# Patient Record
Sex: Female | Born: 1990
Health system: Southern US, Community
[De-identification: ages and names within clinical notes are randomized; demographics above are authoritative.]

## PROBLEM LIST (undated history)

## (undated) DIAGNOSIS — G809 Cerebral palsy, unspecified: Secondary | ICD-10-CM

## (undated) DIAGNOSIS — L309 Dermatitis, unspecified: Secondary | ICD-10-CM

## (undated) DIAGNOSIS — Z9989 Dependence on other enabling machines and devices: Secondary | ICD-10-CM

## (undated) DIAGNOSIS — R569 Unspecified convulsions: Secondary | ICD-10-CM

## (undated) HISTORY — PX: KNEE SURGERY: SHX244

## (undated) HISTORY — DX: Unspecified convulsions: R56.9

## (undated) HISTORY — PX: OTHER SURGICAL HISTORY: SHX169

## (undated) HISTORY — PX: HIP SURGERY: SHX245

## (undated) HISTORY — DX: Cerebral palsy, unspecified: G80.9

---

## 2015-05-11 ENCOUNTER — Encounter: Payer: Self-pay | Admitting: Family

## 2015-05-11 ENCOUNTER — Ambulatory Visit (INDEPENDENT_AMBULATORY_CARE_PROVIDER_SITE_OTHER): Payer: BC Managed Care – PPO | Admitting: Family

## 2015-05-11 VITALS — BP 124/68 | HR 108 | Temp 98.5°F | Resp 18 | Wt 99.0 lb

## 2015-05-11 DIAGNOSIS — J069 Acute upper respiratory infection, unspecified: Secondary | ICD-10-CM | POA: Diagnosis not present

## 2015-05-11 MED ORDER — CEFUROXIME AXETIL 250 MG PO TABS: 250.0000 mg | ORAL_TABLET | Freq: Two times a day (BID) | ORAL | Status: DC

## 2015-05-11 MED ORDER — PROMETHAZINE-DM 6.25-15 MG/5ML PO SYRP
5.0000 mL | ORAL_SOLUTION | Freq: Four times a day (QID) | ORAL | Status: DC | PRN
Start: 2015-05-11 — End: 2015-06-11

## 2015-05-11 NOTE — Progress Notes (Signed)
Pre visit review using our clinic review tool, if applicable. No additional management support is needed unless otherwise documented below in the visit note. 

## 2015-05-11 NOTE — Assessment & Plan Note (Signed)
Symptoms and exam consistent with acute upper respiratory infection. Start Ceftin. Start Promethazine DM as needed for cough and sleep. Continue over-the-counter medications as needed for symptom relief and supportive care. Follow up if symptoms worsen or fail to improve.

## 2015-05-11 NOTE — Patient Instructions (Signed)
Thank you for choosing [facility].  Summary/Instructions:  Your prescription(s) have been submitted to your pharmacy or been printed and provided for you. Please take as directed and contact our office if you believe you are having problem(s) with the medication(s) or have any questions.  If your symptoms worsen or fail to improve, please contact our office for further instruction, or in case of emergency go directly to the emergency room at the closest medical facility.    Upper Respiratory Infection, Adult An upper respiratory infection (URI) is also sometimes known as the common cold. The upper respiratory tract includes the nose, sinuses, throat, trachea, and bronchi. Bronchi are the airways leading to the lungs. Most people improve within 1 week, but symptoms can last up to 2 weeks. A residual cough may last even longer.  CAUSES Many different viruses can infect the tissues lining the upper respiratory tract. The tissues become irritated and inflamed and often become very moist. Mucus production is also common. A cold is contagious. You can easily spread the virus to others by oral contact. This includes kissing, sharing a glass, coughing, or sneezing. Touching your mouth or nose and then touching a surface, which is then touched by another person, can also spread the virus. SYMPTOMS  Symptoms typically develop 1 to 3 days after you come in contact with a cold virus. Symptoms vary from person to person. They may include:  Runny nose.  Sneezing.  Nasal congestion.  Sinus irritation.  Sore throat.  Loss of voice (laryngitis).  Cough.  Fatigue.  Muscle aches.  Loss of appetite.  Headache.  Low-grade fever. DIAGNOSIS  You might diagnose your own cold based on familiar symptoms, since most people get a cold 2 to 3 times a year. Your caregiver can confirm this based on your exam. Most importantly, your caregiver can check that your symptoms are not due to another  disease such as strep throat, sinusitis, pneumonia, asthma, or epiglottitis. Blood tests, throat tests, and X-rays are not necessary to diagnose a common cold, but they may sometimes be helpful in excluding other more serious diseases. Your caregiver will decide if any further tests are required. RISKS AND COMPLICATIONS  You may be at risk for a more severe case of the common cold if you smoke cigarettes, have chronic heart disease (such as heart failure) or lung disease (such as asthma), or if you have a weakened immune system. The very young and very old are also at risk for more serious infections. Bacterial sinusitis, middle ear infections, and bacterial pneumonia can complicate the common cold. The common cold can worsen asthma and chronic obstructive pulmonary disease (COPD). Sometimes, these complications can require emergency medical care and may be life-threatening. PREVENTION  The best way to protect against getting a cold is to practice good hygiene. Avoid oral or hand contact with people with cold symptoms. Wash your hands often if contact occurs. There is no clear evidence that vitamin C, vitamin E, echinacea, or exercise reduces the chance of developing a cold. However, it is always recommended to get plenty of rest and practice good nutrition. TREATMENT  Treatment is directed at relieving symptoms. There is no cure. Antibiotics are not effective, because the infection is caused by a virus, not by bacteria. Treatment may include:  Increased fluid intake. Sports drinks offer valuable electrolytes, sugars, and fluids.  Breathing heated mist or steam (vaporizer or shower).  Eating chicken soup or other clear broths, and maintaining good nutrition.  Getting plenty of   rest.  Using gargles or lozenges for comfort.  Controlling fevers with ibuprofen or acetaminophen as directed by your caregiver.  Increasing usage of your inhaler if you have asthma. Zinc gel and zinc lozenges, taken in  the first 24 hours of the common cold, can shorten the duration and lessen the severity of symptoms. Pain medicines may help with fever, muscle aches, and throat pain. A variety of non-prescription medicines are available to treat congestion and runny nose. Your caregiver can make recommendations and may suggest nasal or lung inhalers for other symptoms.  HOME CARE INSTRUCTIONS   Only take over-the-counter or prescription medicines for pain, discomfort, or fever as directed by your caregiver.  Use a warm mist humidifier or inhale steam from a shower to increase air moisture. This may keep secretions moist and make it easier to breathe.  Drink enough water and fluids to keep your urine clear or pale yellow.  Rest as needed.  Return to work when your temperature has returned to normal or as your caregiver advises. You may need to stay home longer to avoid infecting others. You can also use a face mask and careful hand washing to prevent spread of the virus. SEEK MEDICAL CARE IF:   After the first few days, you feel you are getting worse rather than better.  You need your caregiver's advice about medicines to control symptoms.  You develop chills, worsening shortness of breath, or brown or red sputum. These may be signs of pneumonia.  You develop yellow or brown nasal discharge or pain in the face, especially when you bend forward. These may be signs of sinusitis.  You develop a fever, swollen neck glands, pain with swallowing, or white areas in the back of your throat. These may be signs of strep throat. SEEK IMMEDIATE MEDICAL CARE IF:   You have a fever.  You develop severe or persistent headache, ear pain, sinus pain, or chest pain.  You develop wheezing, a prolonged cough, cough up blood, or have a change in your usual mucus (if you have chronic lung disease).  You develop sore muscles or a stiff neck. Document Released: 05/16/2001 Document Revised: 02/12/2012 Document Reviewed:  02/25/2014 Exit[employer] Patient Information 2015 [employer]. This information is not intended to replace advice given to you by your health care provider. Make sure you discuss any questions you have with your health care provider.   

## 2015-05-11 NOTE — Progress Notes (Signed)
Subjective:    Patient ID: Kerri Harris, female    DOB: [birth year], 24 y.o.   MRN: [id number]  Chief Complaint  Patient presents with  . Establish Care    x2 weeks, coughing, sore throat, congestion    HPI:  Kerri Harris is a 24 y.o. female with a PMH of seizures and cerebral palsy who presents today for an office visit to establish care.     1.) Illness - Associated symptoms of coughing, sore throat and congestion has been going on for about 2 weeks. Modifying factors include dayquil, nyquil and Hall's with minimal relief. Timing of the symptoms are worse at night. Severity of the symptoms are enough to keep her up at night. Over the course of the 2 weeks her symptoms have progressively worsened.   No Known Allergies   No outpatient prescriptions prior to visit.   No facility-administered medications prior to visit.     Past Medical History  Diagnosis Date  . Cerebral palsy   . Seizures     Childhood seizures - not since age 35     Past Surgical History  Procedure Laterality Date  . Hip surgery Bilateral   . Feet surgery Bilateral   . Knee surgery Bilateral      Family History  Problem Relation Age of Onset  . Arthritis Mother   . Hypertension Maternal Grandmother   . Diabetes Maternal Grandmother      History   Social History  . Marital Status: Single    Spouse Name: N/A  . Number of Children: 0  . Years of Education: 12   Occupational History  . Not on file.   Social History Main Topics  . Smoking status: Never Smoker   . Smokeless tobacco: Never Used  . Alcohol Use: No  . Drug Use: No  . Sexual Activity: Not on file   Other Topics Concern  . Not on file   Social History Narrative   Fun: Play and exercise   Denies religious beliefs effecting health care.     Review of Systems  Constitutional: Negative for fever and chills.  HENT: Positive for congestion and sore throat. Negative for ear pain and sinus pressure.   Respiratory:  Positive for chest tightness and shortness of breath.   Neurological: Positive for headaches.      Objective:    BP 124/68 mmHg  Pulse 108  Temp(Src) 98.5 F (36.9 C) (Oral)  Resp 18  Wt 99 lb (44.906 kg)  SpO2 99% Nursing note and vital signs reviewed.  Physical Exam  Constitutional: She is oriented to person, place, and time. She appears well-developed and well-nourished. No distress.  HENT:  Right Ear: Hearing, tympanic membrane, external ear and ear canal normal.  Left Ear: Hearing, tympanic membrane, external ear and ear canal normal.  Nose: Right sinus exhibits no maxillary sinus tenderness and no frontal sinus tenderness. Left sinus exhibits no maxillary sinus tenderness and no frontal sinus tenderness.  Mouth/Throat: Uvula is midline, oropharynx is clear and moist and mucous membranes are normal.  Cardiovascular: Normal rate, regular rhythm, normal heart sounds and intact distal pulses.   Pulmonary/Chest: Effort normal and breath sounds normal.  Neurological: She is alert and oriented to person, place, and time.  Skin: Skin is warm and dry.  Psychiatric: She has a normal mood and affect. Her behavior is normal. Judgment and thought content normal.       Assessment & Plan:   Problem List Items Addressed This Visit  Respiratory   Acute upper respiratory infection - Primary    Symptoms and exam consistent with acute upper respiratory infection. Start Ceftin. Start Promethazine DM as needed for cough and sleep. Continue over-the-counter medications as needed for symptom relief and supportive care. Follow up if symptoms worsen or fail to improve.      Relevant Medications   cefUROXime (CEFTIN) 250 MG tablet   promethazine-dextromethorphan (PROMETHAZINE-DM) 6.25-15 MG/5ML syrup

## 2015-06-11 ENCOUNTER — Encounter: Payer: Self-pay | Admitting: Family

## 2015-06-11 ENCOUNTER — Ambulatory Visit: Payer: Self-pay | Admitting: Internal Medicine

## 2015-06-11 ENCOUNTER — Other Ambulatory Visit (INDEPENDENT_AMBULATORY_CARE_PROVIDER_SITE_OTHER): Payer: BC Managed Care – PPO

## 2015-06-11 ENCOUNTER — Ambulatory Visit (INDEPENDENT_AMBULATORY_CARE_PROVIDER_SITE_OTHER): Payer: BC Managed Care – PPO | Admitting: Family

## 2015-06-11 VITALS — BP 130/72 | HR 81 | Temp 98.2°F | Resp 18 | Ht 59.0 in | Wt 99.0 lb

## 2015-06-11 DIAGNOSIS — F911 Conduct disorder, childhood-onset type: Secondary | ICD-10-CM

## 2015-06-11 DIAGNOSIS — Z742 Need for assistance at home and no other household member able to render care: Secondary | ICD-10-CM | POA: Diagnosis not present

## 2015-06-11 DIAGNOSIS — R454 Irritability and anger: Secondary | ICD-10-CM | POA: Insufficient documentation

## 2015-06-11 DIAGNOSIS — Z0000 Encounter for general adult medical examination without abnormal findings: Secondary | ICD-10-CM | POA: Insufficient documentation

## 2015-06-11 LAB — CBC
HCT: 38.8 % (ref 36.0–46.0)
Hemoglobin: 12.8 g/dL (ref 12.0–15.0)
MCHC: 32.9 g/dL (ref 30.0–36.0)
MCV: 84.6 fl (ref 78.0–100.0)
PLATELETS: 324 10*3/uL (ref 150.0–400.0)
RBC: 4.59 Mil/uL (ref 3.87–5.11)
RDW: 13.7 % (ref 11.5–15.5)
WBC: 8.3 10*3/uL (ref 4.0–10.5)

## 2015-06-11 LAB — BASIC METABOLIC PANEL
BUN: 8 mg/dL (ref 6–23)
CO2: 25 mEq/L (ref 19–32)
CREATININE: 0.59 mg/dL (ref 0.40–1.20)
Calcium: 9.7 mg/dL (ref 8.4–10.5)
Chloride: 105 mEq/L (ref 96–112)
GFR: 131.91 mL/min (ref >60.00)
Glucose, Bld: 94 mg/dL (ref 70–99)
Potassium: 3.5 mEq/L (ref 3.5–5.1)
Sodium: 138 mEq/L (ref 135–145)

## 2015-06-11 LAB — TSH: TSH: 1.69 u[IU]/mL (ref 0.35–4.50)

## 2015-06-11 NOTE — Assessment & Plan Note (Signed)
1) Anticipatory Guidance: Discussed importance of wearing a seatbelt while driving and not texting while driving; changing batteries in smoke detector at least once annually; wearing suntan lotion when outside; eating a balanced and moderate diet; getting physical activity at least 30 minutes per day.  2) Immunizations / Screenings / Labs:  Immunizations will be checked on by her mother. Due for dental exam which will be scheduled independently. All other screenings are up to date per recommendations. Obtain CBC, TSH and BMET. Obtain lipid profile when fasting.   Overall well exam and patient with cerebral palsy. Minimal risk factors for cardiovascular disease at this time. Continue current healthy choices. Follow up office visit pending lab work and follow up prevention exam in 1 year.

## 2015-06-11 NOTE — Assessment & Plan Note (Signed)
Mother describes behavioral outbursts that are most likely related to frustration. Discussed importance of patience and redirection. Refer to psychology to assist with counseling of patient and family. There is no evidence of abuse at this time.

## 2015-06-11 NOTE — Progress Notes (Signed)
Pre visit review using our clinic review tool, if applicable. No additional management support is needed unless otherwise documented below in the visit note. 

## 2015-06-11 NOTE — Assessment & Plan Note (Signed)
Family expresses concern for patient safety when they are not at home and inability to perform activities of daily living. She can perform most activities of daily living, however requires assistance in completing. There is concern for her safety and judgement making abilities when there is no one present. It would be recommended for a home aid to assist in care of the patient to optimize safety and prevent injury.

## 2015-06-11 NOTE — Progress Notes (Signed)
Subjective:    Patient ID: Kerri Harris, female    DOB: Feb 14, 1991, 24 y.o.   MRN: [id number]  Chief Complaint  Patient presents with  . CPE    not fasting    HPI:  Kerri Harris is a 24 y.o. female who presents today for an annual wellness visit.   1) Health Maintenance -   Diet - Averages 3 meals per day consisting of vegetables, meats, starches, fruits; Denies caffeine intake.   Exercise - Walks with her walker on a daily basis.    2) Preventative Exams / Immunizations:  Dental -- Due for exam  Vision -- Due for exam    Health Maintenance  Topic Date Due  . HIV Screening  05/09/2005  . PAP SMEAR  05/09/2008  . TETANUS/TDAP  05/09/2009  . INFLUENZA VACCINE  07/05/2015   3.) In home services - Baths with assistance, eats without assistance, assistance with food preparation, assistance with dressing, and toileting. Family works outside the home and there is no one there to be with her to assist her with activities of daily living. She does move around the house with the assistance of walker without additional assistance. There is concern for her safety and decision making when there is no one around.   There is no immunization history on file for this patient.  No Known Allergies   Outpatient Prescriptions Prior to Visit  Medication Sig Dispense Refill  . cefUROXime (CEFTIN) 250 MG tablet Take 1 tablet (250 mg total) by mouth 2 (two) times daily with a meal. 20 tablet 0  . promethazine-dextromethorphan (PROMETHAZINE-DM) 6.25-15 MG/5ML syrup Take 5 mLs by mouth 4 (four) times daily as needed. 118 mL 0   No facility-administered medications prior to visit.     Past Medical History  Diagnosis Date  . Cerebral palsy   . Seizures     Childhood seizures - not since age 62     Past Surgical History  Procedure Laterality Date  . Hip surgery Bilateral   . Feet surgery Bilateral   . Knee surgery Bilateral      Family History  Problem Relation Age of Onset   . Arthritis Mother   . Hypertension Maternal Grandmother   . Diabetes Maternal Grandmother      History   Social History  . Marital Status: Single    Spouse Name: N/A  . Number of Children: 0  . Years of Education: 12   Occupational History  . Not on file.   Social History Main Topics  . Smoking status: Never Smoker   . Smokeless tobacco: Never Used  . Alcohol Use: No  . Drug Use: No  . Sexual Activity: Not on file   Other Topics Concern  . Not on file   Social History Narrative   Fun: Play and exercise   Denies religious beliefs effecting health care.      Review of Systems  Constitutional: Denies fever, chills, fatigue, or significant weight gain/loss. HENT: Head: Denies headache or neck pain Ears: Denies changes in hearing, ringing in ears, earache, drainage Nose: Denies discharge, stuffiness, itching, nosebleed, sinus pain Throat: Denies sore throat, hoarseness, dry mouth, sores, thrush Eyes: Denies loss/changes in vision, pain, redness, blurry/double vision, flashing lights Cardiovascular: Denies chest pain/discomfort, tightness, palpitations, shortness of breath with activity, difficulty lying down, swelling, sudden awakening with shortness of breath Respiratory: Denies shortness of breath, cough, sputum production, wheezing Gastrointestinal: Denies dysphasia, heartburn, change in appetite, nausea, change in bowel habits, rectal  bleeding, constipation, diarrhea, yellow skin or eyes Genitourinary: Denies frequency, urgency, burning/pain, blood in urine, incontinence, change in urinary strength. Musculoskeletal: Denies muscle/joint pain,  back pain, redness or swelling of joints, trauma Lower joint stiffness secondary to cerebral palsy Skin: Denies rashes, lumps, itching, dryness, color changes, or hair/nail changes Neurological: Denies dizziness, fainting, seizures, weakness, numbness, tingling, tremor Psychiatric - Denies nervousness, stress, depression or  memory loss Behavioral outbursts Endocrine: Denies heat or cold intolerance, sweating, frequent urination, excessive thirst, changes in appetite Hematologic: Denies ease of bruising or bleeding     Objective:    BP 130/72 mmHg  Pulse 81  Temp(Src) 98.2 F (36.8 C) (Oral)  Resp 18  Ht 4\' 11"  (1.499 m)  Wt 99 lb (44.906 kg)  BMI 19.98 kg/m2  SpO2 99% Nursing note and vital signs reviewed.  Physical Exam  Constitutional: She is oriented to person, place, and time. She appears well-developed and well-nourished.  HENT:  Head: Normocephalic.  Right Ear: Hearing, tympanic membrane, external ear and ear canal normal.  Left Ear: Hearing, tympanic membrane, external ear and ear canal normal.  Nose: Nose normal.  Mouth/Throat: Uvula is midline, oropharynx is clear and moist and mucous membranes are normal.  Increased cerumen noted bilaterally  Eyes: Conjunctivae and EOM are normal. Pupils are equal, round, and reactive to light.  Neck: Neck supple. No JVD present. No tracheal deviation present. No thyromegaly present.  Cardiovascular: Normal rate, regular rhythm, normal heart sounds and intact distal pulses.   Pulmonary/Chest: Effort normal and breath sounds normal.  Abdominal: Soft. Bowel sounds are normal. She exhibits no distension and no mass. There is no tenderness. There is no rebound and no guarding.  Musculoskeletal: Normal range of motion. She exhibits no edema or tenderness.  Lower extremity muscle stiffness in bilateral knees with some limited range of motion.   Lymphadenopathy:    She has no cervical adenopathy.  Neurological: She is alert and oriented to person, place, and time. She has normal reflexes. No cranial nerve deficit. She exhibits normal muscle tone. Coordination normal.  Skin: Skin is warm and dry.  Psychiatric: She has a normal mood and affect. Her behavior is normal. Judgment and thought content normal.       Assessment & Plan:   Problem List Items  Addressed This Visit      Other   Routine general medical examination at a health care facility - Primary    1) Anticipatory Guidance: Discussed importance of wearing a seatbelt while driving and not texting while driving; changing batteries in smoke detector at least once annually; wearing suntan lotion when outside; eating a balanced and moderate diet; getting physical activity at least 30 minutes per day.  2) Immunizations / Screenings / Labs:  Immunizations will be checked on by her mother. Due for dental exam which will be scheduled independently. All other screenings are up to date per recommendations. Obtain CBC, TSH and BMET. Obtain lipid profile when fasting.   Overall well exam and patient with cerebral palsy. Minimal risk factors for cardiovascular disease at this time. Continue current healthy choices. Follow up office visit pending lab work and follow up prevention exam in 1 year.       Relevant Orders   Basic metabolic panel   CBC   TSH   Outbursts of anger    Mother describes behavioral outbursts that are most likely related to frustration. Discussed importance of patience and redirection. Refer to psychology to assist with counseling of patient and family.  There is no evidence of abuse at this time.       Relevant Orders   Ambulatory referral to Psychology   Needs assistance while at home    Family expresses concern for patient safety when they are not at home and inability to perform activities of daily living. She can perform most activities of daily living, however requires assistance in completing. There is concern for her safety and judgement making abilities when there is no one present. It would be recommended for a home aid to assist in care of the patient to optimize safety and prevent injury.

## 2015-06-11 NOTE — Patient Instructions (Signed)
Thank you for choosing [facility].  Summary/Instructions:  Please stop by the lab on the basement level of the building for your blood work. Your results will be released to MyChart (or called to you) after review, usually within 72hours after test completion. If any changes need to be made, you will be notified at that same time.  Referrals have been made during this visit. You should expect to hear back from our schedulers in about 7-10 days in regards to establishing an appointment with the specialists we discussed.   Health Maintenance Adopting a healthy lifestyle and getting preventive care can go a long way to promote health and wellness. Talk with your health care provider about what schedule of regular examinations is right for you. This is a good chance for you to check in with your provider about disease prevention and staying healthy. In between checkups, there are plenty of things you can do on your own. Experts have done a lot of research about which lifestyle changes and preventive measures are most likely to keep you healthy. Ask your health care provider for more information. WEIGHT AND DIET  Eat a healthy diet  Be sure to include plenty of vegetables, fruits, low-fat dairy products, and lean protein.  Do not eat a lot of foods high in solid fats, added sugars, or salt.  Get regular exercise. This is one of the most important things you can do for your health.  Most adults should exercise for at least 150 minutes each week. The exercise should increase your heart rate and make you sweat (moderate-intensity exercise).  Most adults should also do strengthening exercises at least twice a week. This is in addition to the moderate-intensity exercise.  Maintain a healthy weight  Body mass index (BMI) is a measurement that can be used to identify possible weight problems. It estimates body fat based on height and weight. Your health care provider can help determine your BMI  and help you achieve or maintain a healthy weight.  For females 20 years of age and older:   A BMI below 18.5 is considered underweight.  A BMI of 18.5 to 24.9 is normal.  A BMI of 25 to 29.9 is considered overweight.  A BMI of 30 and above is considered obese.  Watch levels of cholesterol and blood lipids  You should start having your blood tested for lipids and cholesterol at 25 years of age, then have this test every 5 years.  You may need to have your cholesterol levels checked more often if:  Your lipid or cholesterol levels are high.  You are older than 25 years of age.  You are at high risk for heart disease.  CANCER SCREENING   Lung Cancer  Lung cancer screening is recommended for adults 55-80 years old who are at high risk for lung cancer because of a history of smoking.  A yearly low-dose CT scan of the lungs is recommended for people who:  Currently smoke.  Have quit within the past 15 years.  Have at least a 30-pack-year history of smoking. A pack year is smoking an average of one pack of cigarettes a day for 1 year.  Yearly screening should continue until it has been 15 years since you quit.  Yearly screening should stop if you develop a health problem that would prevent you from having lung cancer treatment.  Breast Cancer  Practice breast self-awareness. This means understanding how your breasts normally appear and feel.  It also means   doing regular breast self-exams. Let your health care provider know about any changes, no matter how small.  If you are in your 20s or 30s, you should have a clinical breast exam (CBE) by a health care provider every 1-3 years as part of a regular health exam.  If you are 40 or older, have a CBE every year. Also consider having a breast X-ray (mammogram) every year.  If you have a family history of breast cancer, talk to your health care provider about genetic screening.  If you are at high risk for breast cancer,  talk to your health care provider about having an MRI and a mammogram every year.  Breast cancer gene (BRCA) assessment is recommended for women who have family members with BRCA-related cancers. BRCA-related cancers include:  Breast.  Ovarian.  Tubal.  Peritoneal cancers.  Results of the assessment will determine the need for genetic counseling and BRCA1 and BRCA2 testing. Cervical Cancer Routine pelvic examinations to screen for cervical cancer are no longer recommended for nonpregnant women who are considered low risk for cancer of the pelvic organs (ovaries, uterus, and vagina) and who do not have symptoms. A pelvic examination may be necessary if you have symptoms including those associated with pelvic infections. Ask your health care provider if a screening pelvic exam is right for you.   The Pap test is the screening test for cervical cancer for women who are considered at risk.  If you had a hysterectomy for a problem that was not cancer or a condition that could lead to cancer, then you no longer need Pap tests.  If you are older than 65 years, and you have had normal Pap tests for the past 10 years, you no longer need to have Pap tests.  If you have had past treatment for cervical cancer or a condition that could lead to cancer, you need Pap tests and screening for cancer for at least 20 years after your treatment.  If you no longer get a Pap test, assess your risk factors if they change (such as having a new sexual partner). This can affect whether you should start being screened again.  Some women have medical problems that increase their chance of getting cervical cancer. If this is the case for you, your health care provider may recommend more frequent screening and Pap tests.  The human papillomavirus (HPV) test is another test that may be used for cervical cancer screening. The HPV test looks for the virus that can cause cell changes in the cervix. The cells collected  during the Pap test can be tested for HPV.  The HPV test can be used to screen women 30 years of age and older. Getting tested for HPV can extend the interval between normal Pap tests from three to five years.  An HPV test also should be used to screen women of any age who have unclear Pap test results.  After 25 years of age, women should have HPV testing as often as Pap tests.  Colorectal Cancer  This type of cancer can be detected and often prevented.  Routine colorectal cancer screening usually begins at 25 years of age and continues through 25 years of age.  Your health care provider may recommend screening at an earlier age if you have risk factors for colon cancer.  Your health care provider may also recommend using home test kits to check for hidden blood in the stool.  A small camera at the end of a   tube can be used to examine your colon directly (sigmoidoscopy or colonoscopy). This is done to check for the earliest forms of colorectal cancer.  Routine screening usually begins at age 50.  Direct examination of the colon should be repeated every 5-10 years through 25 years of age. However, you may need to be screened more often if early forms of precancerous polyps or small growths are found. Skin Cancer  Check your skin from head to toe regularly.  Tell your health care provider about any new moles or changes in moles, especially if there is a change in a mole's shape or color.  Also tell your health care provider if you have a mole that is larger than the size of a pencil eraser.  Always use sunscreen. Apply sunscreen liberally and repeatedly throughout the day.  Protect yourself by wearing long sleeves, pants, a wide-brimmed hat, and sunglasses whenever you are outside. HEART DISEASE, DIABETES, AND HIGH BLOOD PRESSURE   Have your blood pressure checked at least every 1-2 years. High blood pressure causes heart disease and increases the risk of stroke.  If you are  between 55 years and 79 years old, ask your health care provider if you should take aspirin to prevent strokes.  Have regular diabetes screenings. This involves taking a blood sample to check your fasting blood sugar level.  If you are at a normal weight and have a low risk for diabetes, have this test once every three years after 25 years of age.  If you are overweight and have a high risk for diabetes, consider being tested at a younger age or more often. PREVENTING INFECTION  Hepatitis B  If you have a higher risk for hepatitis B, you should be screened for this virus. You are considered at high risk for hepatitis B if:  You were born in a country where hepatitis B is common. Ask your health care provider which countries are considered high risk.  Your parents were born in a high-risk country, and you have not been immunized against hepatitis B (hepatitis B vaccine).  You have HIV or AIDS.  You use needles to inject street drugs.  You live with someone who has hepatitis B.  You have had sex with someone who has hepatitis B.  You get hemodialysis treatment.  You take certain medicines for conditions, including cancer, organ transplantation, and autoimmune conditions. Hepatitis C  Blood testing is recommended for:  Everyone born from 1945 through 1965.  Anyone with known risk factors for hepatitis C. Sexually transmitted infections (STIs)  You should be screened for sexually transmitted infections (STIs) including gonorrhea and chlamydia if:  You are sexually active and are younger than 24 years of age.  You are older than 24 years of age and your health care provider tells you that you are at risk for this type of infection.  Your sexual activity has changed since you were last screened and you are at an increased risk for chlamydia or gonorrhea. Ask your health care provider if you are at risk.  If you do not have HIV, but are at risk, it may be recommended that you  take a prescription medicine daily to prevent HIV infection. This is called pre-exposure prophylaxis (PrEP). You are considered at risk if:  You are sexually active and do not regularly use condoms or know the HIV status of your partner(s).  You take drugs by injection.  You are sexually active with a partner who has HIV. Talk with   Talk with your health care provider about whether you are at high risk of being infected with HIV. If you choose to begin PrEP, you should first be tested for HIV. You should then be tested every 3 months for as long as you are taking PrEP.  PREGNANCY   If you are premenopausal and you may become pregnant, ask your health care provider about preconception counseling.  If you may become pregnant, take 400 to 800 micrograms (mcg) of folic acid every day.  If you want to prevent pregnancy, talk to your health care provider about birth control (contraception). OSTEOPOROSIS AND MENOPAUSE   Osteoporosis is a disease in which the bones lose minerals and strength with aging. This can result in serious bone fractures. Your risk for osteoporosis can be identified using a bone density scan.  If you are 65 years of age or older, or if you are at risk for osteoporosis and fractures, ask your health care provider if you should be screened.  Ask your health care provider whether you should take a calcium or vitamin D supplement to lower your risk for osteoporosis.  Menopause may have certain physical symptoms and risks.  Hormone replacement therapy may reduce some of these symptoms and risks. Talk to your health care provider about whether hormone replacement therapy is right for you.  HOME CARE INSTRUCTIONS   Schedule regular health, dental, and eye exams.  Stay current with your immunizations.   Do not use any tobacco products including cigarettes, chewing tobacco, or electronic cigarettes.  If you are pregnant, do not drink alcohol.  If you are breastfeeding, limit how  much and how often you drink alcohol.  Limit alcohol intake to no more than 1 drink per day for nonpregnant women. One drink equals 12 ounces of beer, 5 ounces of wine, or 1 ounces of hard liquor.  Do not use street drugs.  Do not share needles.  Ask your health care provider for help if you need support or information about quitting drugs.  Tell your health care provider if you often feel depressed.  Tell your health care provider if you have ever been abused or do not feel safe at home. Document Released: 06/05/2011 Document Revised: 04/06/2014 Document Reviewed: 10/22/2013 Exit[employer] Patient Information 2015 [employer]. This information is not intended to replace advice given to you by your health care provider. Make sure you discuss any questions you have with your health care provider.  

## 2015-06-14 ENCOUNTER — Telehealth: Payer: Self-pay | Admitting: Family

## 2015-06-14 NOTE — Telephone Encounter (Signed)
Please inform patient that her white/red blood cells, thyroid function, kidney function and electrolytes are all within the normal limits.

## 2015-06-15 NOTE — Telephone Encounter (Signed)
Pt aware.

## 2015-06-22 ENCOUNTER — Telehealth: Payer: Self-pay | Admitting: Family

## 2015-06-22 NOTE — Telephone Encounter (Signed)
Patient has bcbs and mcd. [facility] was referred to [facility]. They are not in network with mcd. The mother is advising that the facility we refer to needs to accept mcd as well. How would we go about [facility]?

## 2015-07-08 ENCOUNTER — Encounter: Payer: Self-pay | Admitting: Family

## 2015-07-08 ENCOUNTER — Ambulatory Visit (INDEPENDENT_AMBULATORY_CARE_PROVIDER_SITE_OTHER): Payer: BC Managed Care – PPO | Admitting: Family

## 2015-07-08 VITALS — BP 120/80 | HR 105 | Temp 98.3°F | Resp 20 | Ht 59.0 in | Wt 97.0 lb

## 2015-07-08 DIAGNOSIS — R059 Cough, unspecified: Secondary | ICD-10-CM

## 2015-07-08 DIAGNOSIS — R05 Cough: Secondary | ICD-10-CM | POA: Diagnosis not present

## 2015-07-08 MED ORDER — LEVOFLOXACIN 500 MG PO TABS
500.0000 mg | ORAL_TABLET | Freq: Every day | ORAL | Status: DC
Start: 2015-07-08 — End: 2016-07-20

## 2015-07-08 MED ORDER — FLUTICASONE FUROATE-VILANTEROL 100-25 MCG/INH IN AEPB: 1.0000 | INHALATION_SPRAY | Freq: Every day | RESPIRATORY_TRACT | Status: DC | PRN

## 2015-07-08 MED ORDER — HYDROCODONE-HOMATROPINE 5-1.5 MG/5ML PO SYRP: 5.0000 mL | ORAL_SOLUTION | Freq: Three times a day (TID) | ORAL | Status: DC | PRN

## 2015-07-08 NOTE — Patient Instructions (Signed)
Thank you for choosing [facility].  Summary/Instructions:  Your prescription(s) have been submitted to your pharmacy or been printed and provided for you. Please take as directed and contact our office if you believe you are having problem(s) with the medication(s) or have any questions.  If your symptoms worsen or fail to improve, please contact our office for further instruction, or in case of emergency go directly to the emergency room at the closest medical facility.   General Recommendations:    Please drink plenty of fluids.  Get plenty of rest   Sleep in humidified air  Use saline nasal sprays  Netti pot   OTC Medications:  Decongestants - helps relieve congestion   Flonase (generic fluticasone) or Nasacort (generic triamcinolone) - please make sure to use the "cross-over" technique at a 45 degree angle towards the opposite eye as opposed to straight up the nasal passageway.   Sudafed (generic pseudoephedrine - Note this is the one that is available behind the pharmacy counter); Products with phenylephrine (-PE) may also be used but is often not as effective as pseudoephedrine.   If you have HIGH BLOOD PRESSURE - Coricidin HBP; AVOID any product that is -D as this contains pseudoephedrine which may increase your blood pressure.  Afrin (oxymetazoline) every 6-8 hours for up to 3 days.   Allergies - helps relieve runny nose, itchy eyes and sneezing   Claritin (generic loratidine), Allegra (fexofenidine), or Zyrtec (generic cyrterizine) for runny nose. These medications should not cause drowsiness.  Note - Benadryl (generic diphenhydramine) may be used however may cause drowsiness  Cough -   Delsym or Robitussin (generic dextromethorphan)  Expectorants - helps loosen mucus to ease removal   Mucinex (generic guaifenesin) as directed on the package.  Headaches / General Aches   Tylenol (generic acetaminophen) - DO NOT EXCEED 3 grams (3,000 mg) in a 24  hour time period  Advil/Motrin (generic ibuprofen)   Sore Throat -   Salt water gargle   Chloraseptic (generic benzocaine) spray or lozenges / Sucrets (generic dyclonine)      

## 2015-07-08 NOTE — Assessment & Plan Note (Signed)
Cough of underdetermined origin cannot rule out bronchitis. Start levaquin to cover for infection. Start Breo as needed for airway inflammation. Start hycodan as needed for cough and sleep. Continue over the counter medication as needed for symptom relief and supportive care. Follow up if symptoms worsen or fail to improve.

## 2015-07-08 NOTE — Progress Notes (Signed)
   Subjective:    Patient ID: Kerri Harris, female    DOB: [birth year], 24 y.o.   MRN: [id number]  Chief Complaint  Patient presents with  . Cough    has a really bad cough and saw blood in her mucus yesterday when she was coughing, SOB, passed out over a week ago from SOB and dizziness    HPI:  Kerri Harris is a 24 y.o. female with a PMH of cerebral palsy and seizures who presents today for an acute office visit.     1.) Cough - Associated symptom of a cough has been going on for about a month.  Indicates that she saw some blood in her mucus yesterday and the severity of her symptoms of coughing and SOB resulted in her passing out.  Modifying factors include cough syrup and she was previously treated with ceftin which originally helped. Symptoms are consistent throughout the day and worse at night. Dizziness occurs when she gets headaches. Is not currently experiencing the dizziness.   No Known Allergies   No current outpatient prescriptions on file prior to visit.   No current facility-administered medications on file prior to visit.    Review of Systems  Constitutional: Negative for fever and chills.  HENT: Positive for congestion. Negative for sinus pressure and sore throat.   Respiratory: Positive for cough.   Neurological: Positive for headaches.      Objective:    BP 120/80 mmHg  Pulse 105  Temp(Src) 98.3 F (36.8 C) (Oral)  Resp 20  Ht  (1.499 m)  Wt 97 lb (43.999 kg)  BMI 19.58 kg/m2  SpO2 99% Nursing note and vital signs reviewed.  Physical Exam  Constitutional: She is oriented to person, place, and time. She appears well-developed and well-nourished. No distress.  HENT:  Right Ear: Hearing, tympanic membrane, external ear and ear canal normal.  Left Ear: Hearing, tympanic membrane, external ear and ear canal normal.  Nose: Right sinus exhibits no maxillary sinus tenderness and no frontal sinus tenderness. Left sinus exhibits no maxillary sinus  tenderness and no frontal sinus tenderness.  Mouth/Throat: Uvula is midline, oropharynx is clear and moist and mucous membranes are normal.  Cardiovascular: Normal rate, regular rhythm, normal heart sounds and intact distal pulses.   Pulmonary/Chest: Effort normal. She has wheezes (mild).  Neurological: She is alert and oriented to person, place, and time.  Skin: Skin is warm and dry.  Psychiatric: She has a normal mood and affect. Her behavior is normal. Judgment and thought content normal.       Assessment & Plan:   Problem List Items Addressed This Visit      Other   Cough - Primary    Cough of underdetermined origin cannot rule out bronchitis. Start levaquin to cover for infection. Start Breo as needed for airway inflammation. Start hycodan as needed for cough and sleep. Continue over the counter medication as needed for symptom relief and supportive care. Follow up if symptoms worsen or fail to improve.       Relevant Medications   levofloxacin (LEVAQUIN) 500 MG tablet   Fluticasone Furoate-Vilanterol (BREO ELLIPTA) 100-25 MCG/INH AEPB   HYDROcodone-homatropine (HYCODAN) 5-1.5 MG/5ML syrup

## 2015-07-08 NOTE — Assessment & Plan Note (Deleted)
Cough of underdetermined origin cannot rule out bronchitis. Start levaquin to cover for infection. Start Breo as needed for airway inflammation. Start hycodan as needed for cough and sleep. Continue over the counter medication as needed for symptom relief and supportive care. Follow up if symptoms worsen or fail to improve.  

## 2015-07-08 NOTE — Progress Notes (Signed)
Pre visit review using our clinic review tool, if applicable. No additional management support is needed unless otherwise documented below in the visit note. 

## 2015-07-15 NOTE — Telephone Encounter (Signed)
Spoke w/pts mother. Gave her ph # for [facility].

## 2015-07-15 NOTE — Telephone Encounter (Signed)
Patients mother called to advise that [facility] does not accept mcd. i advised that i would let you know to see if you had any other resources, but to check with the case worker for a possible list of in network providers with medicaid.

## 2015-11-17 ENCOUNTER — Telehealth: Payer: Self-pay

## 2015-11-17 NOTE — Telephone Encounter (Signed)
Outreach to fup on flu; mother working full time during the holidays and is only off on 26th; declines pharmacy or other and prefers to bring her in Per TL; Truddie HiddenLou ok'd the patient to be seen Saturday between 9 and 12; The mother stated she would bring her in at 10am Saturday; the 17th.

## 2015-11-20 ENCOUNTER — Ambulatory Visit: Payer: BC Managed Care – PPO | Admitting: Internal Medicine

## 2016-05-12 ENCOUNTER — Telehealth: Payer: Self-pay | Admitting: Family

## 2016-05-12 NOTE — Telephone Encounter (Signed)
Returned pts mothers call and stated that pt needs to be seen since she has not been seen since last July. Mother refused to bring her until august when she states she is due for her physical. She states that this stuff should have been done last year when she was here for her physical. She was also upset that nobody follow up with the psychologist set up. States that she needs an rx or a note that states that daughter needs home health.

## 2016-05-12 NOTE — Telephone Encounter (Signed)
Pt mother called in and said is requesting home health services. She is needing a referral for home health

## 2016-05-12 NOTE — Telephone Encounter (Signed)
Form will be completed and faxed. Will make copy for her to pick up.

## 2016-05-15 NOTE — Telephone Encounter (Signed)
LVM letting mother of pt know that per [facility] form the pt must be seen within the last 90 days in order for us to fill out form per state regulations. We do have the form filled out pt just needs and OV for form to be sent.

## 2016-07-20 ENCOUNTER — Ambulatory Visit (INDEPENDENT_AMBULATORY_CARE_PROVIDER_SITE_OTHER): Payer: Medicaid Other | Admitting: Family

## 2016-07-20 ENCOUNTER — Other Ambulatory Visit (INDEPENDENT_AMBULATORY_CARE_PROVIDER_SITE_OTHER): Payer: Medicaid Other

## 2016-07-20 ENCOUNTER — Encounter: Payer: Self-pay | Admitting: Family

## 2016-07-20 VITALS — BP 128/82 | HR 98 | Temp 98.6°F | Resp 16 | Ht 59.0 in | Wt 107.0 lb

## 2016-07-20 DIAGNOSIS — Z0000 Encounter for general adult medical examination without abnormal findings: Secondary | ICD-10-CM

## 2016-07-20 DIAGNOSIS — G809 Cerebral palsy, unspecified: Secondary | ICD-10-CM

## 2016-07-20 DIAGNOSIS — Z23 Encounter for immunization: Secondary | ICD-10-CM | POA: Diagnosis not present

## 2016-07-20 LAB — COMPREHENSIVE METABOLIC PANEL
ALT: 12 U/L (ref 0–35)
AST: 16 U/L (ref 0–37)
Albumin: 4.8 g/dL (ref 3.5–5.2)
Alkaline Phosphatase: 47 U/L (ref 39–117)
BUN: 11 mg/dL (ref 6–23)
CO2: 22 meq/L (ref 19–32)
Calcium: 10 mg/dL (ref 8.4–10.5)
Chloride: 103 mEq/L (ref 96–112)
Creatinine, Ser: 0.58 mg/dL (ref 0.40–1.20)
GFR: 133.36 mL/min (ref >60.00)
GLUCOSE: 94 mg/dL (ref 70–99)
Potassium: 3.6 mEq/L (ref 3.5–5.1)
Sodium: 135 mEq/L (ref 135–145)
Total Bilirubin: 0.4 mg/dL (ref 0.2–1.2)
Total Protein: 7.7 g/dL (ref 6.0–8.3)

## 2016-07-20 LAB — LIPID PANEL
Cholesterol: 133 mg/dL (ref 0–200)
HDL: 47.2 mg/dL (ref >39.00)
LDL Cholesterol: 67 mg/dL (ref 0–99)
NonHDL: 85.97
TRIGLYCERIDES: 94 mg/dL (ref 0.0–149.0)
Total CHOL/HDL Ratio: 3
VLDL: 18.8 mg/dL (ref 0.0–40.0)

## 2016-07-20 LAB — CBC
HCT: 38.5 % (ref 36.0–46.0)
HEMOGLOBIN: 13 g/dL (ref 12.0–15.0)
MCHC: 33.9 g/dL (ref 30.0–36.0)
MCV: 83.1 fl (ref 78.0–100.0)
PLATELETS: 295 10*3/uL (ref 150.0–400.0)
RBC: 4.63 Mil/uL (ref 3.87–5.11)
RDW: 12.6 % (ref 11.5–15.5)
WBC: 5.3 10*3/uL (ref 4.0–10.5)

## 2016-07-20 NOTE — Progress Notes (Signed)
Subjective:    Patient ID: Kerri Harris, female    DOB: [birth year], 25 y.o.   MRN: [id number]  Chief Complaint  Patient presents with  . CPE    not fasting    HPI:  Kerri Harris is a 25 y.o. female who presents today for an annual wellness visit.   1) Health Maintenance -   Diet - Averages about 3 meals per day; Using lactaid milk; No caffeine  Exercise - Walking with her walker  2) Preventative Exams / Immunizations:  Dental -- Due for exam  Vision -- Due for exam   Health Maintenance  Topic Date Due  . HIV Screening  05/09/2005  . TETANUS/TDAP  05/09/2009  . PAP SMEAR  05/10/2011  . INFLUENZA VACCINE  07/04/2016    Immunization History  Administered Date(s) Administered  . Tdap 07/20/2016    No Known Allergies   Outpatient Medications Prior to Visit  Medication Sig Dispense Refill  . Fluticasone Furoate-Vilanterol (BREO ELLIPTA) 100-25 MCG/INH AEPB Inhale 1 puff into the lungs daily as needed. 28 each 0  . HYDROcodone-homatropine (HYCODAN) 5-1.5 MG/5ML syrup Take 5 mLs by mouth every 8 (eight) hours as needed for cough. 120 mL 0  . levofloxacin (LEVAQUIN) 500 MG tablet Take 1 tablet (500 mg total) by mouth daily. 7 tablet 0   No facility-administered medications prior to visit.      Past Medical History:  Diagnosis Date  . Cerebral palsy (HCC)   . Seizures (HCC)    Childhood seizures - not since age 34     Past Surgical History:  Procedure Laterality Date  . feet surgery Bilateral   . HIP SURGERY Bilateral   . KNEE SURGERY Bilateral      Family History  Problem Relation Age of Onset  . Arthritis Mother   . Hypertension Maternal Grandmother   . Diabetes Maternal Grandmother      Social History   Social History  . Marital status: Single    Spouse name: N/A  . Number of children: 0  . Years of education: 12   Occupational History  . Not on file.   Social History Main Topics  . Smoking status: Never Smoker  . Smokeless  tobacco: Never Used  . Alcohol use No  . Drug use: No  . Sexual activity: Not on file   Other Topics Concern  . Not on file   Social History Narrative   Fun: Play and exercise   Denies religious beliefs effecting health care.      Review of Systems  Constitutional: Denies fever, chills, fatigue, or significant weight gain/loss. HENT: Head: Denies headache or neck pain Ears: Denies changes in hearing, ringing in ears, earache, drainage Nose: Denies discharge, stuffiness, itching, nosebleed, sinus pain Throat: Denies sore throat, hoarseness, dry mouth, sores, thrush Eyes: Denies loss/changes in vision, pain, redness, blurry/double vision, flashing lights Cardiovascular: Denies chest pain/discomfort, tightness, palpitations, shortness of breath with activity, difficulty lying down, swelling, sudden awakening with shortness of breath Respiratory: Denies shortness of breath, cough, sputum production, wheezing Gastrointestinal: Denies dysphasia, heartburn, change in appetite, nausea, change in bowel habits, rectal bleeding, constipation, diarrhea, yellow skin or eyes Genitourinary: Denies frequency, urgency, burning/pain, blood in urine, incontinence, change in urinary strength. Musculoskeletal: Denies muscle/joint pain, stiffness, back pain, redness or swelling of joints, trauma Skin: Denies rashes, lumps, itching, dryness, color changes, or hair/nail changes Neurological: Denies dizziness, fainting, seizures, weakness, numbness, tingling, tremor Psychiatric - Denies nervousness, stress, depression or memory loss Endocrine:  Denies heat or cold intolerance, sweating, frequent urination, excessive thirst, changes in appetite Hematologic: Denies ease of bruising or bleeding     Objective:     BP 128/82 (BP Location: Left Arm, Patient Position: Sitting, Cuff Size: Normal)   Pulse 98   Temp 98.6 F (37 C) (Oral)   Resp 16   Ht 4\' 11"  (1.499 m)   Wt 107 lb (48.5 kg)   SpO2 97%   BMI  21.61 kg/m  Nursing note and vital signs reviewed.  Physical Exam  Constitutional: She is oriented to person, place, and time. She appears well-developed and well-nourished.  HENT:  Head: Normocephalic.  Right Ear: Hearing, tympanic membrane, external ear and ear canal normal.  Left Ear: Hearing, tympanic membrane, external ear and ear canal normal.  Nose: Nose normal.  Mouth/Throat: Uvula is midline, oropharynx is clear and moist and mucous membranes are normal.  Eyes: Conjunctivae and EOM are normal. Pupils are equal, round, and reactive to light.  Neck: Neck supple. No JVD present. No tracheal deviation present. No thyromegaly present.  Cardiovascular: Normal rate, regular rhythm, normal heart sounds and intact distal pulses.   Pulmonary/Chest: Effort normal and breath sounds normal.  Abdominal: Soft. Bowel sounds are normal. She exhibits no distension and no mass. There is no tenderness. There is no rebound and no guarding.  Musculoskeletal: Normal range of motion. She exhibits no edema or tenderness.  Lymphadenopathy:    She has no cervical adenopathy.  Neurological: She is alert and oriented to person, place, and time. She has normal reflexes. No cranial nerve deficit. She exhibits normal muscle tone. Coordination normal.  Skin: Skin is warm and dry.  Psychiatric: She has a normal mood and affect. Her behavior is normal. Judgment and thought content normal.       Assessment & Plan:   Problem List Items Addressed This Visit      Nervous and Auditory   Cerebral palsy (HCC)     Other   Routine general medical examination at a health care facility - Primary    1) Anticipatory Guidance: Discussed importance of wearing a seatbelt while driving and not texting while driving; changing batteries in smoke detector at least once annually; wearing suntan lotion when outside; eating a balanced and moderate diet; getting physical activity at least 30 minutes per day.  2) Immunizations /  Screenings / Labs:  Tetanus updated today. All other immunizations are up-to-date per recommendations. Due for a dental and vision screen encouraged to be completed independently. Due for Pap smear which mother has refused at this time she is mentally handicapped. All other screenings are up-to-date per recommendations. Obtain CBC, CMET, and lipid profile.  Overall well exam with minimal risk factors for cardiovascular disease noted. Still does require assistance while at home secondary to her cerebral palsy. Continues to have limited physical mobility of her legs and hands. Continue healthy lifestyle behaviors and choices. Follow up prevention exam in 1 year and follow up office visit pending blood work as needed.       Relevant Orders   CBC (Completed)   Comprehensive metabolic panel (Completed)   Lipid panel (Completed)    Other Visit Diagnoses    Need for diphtheria-tetanus-pertussis (Tdap) vaccine, adult/adolescent       Relevant Orders   Tdap vaccine greater than or equal to 7yo IM (Completed)       I have discontinued Ms. Ruddy's levofloxacin, fluticasone furoate-vilanterol, and HYDROcodone-homatropine.   Follow-up: Return if symptoms worsen or fail  to improve.   Mauricio Po, FNP

## 2016-07-20 NOTE — Assessment & Plan Note (Addendum)
1) Anticipatory Guidance: Discussed importance of wearing a seatbelt while driving and not texting while driving; changing batteries in smoke detector at least once annually; wearing suntan lotion when outside; eating a balanced and moderate diet; getting physical activity at least 30 minutes per day.  2) Immunizations / Screenings / Labs:  Tetanus updated today. All other immunizations are up-to-date per recommendations. Due for a dental and vision screen encouraged to be completed independently. Due for Pap smear which mother has refused at this time she is mentally handicapped. All other screenings are up-to-date per recommendations. Obtain CBC, CMET, and lipid profile.  Overall well exam with minimal risk factors for cardiovascular disease noted. Still does require assistance while at home secondary to her cerebral palsy. Continues to have limited physical mobility of her legs and hands. Continue healthy lifestyle behaviors and choices. Follow up prevention exam in 1 year and follow up office visit pending blood work as needed.

## 2016-07-20 NOTE — Patient Instructions (Addendum)
Thank you for choosing [facility].  Summary/Instructions:  Please stop by the lab on the lower level of the building for your blood work. Your results will be released to [place] (or called to you) after review, usually within 72 hours after test completion. If any changes need to be made, you will be notified at that same time.  1. The lab is open from 7:30am to 5:30 pm Monday-Friday  2. No appointment is necessary  3. Fasting (if needed) is 6-8 hours after food and drink; black  coffee and water are okay   If your symptoms worsen or fail to improve, please contact our office for further instruction, or in case of emergency go directly to the emergency room at the closest medical facility.    Health Maintenance, Female Adopting a healthy lifestyle and getting preventive care can go a long way to promote health and wellness. Talk with your health care provider about what schedule of regular examinations is right for you. This is a good chance for you to check in with your provider about disease prevention and staying healthy. In between checkups, there are plenty of things you can do on your own. Experts have done a lot of research about which lifestyle changes and preventive measures are most likely to keep you healthy. Ask your health care provider for more information. WEIGHT AND DIET  Eat a healthy diet  Be sure to include plenty of vegetables, fruits, low-fat dairy products, and lean protein.  Do not eat a lot of foods high in solid fats, added sugars, or salt.  Get regular exercise. This is one of the most important things you can do for your health.  Most adults should exercise for at least 150 minutes each week. The exercise should increase your heart rate and make you sweat (moderate-intensity exercise).  Most adults should also do strengthening exercises at least twice a week. This is in addition to the moderate-intensity exercise.  Maintain a healthy weight  Body mass  index (BMI) is a measurement that can be used to identify possible weight problems. It estimates body fat based on height and weight. Your health care provider can help determine your BMI and help you achieve or maintain a healthy weight.  For females 61 years of age and older:   A BMI below 18.5 is considered underweight.  A BMI of 18.5 to 24.9 is normal.  A BMI of 25 to 29.9 is considered overweight.  A BMI of 30 and above is considered obese.  Watch levels of cholesterol and blood lipids  You should start having your blood tested for lipids and cholesterol at 26 years of age, then have this test every 5 years.  You may need to have your cholesterol levels checked more often if:  Your lipid or cholesterol levels are high.  You are older than 26 years of age.  You are at high risk for heart disease.  CANCER SCREENING   Lung Cancer  Lung cancer screening is recommended for adults 74-36 years old who are at high risk for lung cancer because of a history of smoking.  A yearly low-dose CT scan of the lungs is recommended for people who:  Currently smoke.  Have quit within the past 15 years.  Have at least a 30-pack-year history of smoking. A pack year is smoking an average of one pack of cigarettes a day for 1 year.  Yearly screening should continue until it has been 15 years since you quit.  Yearly screening should stop if you develop a health problem that would prevent you from having lung cancer treatment.  Breast Cancer  Practice breast self-awareness. This means understanding how your breasts normally appear and feel.  It also means doing regular breast self-exams. Let your health care provider know about any changes, no matter how small.  If you are in your 20s or 30s, you should have a clinical breast exam (CBE) by a health care provider every 1-3 years as part of a regular health exam.  If you are 40 or older, have a CBE every year. Also consider having a  breast X-ray (mammogram) every year.  If you have a family history of breast cancer, talk to your health care provider about genetic screening.  If you are at high risk for breast cancer, talk to your health care provider about having an MRI and a mammogram every year.  Breast cancer gene (BRCA) assessment is recommended for women who have family members with BRCA-related cancers. BRCA-related cancers include:  Breast.  Ovarian.  Tubal.  Peritoneal cancers.  Results of the assessment will determine the need for genetic counseling and BRCA1 and BRCA2 testing. Cervical Cancer Your health care provider may recommend that you be screened regularly for cancer of the pelvic organs (ovaries, uterus, and vagina). This screening involves a pelvic examination, including checking for microscopic changes to the surface of your cervix (Pap test). You may be encouraged to have this screening done every 3 years, beginning at age 21.  For women ages 30-65, health care providers may recommend pelvic exams and Pap testing every 3 years, or they may recommend the Pap and pelvic exam, combined with testing for human papilloma virus (HPV), every 5 years. Some types of HPV increase your risk of cervical cancer. Testing for HPV may also be done on women of any age with unclear Pap test results.  Other health care providers may not recommend any screening for nonpregnant women who are considered low risk for pelvic cancer and who do not have symptoms. Ask your health care provider if a screening pelvic exam is right for you.  If you have had past treatment for cervical cancer or a condition that could lead to cancer, you need Pap tests and screening for cancer for at least 20 years after your treatment. If Pap tests have been discontinued, your risk factors (such as having a new sexual partner) need to be reassessed to determine if screening should resume. Some women have medical problems that increase the chance of  getting cervical cancer. In these cases, your health care provider may recommend more frequent screening and Pap tests. Colorectal Cancer  This type of cancer can be detected and often prevented.  Routine colorectal cancer screening usually begins at 26 years of age and continues through 26 years of age.  Your health care provider may recommend screening at an earlier age if you have risk factors for colon cancer.  Your health care provider may also recommend using home test kits to check for hidden blood in the stool.  A small camera at the end of a tube can be used to examine your colon directly (sigmoidoscopy or colonoscopy). This is done to check for the earliest forms of colorectal cancer.  Routine screening usually begins at age 50.  Direct examination of the colon should be repeated every 5-10 years through 26 years of age. However, you may need to be screened more often if early forms of precancerous polyps or   small growths are found. Skin Cancer  Check your skin from head to toe regularly.  Tell your health care provider about any new moles or changes in moles, especially if there is a change in a mole's shape or color.  Also tell your health care provider if you have a mole that is larger than the size of a pencil eraser.  Always use sunscreen. Apply sunscreen liberally and repeatedly throughout the day.  Protect yourself by wearing long sleeves, pants, a wide-brimmed hat, and sunglasses whenever you are outside. HEART DISEASE, DIABETES, AND HIGH BLOOD PRESSURE   High blood pressure causes heart disease and increases the risk of stroke. High blood pressure is more likely to develop in:  People who have blood pressure in the high end of the normal range (130-139/85-89 mm Hg).  People who are overweight or obese.  People who are African American.  If you are 68-36 years of age, have your blood pressure checked every 3-5 years. If you are 64 years of age or older, have  your blood pressure checked every year. You should have your blood pressure measured twice--once when you are at a hospital or clinic, and once when you are not at a hospital or clinic. Record the average of the two measurements. To check your blood pressure when you are not at a hospital or clinic, you can use:  An automated blood pressure machine at a pharmacy.  A home blood pressure monitor.  If you are between 40 years and 57 years old, ask your health care provider if you should take aspirin to prevent strokes.  Have regular diabetes screenings. This involves taking a blood sample to check your fasting blood sugar level.  If you are at a normal weight and have a low risk for diabetes, have this test once every three years after 26 years of age.  If you are overweight and have a high risk for diabetes, consider being tested at a younger age or more often. PREVENTING INFECTION  Hepatitis B  If you have a higher risk for hepatitis B, you should be screened for this virus. You are considered at high risk for hepatitis B if:  You were born in a country where hepatitis B is common. Ask your health care provider which countries are considered high risk.  Your parents were born in a high-risk country, and you have not been immunized against hepatitis B (hepatitis B vaccine).  You have HIV or AIDS.  You use needles to inject street drugs.  You live with someone who has hepatitis B.  You have had sex with someone who has hepatitis B.  You get hemodialysis treatment.  You take certain medicines for conditions, including cancer, organ transplantation, and autoimmune conditions. Hepatitis C  Blood testing is recommended for:  Everyone born from [birth year] through 1966.  Anyone with known risk factors for hepatitis C. Sexually transmitted infections (STIs)  You should be screened for sexually transmitted infections (STIs) including gonorrhea and chlamydia if:  You are sexually active and  are younger than 26 years of age.  You are older than 26 years of age and your health care provider tells you that you are at risk for this type of infection.  Your sexual activity has changed since you were last screened and you are at an increased risk for chlamydia or gonorrhea. Ask your health care provider if you are at risk.  If you do not have HIV, but are at risk, it may be  recommended that you take a prescription medicine daily to prevent HIV infection. This is called pre-exposure prophylaxis (PrEP). You are considered at risk if:  You are sexually active and do not regularly use condoms or know the HIV status of your partner(s).  You take drugs by injection.  You are sexually active with a partner who has HIV. Talk with your health care provider about whether you are at high risk of being infected with HIV. If you choose to begin PrEP, you should first be tested for HIV. You should then be tested every 3 months for as long as you are taking PrEP.  PREGNANCY   If you are premenopausal and you may become pregnant, ask your health care provider about preconception counseling.  If you may become pregnant, take 400 to 800 micrograms (mcg) of folic acid every day.  If you want to prevent pregnancy, talk to your health care provider about birth control (contraception). OSTEOPOROSIS AND MENOPAUSE   Osteoporosis is a disease in which the bones lose minerals and strength with aging. This can result in serious bone fractures. Your risk for osteoporosis can be identified using a bone density scan.  If you are 65 years of age or older, or if you are at risk for osteoporosis and fractures, ask your health care provider if you should be screened.  Ask your health care provider whether you should take a calcium or vitamin D supplement to lower your risk for osteoporosis.  Menopause may have certain physical symptoms and risks.  Hormone replacement therapy may reduce some of these symptoms  and risks. Talk to your health care provider about whether hormone replacement therapy is right for you.  HOME CARE INSTRUCTIONS   Schedule regular health, dental, and eye exams.  Stay current with your immunizations.   Do not use any tobacco products including cigarettes, chewing tobacco, or electronic cigarettes.  If you are pregnant, do not drink alcohol.  If you are breastfeeding, limit how much and how often you drink alcohol.  Limit alcohol intake to no more than 1 drink per day for nonpregnant women. One drink equals 12 ounces of beer, 5 ounces of wine, or 1 ounces of hard liquor.  Do not use street drugs.  Do not share needles.  Ask your health care provider for help if you need support or information about quitting drugs.  Tell your health care provider if you often feel depressed.  Tell your health care provider if you have ever been abused or do not feel safe at home.   This information is not intended to replace advice given to you by your health care provider. Make sure you discuss any questions you have with your health care provider.   Document Released: 06/05/2011 Document Revised: 12/11/2014 Document Reviewed: 10/22/2013 Elsevier Interactive Patient Education 2016 [employer]vier Inc.  

## 2016-07-21 ENCOUNTER — Telehealth: Payer: Self-pay

## 2016-07-21 NOTE — Telephone Encounter (Signed)
Faxed pts [employer] forms to the number listed on the form. All forms have been sent out from office visit to the correct destination. Called to let pts mother know and that I made copies for her to come and pick up.

## 2016-11-30 ENCOUNTER — Ambulatory Visit (INDEPENDENT_AMBULATORY_CARE_PROVIDER_SITE_OTHER): Payer: BC Managed Care – PPO | Admitting: Nurse Practitioner

## 2016-11-30 ENCOUNTER — Encounter: Payer: Self-pay | Admitting: Nurse Practitioner

## 2016-11-30 ENCOUNTER — Ambulatory Visit (INDEPENDENT_AMBULATORY_CARE_PROVIDER_SITE_OTHER)
Admission: RE | Admit: 2016-11-30 | Discharge: 2016-11-30 | Disposition: A | Discharge status: Home / Self Care | Payer: BC Managed Care – PPO | Source: Ambulatory Visit | Attending: Nurse Practitioner | Admitting: Nurse Practitioner

## 2016-11-30 VITALS — BP 120/72 | HR 108 | Temp 99.0°F | Wt 102.0 lb

## 2016-11-30 DIAGNOSIS — M25531 Pain in right wrist: Secondary | ICD-10-CM | POA: Diagnosis not present

## 2016-11-30 DIAGNOSIS — M25431 Effusion, right wrist: Secondary | ICD-10-CM

## 2016-11-30 DIAGNOSIS — M8700 Idiopathic aseptic necrosis of unspecified bone: Secondary | ICD-10-CM

## 2016-11-30 DIAGNOSIS — Z23 Encounter for immunization: Secondary | ICD-10-CM | POA: Diagnosis not present

## 2016-11-30 NOTE — Progress Notes (Signed)
   Subjective:  Patient ID: Kerri Harris, female    DOB: 03/22/1991  Age: 25 y.o. MRN: [id number]  CC: Wrist Pain (right writst pain,swelling. ice compress. flu shot?)   Wrist Pain   The pain is present in the right wrist. This is a new problem. The current episode started in the past 7 days. There has been a history of trauma. The problem occurs intermittently. The problem has been gradually worsening. The quality of the pain is described as aching and sharp. Associated symptoms include joint swelling and a limited range of motion. Pertinent negatives include no fever, inability to bear weight, itching, joint locking, numbness, stiffness or tingling. The symptoms are aggravated by activity. She has tried cold and rest for the symptoms. The treatment provided mild relief.    No outpatient prescriptions prior to visit.   No facility-administered medications prior to visit.     ROS See HPI  Objective:  BP 120/72   Pulse (!) 108   Temp 99 F (37.2 C)   Wt 102 lb (46.3 kg)   LMP 11/08/2016   SpO2 98%   BMI 20.60 kg/m   BP Readings from Last 3 Encounters:  11/30/16 120/72  07/20/16 128/82  07/08/15 120/80    Wt Readings from Last 3 Encounters:  11/30/16 102 lb (46.3 kg)  07/20/16 107 lb (48.5 kg)  07/08/15 97 lb (44 kg)    Physical Exam  Constitutional: She is oriented to person, place, and time. No distress.  Musculoskeletal: She exhibits edema and tenderness.       Right wrist: She exhibits tenderness, bony tenderness and swelling. She exhibits normal range of motion, no effusion, no crepitus and no laceration.       Right hand: Normal.       Hands: Neurological: She is alert and oriented to person, place, and time.  Vitals reviewed.   Lab Results  Component Value Date   WBC 5.3 07/20/2016   HGB 13.0 07/20/2016   HCT 38.5 07/20/2016   PLT 295.0 07/20/2016   GLUCOSE 94 07/20/2016   CHOL 133 07/20/2016   TRIG 94.0 07/20/2016   HDL 47.20 07/20/2016   LDLCALC 67  07/20/2016   ALT 12 07/20/2016   AST 16 07/20/2016   NA 135 07/20/2016   K 3.6 07/20/2016   CL 103 07/20/2016   CREATININE 0.58 07/20/2016   BUN 11 07/20/2016   CO2 22 07/20/2016   TSH 1.69 06/11/2015    Patient was never admitted.  Assessment & Plan:   Kerri Harris was seen today for wrist pain.  Diagnoses and all orders for this visit:  Pain and swelling of wrist, right -     DG Wrist Complete Right; Future -     MR WRIST RIGHT WO CONTRAST; Future  Need for prophylactic vaccination and inoculation against influenza -     Flu Vaccine QUAD 36+ mos IM  Avascular necrosis (HCC) -     MR WRIST RIGHT WO CONTRAST; Future   Kerri Harris does not currently have medications on file.  No orders of the defined types were placed in this encounter.   Follow-up: No Follow-up on file.  Alysia [place] Briseidy Spark, NP

## 2016-11-30 NOTE — Progress Notes (Signed)
Pre visit review using our clinic review tool, if applicable. No additional management support is needed unless otherwise documented below in the visit note. 

## 2016-12-01 NOTE — Patient Instructions (Signed)
wear wrist splint during day and of off at night. Pending wrist MRI. Consider referral to ortho.

## 2016-12-11 ENCOUNTER — Ambulatory Visit
Admission: RE | Admit: 2016-12-11 | Discharge: 2016-12-11 | Disposition: A | Discharge status: Home / Self Care | Payer: BC Managed Care – PPO | Source: Ambulatory Visit | Attending: Nurse Practitioner | Admitting: Nurse Practitioner

## 2016-12-11 ENCOUNTER — Other Ambulatory Visit: Payer: Self-pay | Admitting: Nurse Practitioner

## 2016-12-11 DIAGNOSIS — M25531 Pain in right wrist: Principal | ICD-10-CM

## 2016-12-11 DIAGNOSIS — M25431 Effusion, right wrist: Secondary | ICD-10-CM

## 2016-12-11 DIAGNOSIS — M8700 Idiopathic aseptic necrosis of unspecified bone: Secondary | ICD-10-CM

## 2017-03-05 ENCOUNTER — Telehealth: Payer: Self-pay | Admitting: Family

## 2017-03-05 DIAGNOSIS — Z742 Need for assistance at home and no other household member able to render care: Secondary | ICD-10-CM

## 2017-03-05 DIAGNOSIS — G809 Cerebral palsy, unspecified: Secondary | ICD-10-CM

## 2017-03-05 NOTE — Telephone Encounter (Signed)
Pt mother called wanted wanting referral for a new wheelchair.

## 2017-03-06 NOTE — Telephone Encounter (Signed)
Orders for wheelchair have been placed.

## 2017-06-12 NOTE — Telephone Encounter (Signed)
Will you please write a written rx for wheelchair so I can fax to [facility]. Thank you

## 2017-06-13 NOTE — Telephone Encounter (Signed)
Rx has been faxed to [facility].

## 2017-06-13 NOTE — Telephone Encounter (Signed)
Please confirm the type of wheelchair that she is needing to order appropriately.

## 2017-06-13 NOTE — Telephone Encounter (Signed)
She needs a manual wheelchair.

## 2017-06-13 NOTE — Telephone Encounter (Signed)
Prescription written

## 2017-06-14 ENCOUNTER — Telehealth: Payer: Self-pay | Admitting: Family

## 2017-06-14 NOTE — Telephone Encounter (Signed)
Mother needs letters stating her diagnosis or medical or mental needs that she may have   Mother is trying to get her in a day program  [facility] Attn: Irving Burtonmily in the IDD dept  Fax number [phone]   She needs this fas as soon has possible

## 2017-06-15 NOTE — Telephone Encounter (Signed)
Note needs basic diagnosis. She needs help with ADL's. She is able to feed herself. She is ambulatory with a walker. Ok for note to be done on Monday.

## 2017-06-17 ENCOUNTER — Encounter: Payer: Self-pay | Admitting: Family

## 2017-06-17 NOTE — Telephone Encounter (Signed)
Letter written and printed.

## 2017-06-20 ENCOUNTER — Encounter: Payer: Self-pay | Admitting: Family

## 2017-06-20 NOTE — Telephone Encounter (Signed)
Mom stated that the letter was good but also needs to state that she is mentally disabled as well.

## 2017-06-20 NOTE — Telephone Encounter (Signed)
Mother is aware letter is ready for pick up at front desk.

## 2017-06-20 NOTE — Telephone Encounter (Signed)
New letter written.

## 2017-07-23 ENCOUNTER — Encounter: Payer: Medicaid Other | Admitting: Family

## 2017-08-07 ENCOUNTER — Encounter: Payer: Self-pay | Admitting: Family

## 2017-08-07 ENCOUNTER — Ambulatory Visit (INDEPENDENT_AMBULATORY_CARE_PROVIDER_SITE_OTHER): Payer: Medicaid Other | Admitting: Family

## 2017-08-07 ENCOUNTER — Other Ambulatory Visit (INDEPENDENT_AMBULATORY_CARE_PROVIDER_SITE_OTHER): Payer: Medicaid Other

## 2017-08-07 ENCOUNTER — Telehealth: Payer: Self-pay | Admitting: Family

## 2017-08-07 VITALS — BP 126/64 | HR 100 | Temp 98.8°F | Resp 16 | Ht 59.0 in | Wt 101.0 lb

## 2017-08-07 DIAGNOSIS — Z01419 Encounter for gynecological examination (general) (routine) without abnormal findings: Secondary | ICD-10-CM | POA: Diagnosis not present

## 2017-08-07 DIAGNOSIS — Z0000 Encounter for general adult medical examination without abnormal findings: Secondary | ICD-10-CM | POA: Diagnosis not present

## 2017-08-07 DIAGNOSIS — Z23 Encounter for immunization: Secondary | ICD-10-CM

## 2017-08-07 DIAGNOSIS — G809 Cerebral palsy, unspecified: Secondary | ICD-10-CM

## 2017-08-07 DIAGNOSIS — Z0001 Encounter for general adult medical examination with abnormal findings: Secondary | ICD-10-CM

## 2017-08-07 LAB — CBC
HCT: 39.6 % (ref 36.0–46.0)
HEMOGLOBIN: 12.9 g/dL (ref 12.0–15.0)
MCHC: 32.5 g/dL (ref 30.0–36.0)
MCV: 86.3 fl (ref 78.0–100.0)
PLATELETS: 312 10*3/uL (ref 150.0–400.0)
RBC: 4.6 Mil/uL (ref 3.87–5.11)
RDW: 12.9 % (ref 11.5–15.5)
WBC: 6.5 10*3/uL (ref 4.0–10.5)

## 2017-08-07 LAB — LIPID PANEL
CHOL/HDL RATIO: 3
Cholesterol: 139 mg/dL (ref 0–200)
HDL: 49 mg/dL (ref >39.00)
LDL Cholesterol: 61 mg/dL (ref 0–99)
NONHDL: 90.49
TRIGLYCERIDES: 148 mg/dL (ref 0.0–149.0)
VLDL: 29.6 mg/dL (ref 0.0–40.0)

## 2017-08-07 LAB — COMPREHENSIVE METABOLIC PANEL
ALK PHOS: 49 U/L (ref 39–117)
ALT: 12 U/L (ref 0–35)
AST: 14 U/L (ref 0–37)
Albumin: 4.6 g/dL (ref 3.5–5.2)
BUN: 12 mg/dL (ref 6–23)
CALCIUM: 9.6 mg/dL (ref 8.4–10.5)
CO2: 22 mEq/L (ref 19–32)
CREATININE: 0.58 mg/dL (ref 0.40–1.20)
Chloride: 105 mEq/L (ref 96–112)
GFR: 132.3 mL/min (ref >60.00)
GLUCOSE: 117 mg/dL — AB (ref 70–99)
Potassium: 3.5 mEq/L (ref 3.5–5.1)
Sodium: 138 mEq/L (ref 135–145)
TOTAL PROTEIN: 7.3 g/dL (ref 6.0–8.3)
Total Bilirubin: 0.9 mg/dL (ref 0.2–1.2)

## 2017-08-07 MED ORDER — BACLOFEN 10 MG PO TABS: 5.0000 mg | ORAL_TABLET | Freq: Three times a day (TID) | ORAL | 0 refills | Status: DC | PRN

## 2017-08-07 NOTE — Patient Instructions (Addendum)
Thank you for choosing [facility].  SUMMARY AND INSTRUCTIONS:  Please start the baclofen for the spasticity.  Continue with physical activity.  Overall doing great!  They will call with your referral to GYN  Medication:  Your prescription(s) have been submitted to your pharmacy or been printed and provided for you. Please take as directed and contact our office if you believe you are having problem(s) with the medication(s) or have any questions.  Labs:  Please stop by the lab on the lower level of the building for your blood work. Your results will be released to [facility] (or called to you) after review, usually within 72 hours after test completion. If any changes need to be made, you will be notified at that same time.  1.) The lab is open from 7:30am to 5:30 pm Monday-Friday 2.) No appointment is necessary 3.) Fasting (if needed) is 6-8 hours after food and drink; black coffee and water are okay   Follow up:  If your symptoms worsen or fail to improve, please contact our office for further instruction, or in case of emergency go directly to the emergency room at the closest medical facility.    Health Maintenance, Female Adopting a healthy lifestyle and getting preventive care can go a long way to promote health and wellness. Talk with your health care provider about what schedule of regular examinations is right for you. This is a good chance for you to check in with your provider about disease prevention and staying healthy. In between checkups, there are plenty of things you can do on your own. Experts have done a lot of research about which lifestyle changes and preventive measures are most likely to keep you healthy. Ask your health care provider for more information. Weight and diet Eat a healthy diet  Be sure to include plenty of vegetables, fruits, low-fat dairy products, and lean protein.  Do not eat a lot of foods high in solid fats, added sugars, or  salt.  Get regular exercise. This is one of the most important things you can do for your health. ? Most adults should exercise for at least 150 minutes each week. The exercise should increase your heart rate and make you sweat (moderate-intensity exercise). ? Most adults should also do strengthening exercises at least twice a week. This is in addition to the moderate-intensity exercise.  Maintain a healthy weight  Body mass index (BMI) is a measurement that can be used to identify possible weight problems. It estimates body fat based on height and weight. Your health care provider can help determine your BMI and help you achieve or maintain a healthy weight.  For females 67 years of age and older: ? A BMI below 18.5 is considered underweight. ? A BMI of 18.5 to 24.9 is normal. ? A BMI of 25 to 29.9 is considered overweight. ? A BMI of 30 and above is considered obese.  Watch levels of cholesterol and blood lipids  You should start having your blood tested for lipids and cholesterol at 27 years of age, then have this test every 5 years.  You may need to have your cholesterol levels checked more often if: ? Your lipid or cholesterol levels are high. ? You are older than 27 years of age. ? You are at high risk for heart disease.  Cancer screening Lung Cancer  Lung cancer screening is recommended for adults 68-79 years old who are at high risk for lung cancer because of a history of  smoking.  A yearly low-dose CT scan of the lungs is recommended for people who: ? Currently smoke. ? Have quit within the past 15 years. ? Have at least a 30-pack-year history of smoking. A pack year is smoking an average of one pack of cigarettes a day for 1 year.  Yearly screening should continue until it has been 15 years since you quit.  Yearly screening should stop if you develop a health problem that would prevent you from having lung cancer treatment.  Breast Cancer  Practice breast  self-awareness. This means understanding how your breasts normally appear and feel.  It also means doing regular breast self-exams. Let your health care provider know about any changes, no matter how small.  If you are in your 20s or 30s, you should have a clinical breast exam (CBE) by a health care provider every 1-3 years as part of a regular health exam.  If you are 90 or older, have a CBE every year. Also consider having a breast X-ray (mammogram) every year.  If you have a family history of breast cancer, talk to your health care provider about genetic screening.  If you are at high risk for breast cancer, talk to your health care provider about having an MRI and a mammogram every year.  Breast cancer gene (BRCA) assessment is recommended for women who have family members with BRCA-related cancers. BRCA-related cancers include: ? Breast. ? Ovarian. ? Tubal. ? Peritoneal cancers.  Results of the assessment will determine the need for genetic counseling and BRCA1 and BRCA2 testing.  Cervical Cancer Your health care provider may recommend that you be screened regularly for cancer of the pelvic organs (ovaries, uterus, and vagina). This screening involves a pelvic examination, including checking for microscopic changes to the surface of your cervix (Pap test). You may be encouraged to have this screening done every 3 years, beginning at age 50.  For women ages 87-65, health care providers may recommend pelvic exams and Pap testing every 3 years, or they may recommend the Pap and pelvic exam, combined with testing for human papilloma virus (HPV), every 5 years. Some types of HPV increase your risk of cervical cancer. Testing for HPV may also be done on women of any age with unclear Pap test results.  Other health care providers may not recommend any screening for nonpregnant women who are considered low risk for pelvic cancer and who do not have symptoms. Ask your health care provider if a  screening pelvic exam is right for you.  If you have had past treatment for cervical cancer or a condition that could lead to cancer, you need Pap tests and screening for cancer for at least 20 years after your treatment. If Pap tests have been discontinued, your risk factors (such as having a new sexual partner) need to be reassessed to determine if screening should resume. Some women have medical problems that increase the chance of getting cervical cancer. In these cases, your health care provider may recommend more frequent screening and Pap tests.  Colorectal Cancer  This type of cancer can be detected and often prevented.  Routine colorectal cancer screening usually begins at 27 years of age and continues through 27 years of age.  Your health care provider may recommend screening at an earlier age if you have risk factors for colon cancer.  Your health care provider may also recommend using home test kits to check for hidden blood in the stool.  A small camera at  the end of a tube can be used to examine your colon directly (sigmoidoscopy or colonoscopy). This is done to check for the earliest forms of colorectal cancer.  Routine screening usually begins at age 64.  Direct examination of the colon should be repeated every 5-10 years through 27 years of age. However, you may need to be screened more often if early forms of precancerous polyps or small growths are found.  Skin Cancer  Check your skin from head to toe regularly.  Tell your health care provider about any new moles or changes in moles, especially if there is a change in a mole's shape or color.  Also tell your health care provider if you have a mole that is larger than the size of a pencil eraser.  Always use sunscreen. Apply sunscreen liberally and repeatedly throughout the day.  Protect yourself by wearing long sleeves, pants, a wide-brimmed hat, and sunglasses whenever you are outside.  Heart disease, diabetes, and  high blood pressure  High blood pressure causes heart disease and increases the risk of stroke. High blood pressure is more likely to develop in: ? People who have blood pressure in the high end of the normal range (130-139/85-89 mm Hg). ? People who are overweight or obese. ? People who are African American.  If you are 78-85 years of age, have your blood pressure checked every 3-5 years. If you are 21 years of age or older, have your blood pressure checked every year. You should have your blood pressure measured twice-once when you are at a hospital or clinic, and once when you are not at a hospital or clinic. Record the average of the two measurements. To check your blood pressure when you are not at a hospital or clinic, you can use: ? An automated blood pressure machine at a pharmacy. ? A home blood pressure monitor.  If you are between 40 years and 70 years old, ask your health care provider if you should take aspirin to prevent strokes.  Have regular diabetes screenings. This involves taking a blood sample to check your fasting blood sugar level. ? If you are at a normal weight and have a low risk for diabetes, have this test once every three years after 27 years of age. ? If you are overweight and have a high risk for diabetes, consider being tested at a younger age or more often. Preventing infection Hepatitis B  If you have a higher risk for hepatitis B, you should be screened for this virus. You are considered at high risk for hepatitis B if: ? You were born in a country where hepatitis B is common. Ask your health care provider which countries are considered high risk. ? Your parents were born in a high-risk country, and you have not been immunized against hepatitis B (hepatitis B vaccine). ? You have HIV or AIDS. ? You use needles to inject street drugs. ? You live with someone who has hepatitis B. ? You have had sex with someone who has hepatitis B. ? You get hemodialysis  treatment. ? You take certain medicines for conditions, including cancer, organ transplantation, and autoimmune conditions.  Hepatitis C  Blood testing is recommended for: ? Everyone born from [birth year] through 1966. ? Anyone with known risk factors for hepatitis C.  Sexually transmitted infections (STIs)  You should be screened for sexually transmitted infections (STIs) including gonorrhea and chlamydia if: ? You are sexually active and are younger than 27 years of age. ?  You are older than 27 years of age and your health care provider tells you that you are at risk for this type of infection. ? Your sexual activity has changed since you were last screened and you are at an increased risk for chlamydia or gonorrhea. Ask your health care provider if you are at risk.  If you do not have HIV, but are at risk, it may be recommended that you take a prescription medicine daily to prevent HIV infection. This is called pre-exposure prophylaxis (PrEP). You are considered at risk if: ? You are sexually active and do not regularly use condoms or know the HIV status of your partner(s). ? You take drugs by injection. ? You are sexually active with a partner who has HIV.  Talk with your health care provider about whether you are at high risk of being infected with HIV. If you choose to begin PrEP, you should first be tested for HIV. You should then be tested every 3 months for as long as you are taking PrEP. Pregnancy  If you are premenopausal and you may become pregnant, ask your health care provider about preconception counseling.  If you may become pregnant, take 400 to 800 micrograms (mcg) of folic acid every day.  If you want to prevent pregnancy, talk to your health care provider about birth control (contraception). Osteoporosis and menopause  Osteoporosis is a disease in which the bones lose minerals and strength with aging. This can result in serious bone fractures. Your risk for osteoporosis  can be identified using a bone density scan.  If you are 56 years of age or older, or if you are at risk for osteoporosis and fractures, ask your health care provider if you should be screened.  Ask your health care provider whether you should take a calcium or vitamin D supplement to lower your risk for osteoporosis.  Menopause may have certain physical symptoms and risks.  Hormone replacement therapy may reduce some of these symptoms and risks. Talk to your health care provider about whether hormone replacement therapy is right for you. Follow these instructions at home:  Schedule regular health, dental, and eye exams.  Stay current with your immunizations.  Do not use any tobacco products including cigarettes, chewing tobacco, or electronic cigarettes.  If you are pregnant, do not drink alcohol.  If you are breastfeeding, limit how much and how often you drink alcohol.  Limit alcohol intake to no more than 1 drink per day for nonpregnant women. One drink equals 12 ounces of beer, 5 ounces of wine, or 1 ounces of hard liquor.  Do not use street drugs.  Do not share needles.  Ask your health care provider for help if you need support or information about quitting drugs.  Tell your health care provider if you often feel depressed.  Tell your health care provider if you have ever been abused or do not feel safe at home. This information is not intended to replace advice given to you by your health care provider. Make sure you discuss any questions you have with your health care provider. Document Released: 06/05/2011 Document Revised: 04/27/2016 Document Reviewed: 08/24/2015 Elsevier Interactive Patient Education  [employer].

## 2017-08-07 NOTE — Progress Notes (Signed)
Subjective:    Patient ID: Kerri Harris, female    DOB: 11/29/1991, 26 y.o.   MRN: [id number]  Chief Complaint  Patient presents with  . CPE    not fasting    HPI:  Kerri Harris is a 26 y.o. female who presents today for an annual wellness visit.   1) Health Maintenance -   Diet - Averages about 3 meals per day with snacks consisting of a regular diet; No caffeine intake.  Exercise - Walking around and pushing her chair.    2) Preventative Exams / Immunizations:  Dental -- Up to date  Vision -- Due for exam   Health Maintenance  Topic Date Due  . HIV Screening  05/09/2005  . PAP SMEAR  05/10/2011  . INFLUENZA VACCINE  07/04/2017  . TETANUS/TDAP  07/20/2026    Immunization History  Administered Date(s) Administered  . Influenza,inj,Quad PF,6+ Mos 11/30/2016, 08/07/2017  . Tdap 07/20/2016     No Known Allergies   No outpatient prescriptions prior to visit.   No facility-administered medications prior to visit.      Past Medical History:  Diagnosis Date  . Cerebral palsy (HCC)   . Seizures (HCC)    Childhood seizures - not since age 89     Past Surgical History:  Procedure Laterality Date  . feet surgery Bilateral   . HIP SURGERY Bilateral   . KNEE SURGERY Bilateral      Family History  Problem Relation Age of Onset  . Arthritis Mother   . Hypertension Maternal Grandmother   . Diabetes Maternal Grandmother      Social History   Social History  . Marital status: Single    Spouse name: N/A  . Number of children: 0  . Years of education: 12   Occupational History  . Not on file.   Social History Main Topics  . Smoking status: Never Smoker  . Smokeless tobacco: Never Used  . Alcohol use No  . Drug use: No  . Sexual activity: Not on file   Other Topics Concern  . Not on file   Social History Narrative   Fun: Play and exercise, read   Denies religious beliefs effecting health care.       Review of Systems    Constitutional: Denies fever, chills, fatigue, or significant weight gain/loss. HENT: Head: Denies headache or neck pain Ears: Denies changes in hearing, ringing in ears, earache, drainage Nose: Denies discharge, stuffiness, itching, nosebleed, sinus pain Throat: Denies sore throat, hoarseness, dry mouth, sores, thrush Eyes: Denies loss/changes in vision, pain, redness, blurry/double vision, flashing lights Cardiovascular: Denies chest pain/discomfort, tightness, palpitations, shortness of breath with activity, difficulty lying down, swelling, sudden awakening with shortness of breath Respiratory: Denies shortness of breath, cough, sputum production, wheezing Gastrointestinal: Denies dysphasia, heartburn, change in appetite, nausea, change in bowel habits, rectal bleeding, constipation, diarrhea, yellow skin or eyes Genitourinary: Denies frequency, urgency, burning/pain, blood in urine, incontinence, change in urinary strength. Musculoskeletal: Denies muscle/joint pain, stiffness, back pain, redness or swelling of joints, trauma Skin: Denies rashes, lumps, itching, dryness, color changes, or hair/nail changes Neurological: Denies dizziness, fainting, seizures, weakness, numbness, tingling, tremor Psychiatric - Denies nervousness, stress, depression or memory loss Endocrine: Denies heat or cold intolerance, sweating, frequent urination, excessive thirst, changes in appetite Hematologic: Denies ease of bruising or bleeding     Objective:     BP 126/64 (BP Location: Left Arm, Patient Position: Sitting, Cuff Size: Normal)   Pulse 100  Temp 98.8 F (37.1 C) (Oral)   Resp 16   Ht 4\' 11"  (1.499 m)   Wt 101 lb (45.8 kg)   SpO2 97%   BMI 20.40 kg/m  Nursing note and vital signs reviewed.  Physical Exam  Constitutional: She is oriented to person, place, and time. She appears well-developed and well-nourished.  HENT:  Head: Normocephalic.  Right Ear: Hearing, tympanic membrane, external  ear and ear canal normal.  Left Ear: Hearing, tympanic membrane, external ear and ear canal normal.  Nose: Nose normal.  Mouth/Throat: Uvula is midline, oropharynx is clear and moist and mucous membranes are normal.  Eyes: Pupils are equal, round, and reactive to light. Conjunctivae and EOM are normal.  Neck: Neck supple. No JVD present. No tracheal deviation present. No thyromegaly present.  Cardiovascular: Normal rate, regular rhythm, normal heart sounds and intact distal pulses.   Pulmonary/Chest: Effort normal and breath sounds normal.  Abdominal: Soft. Bowel sounds are normal. She exhibits no distension and no mass. There is no tenderness. There is no rebound and no guarding.  Musculoskeletal: Normal range of motion. She exhibits no edema or tenderness.  Increased bilateral spasticity with decreased flexion of knees noted.   Lymphadenopathy:    She has no cervical adenopathy.  Neurological: She is alert and oriented to person, place, and time. She has normal reflexes. No cranial nerve deficit. She exhibits normal muscle tone. Coordination normal.  Skin: Skin is warm and dry.  Psychiatric: She has a normal mood and affect. Her behavior is normal. Judgment and thought content normal.       Assessment & Plan:   Problem List Items Addressed This Visit      Nervous and Auditory   Cerebral palsy (HCC)    Increased spasticity of bilateral lower extremities secondary to spasm and tightness. Start baclofen as needed for spasticity. Cerebral palsy otherwise stable and functioning well. Continues to use walker and wheelchair. Lives at home and mother indicates patient has been doing well behaviorally.        Other   Well woman exam   Relevant Orders   Ambulatory referral to Gynecology   Encounter for general adult medical examination with abnormal findings - Primary    1) Anticipatory Guidance: Discussed importance of wearing a seatbelt while driving and not texting while driving;  changing batteries in smoke detector at least once annually; wearing suntan lotion when outside; eating a balanced and moderate diet; getting physical activity at least 30 minutes per day.  2) Immunizations / Screenings / Labs:  Influenza updated today. All other immunizations are up-to-date per recommendations. Due for a cervical cancer screening with referral to gynecology placed. All other screenings are up-to-date per recommendations. Obtain CBC, CMET, and lipid profile.    Overall well exam with minimal risk factors for cardiovascular disease. She eats a nutritionally adequate, balance, and varied intake and exercises regularly. Continue other healthy lifestyle behaviors and choices. Follow-up prevention exam in 1 year. Follow-up office visit for chronic conditions.      Relevant Orders   CBC (Completed)   Comprehensive metabolic panel (Completed)   Lipid panel (Completed)    Other Visit Diagnoses    Need for influenza vaccination       Relevant Orders   Flu Vaccine QUAD 36+ mos IM (Completed)       I am having Ms. Chien start on baclofen.   Meds ordered this encounter  Medications  . baclofen (LIORESAL) 10 MG tablet    Sig: Take  0.5-1 tablets (5-10 mg total) by mouth 3 (three) times daily as needed for muscle spasms.    Dispense:  90 each    Refill:  0    Order Specific Question:   Supervising Provider    Answer:   Hillard Danker A [[id number]     Follow-up: Return in about 6 months (around 02/04/2018), or if symptoms worsen or fail to improve.   Jeanine Luz, FNP

## 2017-08-07 NOTE — Assessment & Plan Note (Signed)
1) Anticipatory Guidance: Discussed importance of wearing a seatbelt while driving and not texting while driving; changing batteries in smoke detector at least once annually; wearing suntan lotion when outside; eating a balanced and moderate diet; getting physical activity at least 30 minutes per day.  2) Immunizations / Screenings / Labs:  Influenza updated today. All other immunizations are up-to-date per recommendations. Due for a cervical cancer screening with referral to gynecology placed. All other screenings are up-to-date per recommendations. Obtain CBC, CMET, and lipid profile.    Overall well exam with minimal risk factors for cardiovascular disease. She eats a nutritionally adequate, balance, and varied intake and exercises regularly. Continue other healthy lifestyle behaviors and choices. Follow-up prevention exam in 1 year. Follow-up office visit for chronic conditions.

## 2017-08-07 NOTE — Telephone Encounter (Signed)
Pt mother called stating the pharmacy had a power outage yesterday and today and did not receive the  baclofen (LIORESAL) 10 MG tablet  Please resend to same pharmacy

## 2017-08-07 NOTE — Assessment & Plan Note (Signed)
Increased spasticity of bilateral lower extremities secondary to spasm and tightness. Start baclofen as needed for spasticity. Cerebral palsy otherwise stable and functioning well. Continues to use walker and wheelchair. Lives at home and mother indicates patient has been doing well behaviorally.

## 2017-08-08 MED ORDER — BACLOFEN 10 MG PO TABS: 5.0000 mg | ORAL_TABLET | Freq: Three times a day (TID) | ORAL | 0 refills | Status: DC | PRN

## 2017-08-08 NOTE — Telephone Encounter (Signed)
Resent to [facility]../l;mb 

## 2017-08-31 ENCOUNTER — Telehealth: Payer: Self-pay | Admitting: Family

## 2017-08-31 ENCOUNTER — Other Ambulatory Visit: Payer: Self-pay | Admitting: Family

## 2017-08-31 DIAGNOSIS — Z742 Need for assistance at home and no other household member able to render care: Secondary | ICD-10-CM

## 2017-08-31 DIAGNOSIS — G809 Cerebral palsy, unspecified: Secondary | ICD-10-CM

## 2017-08-31 NOTE — Telephone Encounter (Signed)
Pt's Mother, Shanda Bumps, wanted to check on the referral to [facility] so Chestine Spore can do the new eval so pt can get more services.  Shanda Bumps stated she has not heard anything and was wanting to make sure referral was done.  Please call Shanda Bumps to let her know status.

## 2017-09-03 NOTE — Telephone Encounter (Signed)
Spoke with pts mother to let her know referral has been put in. I have faxed over [facility] directly to [facility].

## 2017-09-08 ENCOUNTER — Other Ambulatory Visit: Payer: Self-pay | Admitting: Family

## 2017-09-10 ENCOUNTER — Encounter: Payer: BC Managed Care – PPO | Admitting: Family

## 2017-09-25 ENCOUNTER — Other Ambulatory Visit: Payer: Self-pay

## 2017-09-25 ENCOUNTER — Telehealth: Payer: Self-pay

## 2017-09-25 NOTE — Telephone Encounter (Signed)
I called the patients mother and she was not happy with having to bring Kerri Harris in to see Ashleigh. Mother got defensive and did not want to make an appointment to see Apolonio Schneidersshleigh because her daughter is already an established patient. She asked who Ashleys nurse is and I told her and she said she did not want to deal with anyone except for you and for me to have her to call you, please call mother back

## 2017-09-25 NOTE — Telephone Encounter (Signed)
Pts mom dropped off form to be signed for re-evaluation for PCA. Kerri Harris is willing to sign it since pt was recently seen but pt needs to schedule and get established with Ashleigh. Will you call pts mom and get something scheduled please. Thank you.

## 2017-09-25 NOTE — Telephone Encounter (Signed)
Spoke with pts mother and informed her that pts PCS form has been signed and faxed by [place]. I also let her know that I am aware that pt was just seen by Tammy SoursGreg for a physical and I understand due to pts condition it is not easy to get to and from office visits. I just let her know that pt would need to be seen by within the next several months incase home health sent any forms over that needed to be signed by Apolonio SchneidersAshleigh. Mother understood and an appointment to transfer from Tammy SoursGreg has been made for December.

## 2017-10-24 ENCOUNTER — Other Ambulatory Visit (HOSPITAL_COMMUNITY)
Admission: RE | Admit: 2017-10-24 | Discharge: 2017-10-24 | Disposition: A | Discharge status: Home / Self Care | Payer: Medicaid Other | Source: Ambulatory Visit | Attending: Advanced Practice Midwife | Admitting: Advanced Practice Midwife

## 2017-10-24 ENCOUNTER — Ambulatory Visit (INDEPENDENT_AMBULATORY_CARE_PROVIDER_SITE_OTHER): Payer: BLUE CROSS/BLUE SHIELD | Admitting: Advanced Practice Midwife

## 2017-10-24 DIAGNOSIS — G809 Cerebral palsy, unspecified: Secondary | ICD-10-CM

## 2017-10-24 DIAGNOSIS — K649 Unspecified hemorrhoids: Secondary | ICD-10-CM

## 2017-10-24 DIAGNOSIS — Z01419 Encounter for gynecological examination (general) (routine) without abnormal findings: Secondary | ICD-10-CM | POA: Diagnosis present

## 2017-10-24 DIAGNOSIS — Z0000 Encounter for general adult medical examination without abnormal findings: Secondary | ICD-10-CM | POA: Diagnosis not present

## 2017-10-24 DIAGNOSIS — N9489 Other specified conditions associated with female genital organs and menstrual cycle: Secondary | ICD-10-CM

## 2017-10-24 LAB — HM PAP SMEAR

## 2017-10-24 NOTE — Patient Instructions (Signed)

## 2017-10-24 NOTE — Progress Notes (Signed)
Subjective:     Patient ID: Kerri Harris, female   DOB: 03/21/1991, 26 y.o.   MRN: [id number]  HPI; Here for well-woman exam, Pap and concern about a perineal mass that has been present for several years. Pt has CP, limited mobility, limited verbal ability. Mother is present and is her full-time care-taker. Mother also is asking if there is anything to help with her daughter's menstrual periods because the bleeding and cramping are very problematic. Has tried Depo Q12 weeks in the past without improvement. Pt is not sexually active or at risk for sexual contact per mother.   Has menstrual periods monthly, mod flow, severe dysmenorrhea. Pt is very intolerant of cramps. Pap: Never had one Mammogram: Never had one. Not indicated.  Sexual activity: Never Birth control: None  Review of Systems  Gastrointestinal: Negative for abdominal pain and blood in stool.  Genitourinary: Positive for menstrual problem. Negative for dysuria, vaginal bleeding and vaginal discharge.       Perineal mass that burns when she urinates   Past Medical History:  Diagnosis Date  . Cerebral palsy (HCC)   . Seizures (HCC)    Childhood seizures - not since age 89   Past Surgical History:  Procedure Laterality Date  . feet surgery Bilateral   . HIP SURGERY Bilateral   . KNEE SURGERY Bilateral    Family History  Problem Relation Age of Onset  . Arthritis Mother   . Hypertension Maternal Grandmother   . Diabetes Maternal Grandmother    Social History   Socioeconomic History  . Marital status: Single    Spouse name: Not on file  . Number of children: 0  . Years of education: 5912  . Highest education level: Not on file  Social Needs  . Financial resource strain: Not on file  . Food insecurity - worry: Not on file  . Food insecurity - inability: Not on file  . Transportation needs - medical: Not on file  . Transportation needs - non-medical: Not on file  Occupational History  . Not on file  Tobacco Use  .  Smoking status: Never Smoker  . Smokeless tobacco: Never Used  Substance and Sexual Activity  . Alcohol use: No    Alcohol/week: 0.0 oz  . Drug use: No  . Sexual activity: Not on file  Other Topics Concern  . Not on file  Social History Narrative   Fun: Play and exercise, read   Denies religious beliefs effecting health care.        Objective:   Physical Exam  Constitutional: She appears well-developed. She is cooperative. No distress.  Underweight  Genitourinary: No labial fusion. There is no tenderness or lesion on the right labia. There is no tenderness or lesion on the left labia. No tenderness or bleeding in the vagina. No vaginal discharge found.  Genitourinary Comments: Exam severely limited by pt inability to open legs and undergo speculum or bimanual exam. Pt and and her mother consented to exam w/ pt lying on her side and to have blind Pap performed w/ cytobrush. Only able to advance brush 1.5 inches.  Mass was actually what appears to be a 4 cm anterior hemorrhoid w/ ? Small fissure.   Neurological: She is alert. She is not disoriented. She displays no tremor. She exhibits abnormal muscle tone.  Generalized spasticity   Skin: Skin is warm, dry and intact. No lesion noted.  Nursing note and vitals reviewed.      Assessment/Plan:  1. Well woman exam with routine gynecological exam  - Consulted W/ Dr. Vergie LivingPickens RE: extent of Gyn exam needed. Since pt is not having significant Gyn problems. Blind Pap w/ HPV if possible. If she develops Gyn problems in the future she will need exam under anesthesia.    - Cytology - PAP w/ HPV cotesting  2. Hemorrhoids, unspecified hemorrhoid type   - Ambulatory referral to General Surgery.   3. Cerebral Palsy  4. Menstrual Suppression  - Discussed continuously cycling OCPs vs LNG IUD (would have to be put in under anesthesia), vs Depo Q8 weeks. - Mother would like to try Depo Q8 weeks for now. - F/U in 6 months or sooner PRN.    Katrinka BlazingSmith, [place], [place] 10/24/2017 12:23 PM

## 2017-10-31 LAB — CYTOLOGY - PAP
DIAGNOSIS: NEGATIVE
HPV: NOT DETECTED

## 2017-11-14 ENCOUNTER — Encounter: Payer: Self-pay | Admitting: Nurse Practitioner

## 2017-11-14 ENCOUNTER — Ambulatory Visit (INDEPENDENT_AMBULATORY_CARE_PROVIDER_SITE_OTHER): Payer: BLUE CROSS/BLUE SHIELD | Admitting: Nurse Practitioner

## 2017-11-14 ENCOUNTER — Telehealth: Payer: Self-pay | Admitting: Nurse Practitioner

## 2017-11-14 VITALS — BP 122/80 | HR 107 | Temp 99.0°F | Ht 59.0 in | Wt 100.0 lb

## 2017-11-14 DIAGNOSIS — R739 Hyperglycemia, unspecified: Secondary | ICD-10-CM | POA: Diagnosis not present

## 2017-11-14 DIAGNOSIS — G809 Cerebral palsy, unspecified: Secondary | ICD-10-CM | POA: Diagnosis not present

## 2017-11-14 DIAGNOSIS — L209 Atopic dermatitis, unspecified: Secondary | ICD-10-CM

## 2017-11-14 LAB — POCT GLYCOSYLATED HEMOGLOBIN (HGB A1C): HEMOGLOBIN A1C: 5.3

## 2017-11-14 MED ORDER — BACLOFEN 10 MG PO TABS: 5.0000 mg | ORAL_TABLET | Freq: Three times a day (TID) | ORAL | 3 refills | Status: DC | PRN

## 2017-11-14 MED ORDER — HYDROCORTISONE 2.5 % EX CREA: TOPICAL_CREAM | Freq: Two times a day (BID) | CUTANEOUS | 0 refills | Status: DC

## 2017-11-14 NOTE — Patient Instructions (Addendum)
I have sent a prescription for hydrocortisone 2.5% cream. You may apply this 3 times daily to the dry, irritated areas. Let me know if this does not improve.  I have sent a refill of baclofen for you. Please let me know if this does not improve your muscle spasms.  Ill see you back in September for your annual physical, or sooner If you need me.  It was nice to meet you. Thanks for letting me take care of you today :)  Eczema Eczema, also called atopic dermatitis, is a skin disorder that causes inflammation of the skin. It causes a red rash and dry, scaly skin. The skin becomes very itchy. Eczema is generally worse during the cooler winter months and often improves with the warmth of summer. Eczema usually starts showing signs in infancy. Some children outgrow eczema, but it may last through adulthood. What are the causes? The exact cause of eczema is not known, but it appears to run in families. People with eczema often have a family history of eczema, allergies, asthma, or hay fever. Eczema is not contagious. Flare-ups of the condition may be caused by:  Contact with something you are sensitive or allergic to.  Stress.  What are the signs or symptoms?  Dry, scaly skin.  Red, itchy rash.  Itchiness. This may occur before the skin rash and may be very intense. How is this diagnosed? The diagnosis of eczema is usually made based on symptoms and medical history. How is this treated? Eczema cannot be cured, but symptoms usually can be controlled with treatment and other strategies. A treatment plan might include:  Controlling the itching and scratching. ? Use over-the-counter antihistamines as directed for itching. This is especially useful at night when the itching tends to be worse. ? Use over-the-counter steroid creams as directed for itching. ? Avoid scratching. Scratching makes the rash and itching worse. It may also result in a skin infection (impetigo) due to a break in the skin  caused by scratching.  Keeping the skin well moisturized with creams every day. This will seal in moisture and help prevent dryness. Lotions that contain alcohol and water should be avoided because they can dry the skin.  Limiting exposure to things that you are sensitive or allergic to (allergens).  Recognizing situations that cause stress.  Developing a plan to manage stress.  Follow these instructions at home:  Only take over-the-counter or prescription medicines as directed by your health care provider.  Do not use anything on the skin without checking with your health care provider.  Keep baths or showers short (5 minutes) in warm (not hot) water. Use mild cleansers for bathing. These should be unscented. You may add nonperfumed bath oil to the bath water. It is best to avoid soap and bubble bath.  Immediately after a bath or shower, when the skin is still damp, apply a moisturizing ointment to the entire body. This ointment should be a petroleum ointment. This will seal in moisture and help prevent dryness. The thicker the ointment, the better. These should be unscented.  Keep fingernails cut short. Children with eczema may need to wear soft gloves or mittens at night after applying an ointment.  Dress in clothes made of cotton or cotton blends. Dress lightly, because heat increases itching.  A child with eczema should stay away from anyone with fever blisters or cold sores. The virus that causes fever blisters (herpes simplex) can cause a serious skin infection in children with eczema. Contact  a health care provider if:  Your itching interferes with sleep.  Your rash gets worse or is not better within 1 week after starting treatment.  You see pus or soft yellow scabs in the rash area.  You have a fever.  You have a rash flare-up after contact with someone who has fever blisters. This information is not intended to replace advice given to you by your health care provider.  Make sure you discuss any questions you have with your health care provider. Document Released: 11/17/2000 Document Revised: 04/27/2016 Document Reviewed: 06/23/2013 Elsevier Interactive Patient Education  2017 [employer]

## 2017-11-14 NOTE — Progress Notes (Signed)
Subjective:    Patient ID: Kerri Harris, female    DOB: 06/26/1991, 26 y.o.   MRN: [id number]  HPI Ms Kerri Harris is a 26 yo female who presents today to establish care. She is transferring to me from another provider in the same clinic.She comes in today with her mother, who is her primary caregiver.  She has a significant medical history of cerebral palsy. She is mostly wheelchair bound, she can ambulate with some assistance with walker. She lives at home with her mother who is her primary caregiver. She was also receiving nursing care 5 days a week but is currently requesting renewal as her last order expired. She is able to take her medications and feed herself independently. She relies on assistance for mobility, bathing, grooming, dressing.   Muscle spasms- She was started on baclofen for muscle spasms on her last visit with her prior provider in September. She was doing well on the medication but she ran out. She does reports increasing spasms in legs and arms over past months, making it difficult to transfer from wheelchair and complete daily activities.   Dry skin- This is a new problem. This problem began months ago. She describes as darkened scaly patches to anterior chest, anterior elbows. The patches are red and scaly at times. Mom has tried mild nonscented soaps dove and thick moisturizers including Vaseline and eucerin without improvement  Elevated blood sugar- She was seen in this clinic by her prior provider in September for her annual wellness exam and her blood sugar was 117 on routine labwork. She was unsure if she was fasting prior to this lab work. She was instructed to return for a follow up A1c level. She denies polyphagia, polydipsia.  Review of Systems See HPI  Past Medical History:  Diagnosis Date  . Cerebral palsy (HCC)   . Seizures (HCC)    Childhood seizures - not since age 89     Social History   Socioeconomic History  . Marital status: Single    Spouse name:  Not on file  . Number of children: 0  . Years of education: 2012  . Highest education level: Not on file  Social Needs  . Financial resource strain: Not on file  . Food insecurity - worry: Not on file  . Food insecurity - inability: Not on file  . Transportation needs - medical: Not on file  . Transportation needs - non-medical: Not on file  Occupational History  . Not on file  Tobacco Use  . Smoking status: Never Smoker  . Smokeless tobacco: Never Used  Substance and Sexual Activity  . Alcohol use: No    Alcohol/week: 0.0 oz  . Drug use: No  . Sexual activity: Not on file  Other Topics Concern  . Not on file  Social History Narrative   Fun: Play and exercise, read   Denies religious beliefs effecting health care.     Past Surgical History:  Procedure Laterality Date  . feet surgery Bilateral   . HIP SURGERY Bilateral   . KNEE SURGERY Bilateral     Family History  Problem Relation Age of Onset  . Arthritis Mother   . Hypertension Maternal Grandmother   . Diabetes Maternal Grandmother     No Known Allergies  No current outpatient medications on file prior to visit.   No current facility-administered medications on file prior to visit.     BP 122/80 (BP Location: Right Arm, Patient Position: Sitting, Cuff Size: Normal)  Pulse (!) 107   Temp 99 F (37.2 C) (Oral)   Ht 4\' 11"  (1.499 m)   Wt 100 lb (45.4 kg)   SpO2 98%   BMI 20.20 kg/m      Objective:   Physical Exam  Constitutional: She is oriented to person, place, and time. She appears well-developed and well-nourished. No distress.  In wheelchair.  HENT:  Head: Normocephalic and atraumatic.  Cardiovascular: Regular rhythm, normal heart sounds and intact distal pulses.  Pulmonary/Chest: Effort normal and breath sounds normal. No respiratory distress.  Musculoskeletal: Normal range of motion.  BUE, BLE with Stiff spastic muscles.  Neurological: She is alert and oriented to person, place, and time.  Coordination normal.  Skin: Skin is warm and dry.  Dry scaly crusted patches to anterior chest, anterior forearms.  Psychiatric: She has a normal mood and affect. Thought content normal.  Vitals reviewed.     Assessment & Plan:  RTC for annual physical, or sooner If needed.  Hyperglycemia Elevated fasting glucose on September labs, however today the patient says she is unsure if she was fasting. a1c today is normal. No further workup recommended - POCT HgB A1C Lab Results  Component Value Date   HGBA1C 5.3 11/14/2017

## 2017-11-14 NOTE — Assessment & Plan Note (Addendum)
She needs a new referral to home health for daily needs, this is reasonable and I will try to request home nursing care 7 days/week - Ambulatory referral to Home Health  Muscle spasms were improving with baclofen but she ran out. Refill sent today. We can consider increasing dose or referral for botox injections if no relief. She will F/u if no improvement

## 2017-11-14 NOTE — Telephone Encounter (Signed)
Mother has been informed of message below.

## 2017-11-14 NOTE — Assessment & Plan Note (Signed)
History and PE consistent with atopic dermatitis. Shes had this in the past.  - hydrocortisone 2.5 % cream; Apply topically 2 (two) times daily.  Dispense: 30 g; Refill: 0 - she can apply 2 - 3 times a day to dry irritated chest and arms.  She will F/U if no improvement, well consider referral to dermatology if needed.

## 2017-11-14 NOTE — Telephone Encounter (Signed)
Phone call

## 2017-11-21 ENCOUNTER — Telehealth: Payer: Self-pay | Admitting: Nurse Practitioner

## 2017-11-21 NOTE — Telephone Encounter (Signed)
Called women's health for patient to see what was going on with referral since it was put in by [place] Smith CNM. They stated that they will follow up with patient in regards to referral and get in contact with [facility] in regards to referral.

## 2017-11-21 NOTE — Telephone Encounter (Signed)
Copied from CRM [id number]. Topic: Quick Communication - See Telephone Encounter >> Nov 21, 2017 10:39 AM Elliot GaultBell, Tiffany M wrote: CRM for notification. See Telephone encounter for:   11/21/17.  Caller name: Tiffany KocherJessica Wanzer (Mother)    Call back number: [phone]    Reason for call:  Mother states [facility] never [facility] and would like "Ladona Ridgelaylor" to return call to confirm referral was re faxed, please advise

## 2017-12-19 ENCOUNTER — Telehealth: Payer: Self-pay | Admitting: Advanced Practice Midwife

## 2017-12-19 NOTE — Telephone Encounter (Signed)
Mother calling about a surgery referral, said she never heard anything

## 2017-12-26 NOTE — Telephone Encounter (Signed)
[facility] for [facility]5 @ 1050am. Called and informed patient's mother. Patient's mother states that will not work that day as Morrie Sheldonshley already has an appt in [place]. She states she will call and reschedule the appt. She had no questions

## 2018-01-09 ENCOUNTER — Ambulatory Visit (INDEPENDENT_AMBULATORY_CARE_PROVIDER_SITE_OTHER): Payer: Medicaid Other | Admitting: *Deleted

## 2018-01-09 VITALS — BP 119/65 | HR 90

## 2018-01-09 DIAGNOSIS — Z3042 Encounter for surveillance of injectable contraceptive: Secondary | ICD-10-CM | POA: Diagnosis present

## 2018-01-09 MED ORDER — MEDROXYPROGESTERONE ACETATE 150 MG/ML IM SUSP
150.0000 mg | Freq: Once | INTRAMUSCULAR | Status: AC
  Administered 2018-01-09: 150 mg via INTRAMUSCULAR

## 2018-01-09 NOTE — Progress Notes (Signed)
Chart reviewed for nurse visit. Agree with plan of care.   Kooistra, Kathryn Lorraine, CNM 01/09/2018 1:44 PM   

## 2018-01-09 NOTE — Progress Notes (Signed)
Pt came in for 2 months Depo shot and return in 8 weeks.

## 2018-01-18 ENCOUNTER — Encounter (HOSPITAL_COMMUNITY): Payer: Self-pay | Admitting: *Deleted

## 2018-01-18 ENCOUNTER — Other Ambulatory Visit: Payer: Self-pay

## 2018-01-18 ENCOUNTER — Ambulatory Visit: Payer: Self-pay | Admitting: General Surgery

## 2018-01-18 NOTE — H&P (View-Only) (Signed)
History of Present Illness Romie Levee MD; 01/18/2018 9:28 AM) The patient is a 27 year old female who presents with hemorrhoids. 27 year old female with cerebral palsy who presents to the office with complaints of an enlarging skin tag. It has been present for several years. She has a long-standing history of constipation. She denies any rectal bleeding except for some spotting on the toilet paper. She has a bowel movement approximately once a week. She takes stool softeners daily. She complains of burning pain on her anal skin during urination.   Past Surgical History Michel Bickers, LPN; 1/61/0960 4:54 AM) Foot Surgery Bilateral. Hip Surgery Bilateral. Knee Surgery Bilateral.  Diagnostic Studies History Michel Bickers, LPN; 0/98/1191 4:78 AM) Colonoscopy never Mammogram never Pap Smear 1-5 years ago  Allergies Michel Bickers, LPN; 2/95/6213 0:86 AM) No Known Allergies [01/18/2018]:  Medication History Michel Bickers, LPN; 5/78/4696 2:95 AM) Baclofen (10MG  Tablet, Oral) Active. Medications Reconciled  Social History Michel Bickers, LPN; 2/84/1324 4:01 AM) No alcohol use No caffeine use No drug use Tobacco use Never smoker.  Family History Michel Bickers, LPN; 0/27/2536 6:44 AM) Diabetes Mellitus Family Members In General. Heart Disease Family Members In General. Heart disease in female family member before age 44 Hypertension Family Members In General.  Pregnancy / Birth History Michel Bickers, LPN; 0/34/7425 9:56 AM) Age at menarche 11 years. Contraceptive History Depo-provera. Gravida 0 Para 0 Regular periods  Other Problems Michel Bickers, LPN; 3/87/5643 3:29 AM) Bladder Problems Hemorrhoids Other disease, cancer, significant illness     Review of Systems Tresa Endo Dockery LPN; 04/21/8415 6:06 AM) General Not Present- Appetite Loss, Chills, Fatigue, Fever, Night Sweats, Weight Gain and Weight Loss. Skin Present- Dryness and Rash. Not  Present- Change in Wart/Mole, Hives, Jaundice, New Lesions, Non-Healing Wounds and Ulcer. HEENT Present- Nose Bleed. Not Present- Earache, Hearing Loss, Hoarseness, Oral Ulcers, Ringing in the Ears, Seasonal Allergies, Sinus Pain, Sore Throat, Visual Disturbances, Wears glasses/contact lenses and Yellow Eyes. Respiratory Not Present- Bloody sputum, Chronic Cough, Difficulty Breathing, Snoring and Wheezing. Breast Not Present- Breast Mass, Breast Pain, Nipple Discharge and Skin Changes. Cardiovascular Not Present- Chest Pain, Difficulty Breathing Lying Down, Leg Cramps, Palpitations, Rapid Heart Rate, Shortness of Breath and Swelling of Extremities. Gastrointestinal Present- Constipation and Hemorrhoids. Not Present- Abdominal Pain, Bloating, Bloody Stool, Change in Bowel Habits, Chronic diarrhea, Difficulty Swallowing, Excessive gas, Gets full quickly at meals, Indigestion, Nausea, Rectal Pain and Vomiting. Female Genitourinary Present- Nocturia and Urgency. Not Present- Frequency, Painful Urination and Pelvic Pain. Musculoskeletal Present- Joint Stiffness. Not Present- Back Pain, Joint Pain, Muscle Pain, Muscle Weakness and Swelling of Extremities. Psychiatric Not Present- Anxiety, Bipolar, Change in Sleep Pattern, Depression, Fearful and Frequent crying. Endocrine Not Present- Cold Intolerance, Excessive Hunger, Hair Changes, Heat Intolerance, Hot flashes and New Diabetes. Hematology Not Present- Blood Thinners, Easy Bruising, Excessive bleeding, Gland problems, HIV and Persistent Infections.  Vitals Tresa Endo Dockery LPN; 02/01/6009 9:32 AM) 01/18/2018 9:16 AM Weight: 100 lb Height: 60in Weight was reported by patient. Pre-Pregnancy Weight was reported by patient. Body Surface Area: 1.39 m Body Mass Index: 19.53 kg/m  Temp.: 98.26F(Temporal)  Pulse: 97 (Regular)  BP: 118/66 (Sitting, Left Arm, Standard)      Physical Exam Romie Levee MD; 01/18/2018 9:29 AM)  General Mental  Status-Alert. General Appearance-Not in acute distress. Build & Nutrition-Well nourished. Posture-Normal posture. Gait-Normal.  Head and Neck Head-normocephalic, atraumatic with no lesions or palpable masses. Thyroid Gland Characteristics - normal size and consistency and no palpable nodules.  Chest and Lung Exam  Chest and lung exam reveals -on auscultation, normal breath sounds, no adventitious sounds and normal vocal resonance.  Cardiovascular Cardiovascular examination reveals -normal heart sounds, regular rate and rhythm with no murmurs and no digital clubbing, cyanosis, edema, increased warmth or tenderness.  Abdomen Inspection Inspection of the abdomen reveals - No Hernias. Palpation/Percussion Palpation and Percussion of the abdomen reveal - Soft, Non Tender, No Rigidity (guarding), No hepatosplenomegaly and No Palpable abdominal masses.  Rectal Anorectal Exam External - skin tag(Large, right posterior).  Neurologic Neurologic evaluation reveals -alert and oriented x 3 with no impairment of recent or remote memory, normal attention span and ability to concentrate, normal sensation and normal coordination.  Musculoskeletal Normal Exam - Bilateral-Upper Extremity Strength Normal and Lower Extremity Strength Normal.    Assessment & Plan Romie Levee(Adryel Wortmann MD; 01/18/2018 9:30 AM)  ANAL SKIN TAG (K64.4) Impression: 27 year old female with cerebral palsy who presents to the office for evaluation of a large anal skin tag. She has a history of constipation. We discussed resection in detail. We discussed typical postoperative pain. She would like to proceed with surgery and her guardian agrees. Risks include bleeding, pain and recurrence.

## 2018-01-18 NOTE — H&P (Signed)
History of Present Illness Kerri Levee MD; 01/18/2018 9:28 AM) The patient is a 27 year old female who presents with hemorrhoids. 27 year old female with cerebral palsy who presents to the office with complaints of an enlarging skin tag. It has been present for several years. She has a long-standing history of constipation. She denies any rectal bleeding except for some spotting on the toilet paper. She has a bowel movement approximately once a week. She takes stool softeners daily. She complains of burning pain on her anal skin during urination.   Past Surgical History Kerri Bickers, LPN; 1/61/0960 4:54 AM) Foot Surgery Bilateral. Hip Surgery Bilateral. Knee Surgery Bilateral.  Diagnostic Studies History Kerri Bickers, LPN; 0/98/1191 4:78 AM) Colonoscopy never Mammogram never Pap Smear 1-5 years ago  Allergies Kerri Bickers, LPN; 2/95/6213 0:86 AM) No Known Allergies [01/18/2018]:  Medication History Kerri Bickers, LPN; 5/78/4696 2:95 AM) Baclofen (10MG  Tablet, Oral) Active. Medications Reconciled  Social History Kerri Bickers, LPN; 2/84/1324 4:01 AM) No alcohol use No caffeine use No drug use Tobacco use Never smoker.  Family History Kerri Bickers, LPN; 0/27/2536 6:44 AM) Diabetes Mellitus Family Members In General. Heart Disease Family Members In General. Heart disease in female family member before age 44 Hypertension Family Members In General.  Pregnancy / Birth History Kerri Bickers, LPN; 0/34/7425 9:56 AM) Age at menarche 11 years. Contraceptive History Depo-provera. Gravida 0 Para 0 Regular periods  Other Problems Kerri Bickers, LPN; 3/87/5643 3:29 AM) Bladder Problems Hemorrhoids Other disease, cancer, significant illness     Review of Systems Kerri Endo Dockery LPN; 04/21/8415 6:06 AM) General Not Present- Appetite Loss, Chills, Fatigue, Fever, Night Sweats, Weight Gain and Weight Loss. Skin Present- Dryness and Rash. Not  Present- Change in Wart/Mole, Hives, Jaundice, New Lesions, Non-Healing Wounds and Ulcer. HEENT Present- Nose Bleed. Not Present- Earache, Hearing Loss, Hoarseness, Oral Ulcers, Ringing in the Ears, Seasonal Allergies, Sinus Pain, Sore Throat, Visual Disturbances, Wears glasses/contact lenses and Yellow Eyes. Respiratory Not Present- Bloody sputum, Chronic Cough, Difficulty Breathing, Snoring and Wheezing. Breast Not Present- Breast Mass, Breast Pain, Nipple Discharge and Skin Changes. Cardiovascular Not Present- Chest Pain, Difficulty Breathing Lying Down, Leg Cramps, Palpitations, Rapid Heart Rate, Shortness of Breath and Swelling of Extremities. Gastrointestinal Present- Constipation and Hemorrhoids. Not Present- Abdominal Pain, Bloating, Bloody Stool, Change in Bowel Habits, Chronic diarrhea, Difficulty Swallowing, Excessive gas, Gets full quickly at meals, Indigestion, Nausea, Rectal Pain and Vomiting. Female Genitourinary Present- Nocturia and Urgency. Not Present- Frequency, Painful Urination and Pelvic Pain. Musculoskeletal Present- Joint Stiffness. Not Present- Back Pain, Joint Pain, Muscle Pain, Muscle Weakness and Swelling of Extremities. Psychiatric Not Present- Anxiety, Bipolar, Change in Sleep Pattern, Depression, Fearful and Frequent crying. Endocrine Not Present- Cold Intolerance, Excessive Hunger, Hair Changes, Heat Intolerance, Hot flashes and New Diabetes. Hematology Not Present- Blood Thinners, Easy Bruising, Excessive bleeding, Gland problems, HIV and Persistent Infections.  Vitals Kerri Endo Dockery LPN; 02/01/6009 9:32 AM) 01/18/2018 9:16 AM Weight: 100 lb Height: 60in Weight was reported by patient. Pre-Pregnancy Weight was reported by patient. Body Surface Area: 1.39 m Body Mass Index: 19.53 kg/m  Temp.: 98.26F(Temporal)  Pulse: 97 (Regular)  BP: 118/66 (Sitting, Left Arm, Standard)      Physical Exam Kerri Levee MD; 01/18/2018 9:29 AM)  General Mental  Status-Alert. General Appearance-Not in acute distress. Build & Nutrition-Well nourished. Posture-Normal posture. Gait-Normal.  Head and Neck Head-normocephalic, atraumatic with no lesions or palpable masses. Thyroid Gland Characteristics - normal size and consistency and no palpable nodules.  Chest and Lung Exam  Chest and lung exam reveals -on auscultation, normal breath sounds, no adventitious sounds and normal vocal resonance.  Cardiovascular Cardiovascular examination reveals -normal heart sounds, regular rate and rhythm with no murmurs and no digital clubbing, cyanosis, edema, increased warmth or tenderness.  Abdomen Inspection Inspection of the abdomen reveals - No Hernias. Palpation/Percussion Palpation and Percussion of the abdomen reveal - Soft, Non Tender, No Rigidity (guarding), No hepatosplenomegaly and No Palpable abdominal masses.  Rectal Anorectal Exam External - skin tag(Large, right posterior).  Neurologic Neurologic evaluation reveals -alert and oriented x 3 with no impairment of recent or remote memory, normal attention span and ability to concentrate, normal sensation and normal coordination.  Musculoskeletal Normal Exam - Bilateral-Upper Extremity Strength Normal and Lower Extremity Strength Normal.    Assessment & Plan Kerri Harris(Kerri Nault MD; 01/18/2018 9:30 AM)  ANAL SKIN TAG (K64.4) Impression: 27 year old female with cerebral palsy who presents to the office for evaluation of a large anal skin tag. She has a history of constipation. We discussed resection in detail. We discussed typical postoperative pain. She would like to proceed with surgery and her guardian agrees. Risks include bleeding, pain and recurrence.

## 2018-01-22 ENCOUNTER — Encounter (HOSPITAL_COMMUNITY)
Admission: RE | Disposition: A | Discharge status: Home / Self Care | Payer: Self-pay | Source: Ambulatory Visit | Attending: General Surgery

## 2018-01-22 ENCOUNTER — Ambulatory Visit (HOSPITAL_COMMUNITY): Payer: BLUE CROSS/BLUE SHIELD | Admitting: Registered Nurse

## 2018-01-22 ENCOUNTER — Ambulatory Visit (HOSPITAL_COMMUNITY)
Admission: RE | Admit: 2018-01-22 | Discharge: 2018-01-22 | Disposition: A | Discharge status: Home / Self Care | Payer: BLUE CROSS/BLUE SHIELD | Source: Ambulatory Visit | Attending: General Surgery | Admitting: General Surgery

## 2018-01-22 ENCOUNTER — Encounter (HOSPITAL_COMMUNITY): Payer: Self-pay

## 2018-01-22 DIAGNOSIS — Z8249 Family history of ischemic heart disease and other diseases of the circulatory system: Secondary | ICD-10-CM | POA: Insufficient documentation

## 2018-01-22 DIAGNOSIS — Z833 Family history of diabetes mellitus: Secondary | ICD-10-CM | POA: Insufficient documentation

## 2018-01-22 DIAGNOSIS — K5900 Constipation, unspecified: Secondary | ICD-10-CM | POA: Insufficient documentation

## 2018-01-22 DIAGNOSIS — Z79899 Other long term (current) drug therapy: Secondary | ICD-10-CM | POA: Diagnosis not present

## 2018-01-22 DIAGNOSIS — R569 Unspecified convulsions: Secondary | ICD-10-CM | POA: Insufficient documentation

## 2018-01-22 DIAGNOSIS — G809 Cerebral palsy, unspecified: Secondary | ICD-10-CM | POA: Diagnosis not present

## 2018-01-22 DIAGNOSIS — K644 Residual hemorrhoidal skin tags: Secondary | ICD-10-CM | POA: Diagnosis not present

## 2018-01-22 DIAGNOSIS — N329 Bladder disorder, unspecified: Secondary | ICD-10-CM | POA: Diagnosis not present

## 2018-01-22 HISTORY — PX: HEMORRHOID SURGERY: SHX153

## 2018-01-22 HISTORY — DX: Dermatitis, unspecified: L30.9

## 2018-01-22 LAB — CBC
HCT: 37.5 % (ref 36.0–46.0)
Hemoglobin: 12.6 g/dL (ref 12.0–15.0)
MCH: 28.4 pg (ref 26.0–34.0)
MCHC: 33.6 g/dL (ref 30.0–36.0)
MCV: 84.7 fL (ref 78.0–100.0)
PLATELETS: 288 10*3/uL (ref 150–400)
RBC: 4.43 MIL/uL (ref 3.87–5.11)
RDW: 13.2 % (ref 11.5–15.5)
WBC: 6.3 10*3/uL (ref 4.0–10.5)

## 2018-01-22 LAB — HCG, SERUM, QUALITATIVE: Preg, Serum: NEGATIVE

## 2018-01-22 SURGERY — HEMORRHOIDECTOMY
Anesthesia: General | Site: Anus

## 2018-01-22 MED ORDER — ACETAMINOPHEN 650 MG RE SUPP
650.0000 mg | RECTAL | Status: DC | PRN
  Filled 2018-01-22: qty 1

## 2018-01-22 MED ORDER — FENTANYL CITRATE (PF) 100 MCG/2ML IJ SOLN
25.0000 ug | INTRAMUSCULAR | Status: DC | PRN
  Administered 2018-01-22: 25 ug via INTRAVENOUS

## 2018-01-22 MED ORDER — OXYCODONE HCL 5 MG PO TABS: 5.0000 mg | ORAL_TABLET | ORAL | 0 refills | Status: DC | PRN

## 2018-01-22 MED ORDER — ACETAMINOPHEN 325 MG PO TABS: 650.0000 mg | ORAL_TABLET | ORAL | Status: DC | PRN

## 2018-01-22 MED ORDER — SUGAMMADEX SODIUM 200 MG/2ML IV SOLN
INTRAVENOUS | Status: AC
  Filled 2018-01-22: qty 2

## 2018-01-22 MED ORDER — MIDAZOLAM HCL 5 MG/5ML IJ SOLN
INTRAMUSCULAR | Status: DC | PRN
  Administered 2018-01-22: 2 mg via INTRAVENOUS

## 2018-01-22 MED ORDER — BUPIVACAINE LIPOSOME 1.3 % IJ SUSP
20.0000 mL | INTRAMUSCULAR | Status: DC
  Filled 2018-01-22: qty 20

## 2018-01-22 MED ORDER — SODIUM CHLORIDE 0.9% FLUSH: 3.0000 mL | Freq: Two times a day (BID) | INTRAVENOUS | Status: DC

## 2018-01-22 MED ORDER — LIP MEDEX EX OINT
TOPICAL_OINTMENT | CUTANEOUS | Status: AC
  Filled 2018-01-22: qty 7

## 2018-01-22 MED ORDER — FENTANYL CITRATE (PF) 250 MCG/5ML IJ SOLN
INTRAMUSCULAR | Status: AC
Start: 2018-01-22 — End: ?
  Filled 2018-01-22: qty 5

## 2018-01-22 MED ORDER — METOCLOPRAMIDE HCL 5 MG/ML IJ SOLN: 10.0000 mg | Freq: Once | INTRAMUSCULAR | Status: DC | PRN

## 2018-01-22 MED ORDER — MEPERIDINE HCL 50 MG/ML IJ SOLN: 6.2500 mg | INTRAMUSCULAR | Status: DC | PRN

## 2018-01-22 MED ORDER — FENTANYL CITRATE (PF) 100 MCG/2ML IJ SOLN
INTRAMUSCULAR | Status: AC
  Filled 2018-01-22: qty 2

## 2018-01-22 MED ORDER — ONDANSETRON HCL 4 MG/2ML IJ SOLN
INTRAMUSCULAR | Status: AC
  Filled 2018-01-22: qty 2

## 2018-01-22 MED ORDER — ROCURONIUM BROMIDE 10 MG/ML (PF) SYRINGE
PREFILLED_SYRINGE | INTRAVENOUS | Status: AC
  Filled 2018-01-22: qty 5

## 2018-01-22 MED ORDER — FENTANYL CITRATE (PF) 250 MCG/5ML IJ SOLN
INTRAMUSCULAR | Status: DC | PRN
  Administered 2018-01-22: 100 ug via INTRAVENOUS

## 2018-01-22 MED ORDER — BUPIVACAINE-EPINEPHRINE 0.25% -1:200000 IJ SOLN
INTRAMUSCULAR | Status: DC | PRN
  Administered 2018-01-22: 30 mL

## 2018-01-22 MED ORDER — SUGAMMADEX SODIUM 200 MG/2ML IV SOLN
INTRAVENOUS | Status: DC | PRN
  Administered 2018-01-22: 100 mg via INTRAVENOUS

## 2018-01-22 MED ORDER — ROCURONIUM BROMIDE 10 MG/ML (PF) SYRINGE
PREFILLED_SYRINGE | INTRAVENOUS | Status: DC | PRN
  Administered 2018-01-22: 40 mg via INTRAVENOUS

## 2018-01-22 MED ORDER — SODIUM CHLORIDE 0.9 % IV SOLN: 250.0000 mL | INTRAVENOUS | Status: DC | PRN

## 2018-01-22 MED ORDER — PROPOFOL 10 MG/ML IV BOLUS
INTRAVENOUS | Status: DC | PRN
  Administered 2018-01-22: 160 mg via INTRAVENOUS

## 2018-01-22 MED ORDER — SODIUM CHLORIDE 0.9% FLUSH: 3.0000 mL | INTRAVENOUS | Status: DC | PRN

## 2018-01-22 MED ORDER — DEXAMETHASONE SODIUM PHOSPHATE 10 MG/ML IJ SOLN
INTRAMUSCULAR | Status: AC
  Filled 2018-01-22: qty 1

## 2018-01-22 MED ORDER — BUPIVACAINE LIPOSOME 1.3 % IJ SUSP
INTRAMUSCULAR | Status: DC | PRN
  Administered 2018-01-22: 20 mL

## 2018-01-22 MED ORDER — LIDOCAINE 2% (20 MG/ML) 5 ML SYRINGE
INTRAMUSCULAR | Status: AC
  Filled 2018-01-22: qty 5

## 2018-01-22 MED ORDER — OXYCODONE HCL 5 MG PO TABS
5.0000 mg | ORAL_TABLET | ORAL | Status: DC | PRN
  Administered 2018-01-22: 5 mg via ORAL

## 2018-01-22 MED ORDER — LIDOCAINE 2% (20 MG/ML) 5 ML SYRINGE
INTRAMUSCULAR | Status: DC | PRN
  Administered 2018-01-22: 40 mg via INTRAVENOUS

## 2018-01-22 MED ORDER — DIBUCAINE 1 % RE OINT
TOPICAL_OINTMENT | RECTAL | Status: AC
  Filled 2018-01-22: qty 28

## 2018-01-22 MED ORDER — MIDAZOLAM HCL 2 MG/2ML IJ SOLN
INTRAMUSCULAR | Status: AC
  Filled 2018-01-22: qty 2

## 2018-01-22 MED ORDER — OXYCODONE HCL 5 MG PO TABS
ORAL_TABLET | ORAL | Status: AC
  Filled 2018-01-22: qty 1

## 2018-01-22 MED ORDER — LACTATED RINGERS IV SOLN
INTRAVENOUS | Status: DC
  Administered 2018-01-22: 1000 mL via INTRAVENOUS

## 2018-01-22 MED ORDER — ONDANSETRON HCL 4 MG/2ML IJ SOLN
INTRAMUSCULAR | Status: DC | PRN
  Administered 2018-01-22: 4 mg via INTRAVENOUS

## 2018-01-22 MED ORDER — BUPIVACAINE-EPINEPHRINE 0.25% -1:200000 IJ SOLN
INTRAMUSCULAR | Status: AC
  Filled 2018-01-22: qty 1

## 2018-01-22 MED ORDER — DEXAMETHASONE SODIUM PHOSPHATE 10 MG/ML IJ SOLN
INTRAMUSCULAR | Status: DC | PRN
  Administered 2018-01-22: 5 mg via INTRAVENOUS

## 2018-01-22 SURGICAL SUPPLY — 28 items
BLADE SURG 15 STRL LF DISP TIS (BLADE) ×1 IMPLANT
BLADE SURG 15 STRL SS (BLADE) ×2
BRIEF STRETCH FOR OB PAD LRG (UNDERPADS AND DIAPERS) ×3 IMPLANT
DECANTER SPIKE VIAL GLASS SM (MISCELLANEOUS) ×3 IMPLANT
DRAPE LAPAROTOMY T 102X78X121 (DRAPES) ×3 IMPLANT
ELECT PENCIL ROCKER SW 15FT (MISCELLANEOUS) ×3 IMPLANT
ELECT REM PT RETURN 15FT ADLT (MISCELLANEOUS) ×3 IMPLANT
GAUZE SPONGE 4X4 12PLY STRL (GAUZE/BANDAGES/DRESSINGS) ×3 IMPLANT
GAUZE SPONGE 4X4 16PLY XRAY LF (GAUZE/BANDAGES/DRESSINGS) ×3 IMPLANT
GLOVE BIO SURGEON STRL SZ 6.5 (GLOVE) ×2 IMPLANT
GLOVE BIO SURGEONS STRL SZ 6.5 (GLOVE) ×1
GLOVE BIOGEL PI IND STRL 7.0 (GLOVE) ×1 IMPLANT
GLOVE BIOGEL PI INDICATOR 7.0 (GLOVE) ×2
GOWN STRL REUS W/TWL 2XL LVL3 (GOWN DISPOSABLE) ×3 IMPLANT
GOWN STRL REUS W/TWL XL LVL3 (GOWN DISPOSABLE) ×6 IMPLANT
KIT BASIN OR (CUSTOM PROCEDURE TRAY) ×3 IMPLANT
LUBRICANT JELLY K Y 4OZ (MISCELLANEOUS) ×3 IMPLANT
NEEDLE HYPO 22GX1.5 SAFETY (NEEDLE) ×3 IMPLANT
PACK BASIC VI WITH GOWN DISP (CUSTOM PROCEDURE TRAY) ×3 IMPLANT
PAD ABD 7.5X8 STRL (GAUZE/BANDAGES/DRESSINGS) ×3 IMPLANT
SHEARS HARMONIC 9CM CVD (BLADE) IMPLANT
SPONGE SURGIFOAM ABS GEL 100 (HEMOSTASIS) IMPLANT
SUT CHROMIC 2 0 SH (SUTURE) ×3 IMPLANT
SUT CHROMIC 3 0 SH 27 (SUTURE) ×6 IMPLANT
SYR 20CC LL (SYRINGE) ×3 IMPLANT
TOWEL OR 17X26 10 PK STRL BLUE (TOWEL DISPOSABLE) ×3 IMPLANT
TOWEL OR NON WOVEN STRL DISP B (DISPOSABLE) ×3 IMPLANT
YANKAUER SUCT BULB TIP 10FT TU (MISCELLANEOUS) ×3 IMPLANT

## 2018-01-22 NOTE — Interval H&P Note (Signed)
History and Physical Interval Note:  01/22/2018 8:05 AM  Kerri MittsAshley Raffety  has presented today for surgery, with the diagnosis of symptomatic anal skin tag  The various methods of treatment have been discussed with the patient and family. After consideration of risks, benefits and other options for treatment, the patient has consented to  Procedure(s): HEMORRHOIDECTOMY SINGLE COLUMN (N/A) as a surgical intervention .  The patient's history has been reviewed, patient examined, no change in status, stable for surgery.  I have reviewed the patient's chart and labs.  Questions were answered to the patient's satisfaction.     Vanita PandaAlicia C Laquida Cotrell, MD  Colorectal and General Surgery [facility]

## 2018-01-22 NOTE — Anesthesia Preprocedure Evaluation (Signed)
Anesthesia Evaluation  Patient identified by MRN, date of birth, ID band Patient awake    Reviewed: Allergy & Precautions, NPO status , Patient's Chart, lab work & pertinent test results  Airway Mallampati: II  TM Distance: >3 FB Neck ROM: Full    Dental no notable dental hx. (+) Teeth Intact   Pulmonary neg pulmonary ROS,    Pulmonary exam normal breath sounds clear to auscultation       Cardiovascular negative cardio ROS Normal cardiovascular exam Rhythm:Regular Rate:Normal     Neuro/Psych Seizures -, Well Controlled,  Cerebral palsy negative neurological ROS  negative psych ROS   GI/Hepatic Neg liver ROS, Hemorrhoids    Endo/Other  negative endocrine ROS  Renal/GU negative Renal ROS  negative genitourinary   Musculoskeletal negative musculoskeletal ROS (+)   Abdominal   Peds  Hematology negative hematology ROS (+)   Anesthesia Other Findings   Reproductive/Obstetrics                             Anesthesia Physical Anesthesia Plan  ASA: III  Anesthesia Plan: General   Post-op Pain Management:    Induction: Intravenous  PONV Risk Score and Plan: 4 or greater and Dexamethasone, Ondansetron, Midazolam and Treatment may vary due to age or medical condition  Airway Management Planned: Oral ETT and LMA  Additional Equipment:   Intra-op Plan:   Post-operative Plan: Extubation in OR  Informed Consent: I have reviewed the patients History and Physical, chart, labs and discussed the procedure including the risks, benefits and alternatives for the proposed anesthesia with the patient or authorized representative who has indicated his/her understanding and acceptance.   Dental advisory given  Plan Discussed with: CRNA, Anesthesiologist and Surgeon  Anesthesia Plan Comments:         Anesthesia Quick Evaluation

## 2018-01-22 NOTE — Anesthesia Procedure Notes (Signed)
Procedure Name: Intubation Date/Time: 01/22/2018 9:45 AM Performed by: Talbot Grumbling, CRNA Pre-anesthesia Checklist: Patient identified, Emergency Drugs available, Suction available and Patient being monitored Patient Re-evaluated:Patient Re-evaluated prior to induction Oxygen Delivery Method: Circle system utilized Preoxygenation: Pre-oxygenation with 100% oxygen Induction Type: IV induction Ventilation: Mask ventilation without difficulty Laryngoscope Size: Mac and 3 Grade View: Grade I Tube type: Oral Tube size: 7.0 mm Airway Equipment and Method: Stylet Placement Confirmation: ETT inserted through vocal cords under direct vision,  positive ETCO2 and breath sounds checked- equal and bilateral Secured at: 21 cm Tube secured with: Tape Dental Injury: Teeth and Oropharynx as per pre-operative assessment

## 2018-01-22 NOTE — Transfer of Care (Signed)
Immediate Anesthesia Transfer of Care Note  Patient: Kerri Harris  Procedure(s) Performed: HEMORRHOIDECTOMY SINGLE COLUMN (N/A Anus)  Patient Location: PACU  Anesthesia Type:General  Level of Consciousness: sedated  Airway & Oxygen Therapy: Patient Spontanous Breathing and Patient connected to face mask oxygen  Post-op Assessment: Report given to RN and Post -op Vital signs reviewed and stable  Post vital signs: Reviewed and stable  Last Vitals: There were no vitals filed for this visit.  Last Pain: There were no vitals filed for this visit.    Patients Stated Pain Goal: 4 (01/22/18 0748)  Complications: No apparent anesthesia complications

## 2018-01-22 NOTE — Op Note (Signed)
01/22/2018  10:17 AM  PATIENT:  Kerri Harris  27 y.o. female  Patient Care Team: Evaristo BuryShambley, Ashleigh N, NP as PCP - General (Nurse Practitioner)  PRE-OPERATIVE DIAGNOSIS:  symptomatic anal skin tag and hemorrhoid  POST-OPERATIVE DIAGNOSIS:  symptomatic anal skin tag and hemorhhoid  PROCEDURE:  HEMORRHOIDECTOMY SINGLE COLUMN  Surgeon(s): Romie Leveehomas, Nedim Oki, MD  ASSISTANT: none   ANESTHESIA:   local and general  SPECIMEN:  Source of Specimen:  hemorrhoid  DISPOSITION OF SPECIMEN:  PATHOLOGY  COUNTS:  YES  PLAN OF CARE: Discharge to home after PACU  PATIENT DISPOSITION:  PACU - hemodynamically stable.  INDICATION: 27 y.o. F with symptomatic external hemorrhoids   OR FINDINGS: large anal skin tag with small underlying hemorrhoid  DESCRIPTION: the patient was identified in the preoperative holding area and taken to the OR where they were laid on the operating room table.  General anesthesia was induced without difficulty. The patient was then positioned in prone jackknife position with buttocks gently taped apart.  The patient was then prepped and draped in usual sterile fashion.  SCDs were noted to be in place prior to the initiation of anesthesia. A surgical timeout was performed indicating the correct patient, procedure, positioning and need for preoperative antibiotics.  A rectal block was performed using Marcaine with epinephrine mixed with Experel.    I began with a digital rectal exam.  There were no palpable masses.  I then placed a Hill-Ferguson anoscope into the anal canal and evaluated this completely.  There was minimal internal hemorrhoid disease.  Patient had a large anterior skin tag.  I began in the right anterior position dividing the skin tag using Metzenbaum scissors.  This was separated from the sphincter complex and continued down into the anal canal to excise the entire right anterior hemorrhoid column.  The anoderm was then reapproximated using a running 2-0  chromic suture.  I resected the remainder of the anterior skin tag out towards the patient's left anterior perianal region.  I then closed the external portion with interrupted and running 3-0 chromic sutures.  Additional local anesthesia was applied as well as lidocaine ointment.  A sterile dressing was applied.  The patient was awakened from anesthesia and sent to the postanesthesia care unit in stable condition.  All counts were correct per operating room staff.

## 2018-01-22 NOTE — Anesthesia Postprocedure Evaluation (Signed)
Anesthesia Post Note  Patient: Kerri Harris  Procedure(s) Performed: HEMORRHOIDECTOMY SINGLE COLUMN (N/A Anus)     Patient location during evaluation: PACU Anesthesia Type: General Level of consciousness: awake and alert and oriented Pain management: pain level controlled Vital Signs Assessment: post-procedure vital signs reviewed and stable Respiratory status: spontaneous breathing, nonlabored ventilation and respiratory function stable Cardiovascular status: stable and blood pressure returned to baseline Postop Assessment: no apparent nausea or vomiting Anesthetic complications: no    Last Vitals:  Vitals:   01/22/18 1030 01/22/18 1045  BP: 137/77 138/74  Pulse: (!) 135 (!) 109  Resp: 13 (!) 22  Temp: 36.5 C   SpO2: 100% 97%    Last Pain: There were no vitals filed for this visit.               Gean Laursen A.

## 2018-01-22 NOTE — Discharge Instructions (Signed)
ANORECTAL SURGERY: POST OP INSTRUCTIONS °1. Take your usually prescribed home medications unless otherwise directed. °2. DIET: During the first few hours after surgery sip on some liquids until you are able to urinate.  It is normal to not urinate for several hours after this surgery.  If you feel uncomfortable, please contact the office for instructions.  After you are able to urinate,you may eat, if you feel like it.  Follow a light bland diet the first 24 hours after arrival home, such as soup, liquids, crackers, etc.  Be sure to include lots of fluids daily (6-8 glasses).  Avoid fast food or heavy meals, as your are more likely to get nauseated.  Eat a low fat diet the next few days after surgery.  Limit caffeine intake to 1-2 servings a day. °3. PAIN CONTROL: °a. Pain is best controlled by a usual combination of several different methods TOGETHER: °i. Muscle relaxation: Soak in a warm bath (or Sitz bath) three times a day and after bowel movements.  Continue to do this until all pain is resolved. °ii. Over the counter pain medication °iii. Prescription pain medication °b. Most patients will experience some swelling and discomfort in the anus/rectal area and incisions.  Heat such as warm towels, sitz baths, warm baths, etc to help relax tight/sore spots and speed recovery.  Some people prefer to use ice, especially in the first couple days after surgery, as it may decrease the pain and swelling, or alternate between ice & heat.  Experiment to what works for you.  Swelling and bruising can take several weeks to resolve.  Pain can take even longer to completely resolve. °c. It is helpful to take an over-the-counter pain medication regularly for the first few weeks.  Choose one of the following that works best for you: °i. Naproxen (Aleve, etc)  Two 220mg tabs twice a day °ii. Ibuprofen (Advil, etc) Three 200mg tabs four times a day (every meal & bedtime) °d. A  prescription for pain medication (such as percocet,  oxycodone, hydrocodone, etc) should be given to you upon discharge.  Take your pain medication as prescribed.  °i. If you are having problems/concerns with the prescription medicine (does not control pain, nausea, vomiting, rash, itching, etc), please call us [phone] to see if we need to switch you to a different pain medicine that will work better for you and/or control your side effect better. °ii. If you need a refill on your pain medication, please contact your pharmacy.  They will contact our office to request authorization. Prescriptions will not be filled after 5 pm or on week-ends. °4. KEEP YOUR BOWELS REGULAR and AVOID CONSTIPATION °a. The goal is one to two soft bowel movements a day.  You should at least have a bowel movement every other day. °b. Avoid getting constipated.  Between the surgery and the pain medications, it is common to experience some constipation. This can be very painful after rectal surgery.  Increasing fluid intake and taking a fiber supplement (such as Metamucil, Citrucel, FiberCon, etc) 1-2 times a day regularly will usually help prevent this problem from occurring.  A stool softener like colace is also recommended.  This can be purchased over the counter at your pharmacy.  You can take it up to 3 times a day.  If you do not have a bowel movement after 24 hrs since your surgery, take one does of milk of magnesia.  If you still haven't had a bowel movement 8-12 hours after   that dose, take another dose.  If you don't have a bowel movement 48 hrs after surgery, purchase a Fleets enema from the drug store and administer gently per package instructions.  If you still are having trouble with your bowel movements after that, please call the office for further instructions. °c. If you develop diarrhea or have many loose bowel movements, simplify your diet to bland foods & liquids for a few days.  Stop any stool softeners and decrease your fiber supplement.  Switching to mild  anti-diarrheal medications (Kayopectate, Pepto Bismol) can help.  If this worsens or does not improve, please call us. ° °5. Wound Care °a. Remove your bandages before your first bowel movement or 8 hours after surgery.     °b. Remove any wound packing material at this tim,e as well.  You do not need to repack the wound unless instructed otherwise.  Wear an absorbent pad or soft cotton gauze in your underwear to catch any drainage and help keep the area clean. You should change this every 2-3 hours while awake. °c. Keep the area clean and dry.  Bathe / shower every day, especially after bowel movements.  Keep the area clean by showering / bathing over the incision / wound.   It is okay to soak an open wound to help wash it.  Wet wipes or showers / gentle washing after bowel movements is often less traumatic than regular toilet paper. °d. You may have some styrofoam-like soft packing in the rectum which will come out with the first bowel movement.  °e. You will often notice bleeding with bowel movements.  This should slow down by the end of the first week of surgery °f. Expect some drainage.  This should slow down, too, by the end of the first week of surgery.  Wear an absorbent pad or soft cotton gauze in your underwear until the drainage stops. °g. Do Not sit on a rubber or pillow ring.  This can make you symptoms worse.  You may sit on a soft pillow if needed.  °6. ACTIVITIES as tolerated:   °a. You may resume regular (light) daily activities beginning the next day--such as daily self-care, walking, climbing stairs--gradually increasing activities as tolerated.  If you can walk 30 minutes without difficulty, it is safe to try more intense activity such as jogging, treadmill, bicycling, low-impact aerobics, swimming, etc. °b. Save the most intensive and strenuous activity for last such as sit-ups, heavy lifting, contact sports, etc  Refrain from any heavy lifting or straining until you are off narcotics for pain  control.   °c. You may drive when you are no longer taking prescription pain medication, you can comfortably sit for long periods of time, and you can safely maneuver your car and apply brakes. °d. You may have sexual intercourse when it is comfortable.  °7. FOLLOW UP in our office °a. Please call [facility] at [phone] to set up an appointment to see your surgeon in the office for a follow-up appointment approximately 3-4 weeks after your surgery. °b. Make sure that you call for this appointment the day you arrive home to insure a convenient appointment time. °10. IF YOU HAVE DISABILITY OR FAMILY LEAVE FORMS, BRING THEM TO THE OFFICE FOR PROCESSING.  DO NOT GIVE THEM TO YOUR DOCTOR. ° ° ° ° °WHEN TO CALL US [phone]: °1. Poor pain control °2. Reactions / problems with new medications (rash/itching, nausea, etc)  °3. Fever over 101.5 F (38.5 C) °4.   Inability to urinate °5. Nausea and/or vomiting °6. Worsening swelling or bruising °7. Continued bleeding from incision. °8. Increased pain, redness, or drainage from the incision ° °The clinic staff is available to answer your questions during regular business hours (8:30am-5pm).  Please don’t hesitate to call and ask to speak to one of our nurses for clinical concerns.   A surgeon from [facility] is always on call at the hospitals °  °If you have a medical emergency, go to the nearest emergency room or call 911. °  ° °[facility] °[address], [address], [place],   [address] ? °MAIN: [phone] ? TOLL FREE: [phone] ? °FAX [phone] °[email or web address] ° ° ° °

## 2018-01-23 ENCOUNTER — Encounter (HOSPITAL_COMMUNITY): Payer: Self-pay | Admitting: General Surgery

## 2018-01-24 ENCOUNTER — Encounter (HOSPITAL_COMMUNITY): Payer: Self-pay

## 2018-01-30 ENCOUNTER — Telehealth: Payer: Self-pay

## 2018-01-30 ENCOUNTER — Telehealth: Payer: Self-pay | Admitting: Nurse Practitioner

## 2018-01-30 NOTE — Telephone Encounter (Signed)
Called [facility] to follow up - they are needing new request - request has been filled out and will be faxed today and they should hear from them next week Pt's mom is aware  Copied from CRM [phone]. Topic: Referral - Status >> Jan 30, 2018 12:55 PM Waymon AmatoBurton, Donna F wrote: Reason for CRM:pt mom is calling to check status of the referral to [facility] for PCA sevies best number to call is [phone]

## 2018-01-30 NOTE — Telephone Encounter (Signed)
Patient care assistance form has been faxed over to [facility].

## 2018-03-06 ENCOUNTER — Ambulatory Visit (INDEPENDENT_AMBULATORY_CARE_PROVIDER_SITE_OTHER): Payer: BLUE CROSS/BLUE SHIELD | Admitting: *Deleted

## 2018-03-06 VITALS — BP 128/76 | HR 90

## 2018-03-06 DIAGNOSIS — Z3042 Encounter for surveillance of injectable contraceptive: Secondary | ICD-10-CM | POA: Diagnosis not present

## 2018-03-06 MED ORDER — MEDROXYPROGESTERONE ACETATE 150 MG/ML IM SUSP
150.0000 mg | Freq: Once | INTRAMUSCULAR | Status: AC
  Administered 2018-03-06: 150 mg via INTRAMUSCULAR

## 2018-03-06 NOTE — Progress Notes (Signed)
Kerri Harris here for Depo-Provera  Injection.  Injection administered without complication. Patient will return in 2 months for next injection.  Garret ReddishBarnes, Peytin Dechert M, RN 03/06/2018  9:21 AM

## 2018-03-13 NOTE — Progress Notes (Signed)
I reviewed the note and agree with the nursing assessment and plan.   Leronda Lewers, CNM 03/01/2018 10:32 AM   

## 2018-05-01 ENCOUNTER — Other Ambulatory Visit: Payer: Self-pay | Admitting: Nurse Practitioner

## 2018-05-01 DIAGNOSIS — G809 Cerebral palsy, unspecified: Secondary | ICD-10-CM

## 2018-05-06 ENCOUNTER — Ambulatory Visit (INDEPENDENT_AMBULATORY_CARE_PROVIDER_SITE_OTHER): Payer: BLUE CROSS/BLUE SHIELD

## 2018-05-06 ENCOUNTER — Other Ambulatory Visit: Payer: Self-pay

## 2018-05-06 DIAGNOSIS — Z3042 Encounter for surveillance of injectable contraceptive: Secondary | ICD-10-CM

## 2018-05-06 MED ORDER — MEDROXYPROGESTERONE ACETATE 150 MG/ML IM SUSP
150.0000 mg | Freq: Once | INTRAMUSCULAR | Status: AC
  Administered 2018-05-06: 150 mg via INTRAMUSCULAR

## 2018-05-06 NOTE — Progress Notes (Signed)
I have reviewed the chart and agree with nursing staff's documentation of this patient's encounter.  Heather Hogan, CNM 05/06/2018 12:04 PM   

## 2018-05-06 NOTE — Progress Notes (Signed)
Patient here today for Depo-provera injection. Date last pap: 10/24/2017. Last Depo-Provera: 03/06/2018. Side Effects if any: none. Serum HCG indicated? n/a. Depo-Provera 150 mg IM given by: S.Shields Pautz,CMA(AAMA). Next appointment due 8/19-9/2.

## 2018-06-11 ENCOUNTER — Other Ambulatory Visit: Payer: Self-pay | Admitting: Nurse Practitioner

## 2018-06-11 DIAGNOSIS — G809 Cerebral palsy, unspecified: Secondary | ICD-10-CM

## 2018-07-05 ENCOUNTER — Other Ambulatory Visit: Payer: Self-pay | Admitting: Nurse Practitioner

## 2018-07-05 DIAGNOSIS — G809 Cerebral palsy, unspecified: Secondary | ICD-10-CM

## 2018-07-08 ENCOUNTER — Ambulatory Visit (INDEPENDENT_AMBULATORY_CARE_PROVIDER_SITE_OTHER): Payer: BLUE CROSS/BLUE SHIELD | Admitting: *Deleted

## 2018-07-08 VITALS — BP 122/91 | HR 97

## 2018-07-08 DIAGNOSIS — Z3042 Encounter for surveillance of injectable contraceptive: Secondary | ICD-10-CM

## 2018-07-08 MED ORDER — MEDROXYPROGESTERONE ACETATE 150 MG/ML IM SUSP
150.0000 mg | Freq: Once | INTRAMUSCULAR | Status: AC
  Administered 2018-07-08: 150 mg via INTRAMUSCULAR

## 2018-07-08 NOTE — Progress Notes (Signed)
Per note from Kerri Harris, CNM depo every 8 weeks.

## 2018-07-10 NOTE — Progress Notes (Signed)
I reviewed the note and agree with the nursing assessment and plan.   Jefte Carithers, CNM 03/01/2018 10:32 AM   

## 2018-07-31 ENCOUNTER — Other Ambulatory Visit: Payer: Self-pay | Admitting: Nurse Practitioner

## 2018-07-31 DIAGNOSIS — G809 Cerebral palsy, unspecified: Secondary | ICD-10-CM

## 2018-08-09 ENCOUNTER — Other Ambulatory Visit: Payer: Self-pay | Admitting: Nurse Practitioner

## 2018-08-09 ENCOUNTER — Telehealth: Payer: Self-pay | Admitting: Nurse Practitioner

## 2018-08-09 DIAGNOSIS — G809 Cerebral palsy, unspecified: Secondary | ICD-10-CM

## 2018-08-09 MED ORDER — BACLOFEN 10 MG PO TABS: ORAL_TABLET | ORAL | 0 refills | Status: DC

## 2018-08-09 NOTE — Telephone Encounter (Signed)
Form placed in PCP's folder for review/signature.

## 2018-08-09 NOTE — Telephone Encounter (Signed)
Pt's Mother, Shanda Bumps, emailed [place] Certification of Disability Tax Exclusion Form for completion by [employer]. Once form is completed, please call Shanda Bumps and she will come pick it up [phone].  Form placed in Ashleigh's box for pick up & completion.

## 2018-08-09 NOTE — Addendum Note (Signed)
Addended by: Merrilyn Puma on: 08/09/2018 11:21 AM   Modules accepted: Orders

## 2018-08-12 NOTE — Telephone Encounter (Signed)
Form completed. I called pt's mother. She requests form be mailed to patient's home address.

## 2018-09-02 ENCOUNTER — Ambulatory Visit (INDEPENDENT_AMBULATORY_CARE_PROVIDER_SITE_OTHER): Payer: BLUE CROSS/BLUE SHIELD | Admitting: General Practice

## 2018-09-02 VITALS — BP 111/73 | HR 90 | Ht 59.0 in | Wt 100.0 lb

## 2018-09-02 DIAGNOSIS — Z3042 Encounter for surveillance of injectable contraceptive: Secondary | ICD-10-CM | POA: Diagnosis not present

## 2018-09-02 DIAGNOSIS — N946 Dysmenorrhea, unspecified: Secondary | ICD-10-CM

## 2018-09-02 MED ORDER — MEDROXYPROGESTERONE ACETATE 150 MG/ML IM SUSP
150.0000 mg | Freq: Once | INTRAMUSCULAR | Status: AC
  Administered 2018-09-02: 150 mg via INTRAMUSCULAR

## 2018-09-02 NOTE — Progress Notes (Signed)
I have reviewed the chart and agree with nursing staff's documentation of this patient's encounter.  Thressa Sheller, CNM 09/02/2018 10:20 AM

## 2018-09-02 NOTE — Progress Notes (Signed)
Kerri Harris here for Depo-Provera  Injection.  Injection administered without complication. Patient will return in 8 weeks for next injection per [name]'s note 10/2017.  Marylynn Pearson, RN 09/02/2018  9:23 AM

## 2018-10-30 ENCOUNTER — Ambulatory Visit: Payer: BLUE CROSS/BLUE SHIELD | Admitting: Advanced Practice Midwife

## 2018-11-07 ENCOUNTER — Ambulatory Visit (INDEPENDENT_AMBULATORY_CARE_PROVIDER_SITE_OTHER): Payer: BLUE CROSS/BLUE SHIELD | Admitting: Nurse Practitioner

## 2018-11-07 ENCOUNTER — Encounter: Payer: Self-pay | Admitting: Nurse Practitioner

## 2018-11-07 VITALS — BP 113/66 | HR 95 | Ht 59.0 in | Wt 100.0 lb

## 2018-11-07 DIAGNOSIS — G809 Cerebral palsy, unspecified: Secondary | ICD-10-CM | POA: Diagnosis not present

## 2018-11-07 DIAGNOSIS — Z0000 Encounter for general adult medical examination without abnormal findings: Secondary | ICD-10-CM | POA: Diagnosis not present

## 2018-11-07 DIAGNOSIS — Z3042 Encounter for surveillance of injectable contraceptive: Secondary | ICD-10-CM | POA: Diagnosis not present

## 2018-11-07 MED ORDER — MEDROXYPROGESTERONE ACETATE 150 MG/ML IM SUSP
150.0000 mg | Freq: Once | INTRAMUSCULAR | Status: AC
  Administered 2018-11-07: 150 mg via INTRAMUSCULAR
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
## 2018-11-07 NOTE — Addendum Note (Signed)
Addended by: Henrietta DineNEAL, Shahzain Kiester S on: 11/07/2018 05:10 PM   Modules accepted: Orders

## 2018-11-07 NOTE — Progress Notes (Signed)
GYNECOLOGY ANNUAL PREVENTATIVE CARE ENCOUNTER NOTE  Subjective:   Kerri Harris is a 27 y.o. G0P0000 female here for a routine annual gynecologic exam.  Current complaints: none - here for Depo.  Still has monthly menses - light for 5 days and does not have cramping (like she had with menses when not on Depo) but needs to come every 8 weeks as she has been doing to keep the vaginal bleeding under control.   Denies abnormal vaginal bleeding, discharge, pelvic pain, problems with intercourse or other gynecologic concerns.    Gynecologic History Patient's last menstrual period was 10/31/2018. Contraception: Depo-Provera injections Last Pap: 2018. Results were: normal  Obstetric History OB History  Gravida Para Term Preterm AB Living  0 0 0 0 0 0  SAB TAB Ectopic Multiple Live Births  0 0 0 0 0    Past Medical History:  Diagnosis Date  . Cerebral palsy (HCC)   . Eczema    comes and goes per mother   . Seizures (HCC)    Childhood seizures - not since age 69    Past Surgical History:  Procedure Laterality Date  . feet surgery Bilateral   . HEMORRHOID SURGERY N/A 01/22/2018   Procedure: HEMORRHOIDECTOMY SINGLE COLUMN;  Surgeon: Romie Levee, MD;  Location: [facility];  Service: General;  Laterality: N/A;  . HIP SURGERY Bilateral   . KNEE SURGERY Bilateral     Current Outpatient Medications on File Prior to Visit  Medication Sig Dispense Refill  . baclofen (LIORESAL) 10 MG tablet TAKE 1 TABLET BY MOUTH THREE TIMES A DAY AS NEEDED FOR MUSCLE SPASMS 270 tablet 0  . medroxyPROGESTERone (DEPO-PROVERA) 150 MG/ML injection Inject 150 mg into the muscle See admin instructions. Inject 150 mg IM every 2 months    . acetaminophen (TYLENOL) 500 MG tablet Take 500 mg by mouth every 6 (six) hours as needed for moderate pain or headache.    . oxyCODONE (OXY IR/ROXICODONE) 5 MG immediate release tablet Take 1 tablet (5 mg total) by mouth every 4 (four) hours as needed. (Patient not taking:  Reported on 07/08/2018) 30 tablet 0   No current facility-administered medications on file prior to visit.     No Known Allergies  Social History   Socioeconomic History  . Marital status: Single    Spouse name: Not on file  . Number of children: 0  . Years of education: 66  . Highest education level: Not on file  Occupational History  . Not on file  Social Needs  . Financial resource strain: Not on file  . Food insecurity:    Worry: Not on file    Inability: Not on file  . Transportation needs:    Medical: Not on file    Non-medical: Not on file  Tobacco Use  . Smoking status: Never Smoker  . Smokeless tobacco: Never Used  Substance and Sexual Activity  . Alcohol use: No    Alcohol/week: 0.0 standard drinks  . Drug use: No  . Sexual activity: Never  Lifestyle  . Physical activity:    Days per week: Not on file    Minutes per session: Not on file  . Stress: Not on file  Relationships  . Social connections:    Talks on phone: Not on file    Gets together: Not on file    Attends religious service: Not on file    Active member of club or organization: Not on file    Attends meetings  of clubs or organizations: Not on file    Relationship status: Not on file  . Intimate partner violence:    Fear of current or ex partner: Not on file    Emotionally abused: Not on file    Physically abused: Not on file    Forced sexual activity: Not on file  Other Topics Concern  . Not on file  Social History Narrative   Fun: Play and exercise, read   Denies religious beliefs effecting health care.     Family History  Problem Relation Age of Onset  . Arthritis Mother   . Hypertension Maternal Grandmother   . Diabetes Maternal Grandmother     The following portions of the patient's history were reviewed and updated as appropriate: allergies, current medications, past family history, past medical history, past social history, past surgical history and problem list.  Review of  Systems Pertinent items noted in HPI and remainder of comprehensive ROS otherwise negative.   Objective:  BP 113/66   Pulse 95   Ht 4\' 11"  (1.499 m)   Wt 100 lb (45.4 kg)   LMP 10/31/2018   BMI 20.20 kg/m  CONSTITUTIONAL: Well-developed, well-nourished female in no acute distress.  HENT:  Normocephalic, atraumatic,  EYES: Conjunctivae and EOM are normal. Pupils are equal, round.  No scleral icterus.  NECK: Normal range of motion, supple, no masses.  Normal thyroid.  SKIN: Skin is warm and dry. No rash noted. Not diaphoretic. No erythema. No pallor. NEUROLOGIC: Alert and oriented to person, place, and time. Likely has some intellectual delays - watches Sponge[employer] and [place] TV programs PSYCHIATRIC: Pleasant affect. Answers questions.   CARDIOVASCULAR: Normal heart rate noted, regular rhythm RESPIRATORY: Clear to auscultation bilaterally. Effort and breath sounds normal, no problems with respiration noted. BREASTS: deferred ABDOMEN: Soft, no distention noted.  No tenderness, rebound or guarding.  PELVIC: deferred MUSCULOSKELETAL: Limited range of motion.Wheelchair bound.  Assessment and Plan:  1. Encounter for well woman exam without gynecological exam Declines breast exam today - mother reports she checks her breasts regularly in the shower.   Pap not due for 2 more years.  2. Surveillance for Depo-Provera contraception Gets Depo every 8 weeks as she still has monthly menses for 5 days on Depo.  Depo to be given today.  3. Cerebral palsy, unspecified type (HCC) Wheelchair bound and is not able to extend legs fully.    Routine preventative health maintenance measures emphasized. Please refer to After Visit Summary for other counseling recommendations.    Nolene BernheimERRI BURLESON, RN, MSN, NP-BC Nurse Practitioner, [facility]

## 2018-11-18 ENCOUNTER — Telehealth: Payer: Self-pay | Admitting: Nurse Practitioner

## 2018-11-18 NOTE — Telephone Encounter (Signed)
Pt's Mother, Shanda BumpsJessica, completed form from [employer] and left it for Ashleigh to complete her section.  Once Apolonio Schneidersshleigh has completed her portion, please call Shanda BumpsJessica at [phone] and she will come pick it up.Form put in Brittany's box for completion.

## 2018-11-20 NOTE — Telephone Encounter (Signed)
Forms have been completed &signed, Copy sent to scan.   Shanda BumpsJessica informed and will pick up original.

## 2018-12-30 ENCOUNTER — Ambulatory Visit (INDEPENDENT_AMBULATORY_CARE_PROVIDER_SITE_OTHER): Payer: Managed Care, Other (non HMO)

## 2018-12-30 VITALS — BP 115/62 | HR 101

## 2018-12-30 DIAGNOSIS — Z3042 Encounter for surveillance of injectable contraceptive: Secondary | ICD-10-CM

## 2018-12-30 MED ORDER — MEDROXYPROGESTERONE ACETATE 150 MG/ML IM SUSP
150.0000 mg | Freq: Once | INTRAMUSCULAR | Status: AC
  Administered 2018-12-30: 150 mg via INTRAMUSCULAR

## 2018-12-30 NOTE — Progress Notes (Signed)
Kerri Harris here for Depo-Provera  Injection.  Injection administered without complication. Patient will return in 3 months for next injection.  Ernestina Patches, CMA 12/30/2018  4:56 PM

## 2019-01-02 ENCOUNTER — Ambulatory Visit: Payer: BLUE CROSS/BLUE SHIELD

## 2019-03-18 ENCOUNTER — Ambulatory Visit (INDEPENDENT_AMBULATORY_CARE_PROVIDER_SITE_OTHER): Payer: Managed Care, Other (non HMO)

## 2019-03-18 ENCOUNTER — Ambulatory Visit: Payer: Managed Care, Other (non HMO)

## 2019-03-18 ENCOUNTER — Other Ambulatory Visit: Payer: Self-pay

## 2019-03-18 ENCOUNTER — Telehealth: Payer: Self-pay | Admitting: Family Medicine

## 2019-03-18 DIAGNOSIS — Z3042 Encounter for surveillance of injectable contraceptive: Secondary | ICD-10-CM | POA: Diagnosis not present

## 2019-03-18 MED ORDER — MEDROXYPROGESTERONE ACETATE 150 MG/ML IM SUSP
150.0000 mg | Freq: Once | INTRAMUSCULAR | Status: AC
  Administered 2019-03-18: 16:00:00 150 mg via INTRAMUSCULAR

## 2019-03-18 NOTE — Telephone Encounter (Signed)
Spoke with mother about new address and procedures. She verbalized understanding.

## 2019-03-18 NOTE — Progress Notes (Addendum)
Kerri Harris here for Depo-Provera  Injection.  Injection administered without complication. Patient will return in 3 months for next injection.  Ralene Bathe, RN 03/18/2019  4:19 PM  I have reviewed the documentation and I agree with the plan of care.  Nolene Bernheim, RN, MSN, NP-BC Nurse Practitioner, [facility] for [employer], [facility] 03/19/2019 8:26 AM

## 2019-06-02 ENCOUNTER — Telehealth: Payer: Self-pay | Admitting: Family Medicine

## 2019-06-02 NOTE — Telephone Encounter (Signed)
Spoke with patient's mother about her appointment on 6/30 @ 4:20. Mother was instructed that they have to wear a face mask. They were instructed that no visitors are allowed. They were screened for covid symptoms and denied having any.

## 2019-06-03 ENCOUNTER — Other Ambulatory Visit: Payer: Self-pay

## 2019-06-03 ENCOUNTER — Ambulatory Visit (INDEPENDENT_AMBULATORY_CARE_PROVIDER_SITE_OTHER): Payer: Managed Care, Other (non HMO)

## 2019-06-03 DIAGNOSIS — Z3042 Encounter for surveillance of injectable contraceptive: Secondary | ICD-10-CM

## 2019-06-03 MED ORDER — MEDROXYPROGESTERONE ACETATE 150 MG/ML IM SUSP
150.0000 mg | Freq: Once | INTRAMUSCULAR | Status: AC
  Administered 2019-06-03: 150 mg via INTRAMUSCULAR

## 2019-06-03 NOTE — Progress Notes (Signed)
I have reviewed the chart and agree with nursing staff's documentation of this patient's encounter.  Timothey Dahlstrom, CNM 06/03/2019 5:11 PM    

## 2019-06-03 NOTE — Progress Notes (Signed)
Kerri Harris here for Depo-Provera  Injection.  Injection administered without complication. Patient will return in 3 months for next injection.  Verdell Carmine, RN 06/03/2019  4:07 PM

## 2019-08-18 ENCOUNTER — Telehealth: Payer: Self-pay | Admitting: Obstetrics and Gynecology

## 2019-08-18 NOTE — Telephone Encounter (Signed)
Called the patient to confirm of the upcoming appointment. The patient's mother answered the phone stating I can talk to her. I requested the mother's first and last name to verify if she was listed on the demographics. She was extremely rude stating she knows when the appointment is and there is no reason for [place] to still be on the phone. Informed Kerri Harris of the covid screening questions and she stated no she doesn't have covid, no she hasn't been anywhere, and no she doesn't have any symptoms. You don't need to ask me any questions.

## 2019-08-19 ENCOUNTER — Ambulatory Visit: Payer: Managed Care, Other (non HMO)

## 2019-08-22 ENCOUNTER — Other Ambulatory Visit (INDEPENDENT_AMBULATORY_CARE_PROVIDER_SITE_OTHER): Payer: Managed Care, Other (non HMO)

## 2019-08-22 ENCOUNTER — Ambulatory Visit (INDEPENDENT_AMBULATORY_CARE_PROVIDER_SITE_OTHER): Payer: Managed Care, Other (non HMO) | Admitting: Family

## 2019-08-22 ENCOUNTER — Other Ambulatory Visit: Payer: Self-pay

## 2019-08-22 ENCOUNTER — Encounter: Payer: Self-pay | Admitting: Family

## 2019-08-22 VITALS — BP 116/80 | HR 106 | Temp 99.7°F | Ht 59.0 in

## 2019-08-22 DIAGNOSIS — L03115 Cellulitis of right lower limb: Secondary | ICD-10-CM

## 2019-08-22 LAB — COMPREHENSIVE METABOLIC PANEL
ALT: 10 U/L (ref 0–35)
AST: 13 U/L (ref 0–37)
Albumin: 4.8 g/dL (ref 3.5–5.2)
Alkaline Phosphatase: 60 U/L (ref 39–117)
BUN: 13 mg/dL (ref 6–23)
CO2: 23 mEq/L (ref 19–32)
Calcium: 10.3 mg/dL (ref 8.4–10.5)
Chloride: 106 mEq/L (ref 96–112)
Creatinine, Ser: 0.56 mg/dL (ref 0.40–1.20)
GFR: 154.56 mL/min (ref >60.00)
Glucose, Bld: 110 mg/dL — ABNORMAL HIGH (ref 70–99)
Potassium: 3.4 mEq/L — ABNORMAL LOW (ref 3.5–5.1)
Sodium: 140 mEq/L (ref 135–145)
Total Bilirubin: 1.4 mg/dL — ABNORMAL HIGH (ref 0.2–1.2)
Total Protein: 7.8 g/dL (ref 6.0–8.3)

## 2019-08-22 LAB — CBC WITH DIFFERENTIAL/PLATELET
Basophils Absolute: 0.1 10*3/uL (ref 0.0–0.1)
Basophils Relative: 0.4 % (ref 0.0–3.0)
Eosinophils Absolute: 0 10*3/uL (ref 0.0–0.7)
Eosinophils Relative: 0.2 % (ref 0.0–5.0)
HCT: 38.7 % (ref 36.0–46.0)
Hemoglobin: 12.8 g/dL (ref 12.0–15.0)
Lymphocytes Relative: 8.9 % — ABNORMAL LOW (ref 12.0–46.0)
Lymphs Abs: 1.2 10*3/uL (ref 0.7–4.0)
MCHC: 33.1 g/dL (ref 30.0–36.0)
MCV: 84.2 fl (ref 78.0–100.0)
Monocytes Absolute: 1.3 10*3/uL — ABNORMAL HIGH (ref 0.1–1.0)
Monocytes Relative: 9.7 % (ref 3.0–12.0)
Neutro Abs: 10.8 10*3/uL — ABNORMAL HIGH (ref 1.4–7.7)
Neutrophils Relative %: 80.8 % — ABNORMAL HIGH (ref 43.0–77.0)
Platelets: 275 10*3/uL (ref 150.0–400.0)
RBC: 4.6 Mil/uL (ref 3.87–5.11)
RDW: 12.3 % (ref 11.5–15.5)
WBC: 13.4 10*3/uL — ABNORMAL HIGH (ref 4.0–10.5)

## 2019-08-22 MED ORDER — CEPHALEXIN 500 MG PO CAPS: 500.0000 mg | ORAL_CAPSULE | Freq: Three times a day (TID) | ORAL | 0 refills | Status: DC

## 2019-08-22 MED ORDER — CEFTRIAXONE SODIUM 500 MG IJ SOLR
500.0000 mg | Freq: Once | INTRAMUSCULAR | Status: AC
  Administered 2019-08-22: 500 mg via INTRAMUSCULAR

## 2019-08-22 NOTE — Progress Notes (Signed)
Kerri Harris is a 28 y.o. female with the following history as recorded in EpicCare:  Patient Active Problem List   Diagnosis Date Noted  . Atopic dermatitis 11/14/2017  . Hyperglycemia 11/14/2017  . Encounter for general adult medical examination with abnormal findings 08/07/2017  . Cerebral palsy (HCC) 07/20/2016  . Cough 07/08/2015  . Outbursts of anger 06/11/2015  . Needs assistance while at home 06/11/2015    Current Outpatient Medications  Medication Sig Dispense Refill  . medroxyPROGESTERone (DEPO-PROVERA) 150 MG/ML injection Inject 150 mg into the muscle See admin instructions. Inject 150 mg IM every 2 months    . cephALEXin (KEFLEX) 500 MG capsule Take 1 capsule (500 mg total) by mouth 3 (three) times daily. 21 capsule 0   No current facility-administered medications for this visit.     Allergies: Patient has no known allergies.  Past Medical History:  Diagnosis Date  . Cerebral palsy (HCC)   . Eczema    comes and goes per mother   . Seizures (HCC)    Childhood seizures - not since age 7    Past Surgical History:  Procedure Laterality Date  . feet surgery Bilateral   . HEMORRHOID SURGERY N/A 01/22/2018   Procedure: HEMORRHOIDECTOMY SINGLE COLUMN;  Surgeon: Thomas, Alicia, MD;  Location: [facility];  Service: General;  Laterality: N/A;  . HIP SURGERY Bilateral   . KNEE SURGERY Bilateral     Family History  Problem Relation Age of Onset  . Arthritis Mother   . Hypertension Maternal Grandmother   . Diabetes Maternal Grandmother     Social History   Tobacco Use  . Smoking status: Never Smoker  . Smokeless tobacco: Never Used  Substance Use Topics  . Alcohol use: No    Alcohol/week: 0.0 standard drinks    Subjective:  Accompanied by her mother; started this morning with redness/ pain/ swelling in right foot; no known injury or trauma- thinks she may have remembered some type of insect bite; no changes in appetite today- has been able to eat,drink normally; does  not feel that she has run fever- used Tylenol earlier today to help with pain; painful to try and bear weight on the right foot; mother is very adamant that they do not want to go to the ER if at all possible.     Objective:  Vitals:   08/22/19 1437 08/22/19 1712  BP: 116/80   Pulse: (!) 120 (!) 106  Temp: 99.7 F (37.6 C)   TempSrc: Oral   SpO2: 97%   Height: 4' 11" (1.499 m)     General: Well developed, well nourished, in no acute distress  Skin : Warm and dry.  Head: Normocephalic and atraumatic  Lungs: Respirations unlabored; clear to auscultation bilaterally without wheeze, rales, rhonchi  CVS exam: normal rate and regular rhythm.  Musculoskeletal: No deformities; right foot is swollen, erythematous; no swelling or streaking noted on lower leg  Extremities +edema of lateral right foot, no cyanosis Vessels: Symmetric bilaterally  Neurologic: Alert and oriented; speech intact; face symmetrical;   Assessment:  1. Cellulitis of right lower extremity     Plan:  STAT WBC is at 13.4; will give Rocephin IM 500 mg in office; start oral Keflex tomorrow; explained to mother that am still concerned patient might need IV antibiotics and if no improvement by tomorrow, she would need to go to ER; mother is comfortable with this plan and understands strict ER precautions.   No follow-ups on file.  Orders Placed   This Encounter  Procedures  . CBC w/Diff    Standing Status:   Future    Number of Occurrences:   1    Standing Expiration Date:   08/21/2020  . Comp Met (CMET)    Standing Status:   Future    Number of Occurrences:   1    Standing Expiration Date:   08/21/2020    Requested Prescriptions   Signed Prescriptions Disp Refills  . cephALEXin (KEFLEX) 500 MG capsule 21 capsule 0    Sig: Take 1 capsule (500 mg total) by mouth 3 (three) times daily.     

## 2019-08-25 ENCOUNTER — Ambulatory Visit (INDEPENDENT_AMBULATORY_CARE_PROVIDER_SITE_OTHER): Payer: Managed Care, Other (non HMO)

## 2019-08-25 ENCOUNTER — Other Ambulatory Visit: Payer: Self-pay

## 2019-08-25 ENCOUNTER — Telehealth: Payer: Self-pay

## 2019-08-25 DIAGNOSIS — Z3042 Encounter for surveillance of injectable contraceptive: Secondary | ICD-10-CM

## 2019-08-25 MED ORDER — MEDROXYPROGESTERONE ACETATE 150 MG/ML IM SUSP
150.0000 mg | Freq: Once | INTRAMUSCULAR | Status: AC
  Administered 2019-08-25: 150 mg via INTRAMUSCULAR

## 2019-08-25 NOTE — Telephone Encounter (Signed)
Spoke with mother today.

## 2019-08-25 NOTE — Telephone Encounter (Signed)
Please advise if you can take pt?

## 2019-08-25 NOTE — Progress Notes (Signed)
Kerri Harris here for Depo-Provera  Injection.  Injection administered without complication. Patient will return in 3 months for next injection.  Verdell Carmine, RN 08/25/2019  5:14 PM

## 2019-08-26 NOTE — Telephone Encounter (Signed)
Can you call pt to schedule new patient appointment?

## 2019-08-26 NOTE — Telephone Encounter (Signed)
Yes, I will see her 

## 2019-08-27 NOTE — Telephone Encounter (Signed)
Appointment scheduled.

## 2019-09-02 ENCOUNTER — Other Ambulatory Visit (INDEPENDENT_AMBULATORY_CARE_PROVIDER_SITE_OTHER): Payer: Managed Care, Other (non HMO)

## 2019-09-02 ENCOUNTER — Encounter: Payer: Self-pay | Admitting: Internal Medicine

## 2019-09-02 ENCOUNTER — Other Ambulatory Visit: Payer: Self-pay

## 2019-09-02 ENCOUNTER — Other Ambulatory Visit: Payer: Self-pay | Admitting: Internal Medicine

## 2019-09-02 ENCOUNTER — Ambulatory Visit (INDEPENDENT_AMBULATORY_CARE_PROVIDER_SITE_OTHER)
Admission: RE | Admit: 2019-09-02 | Discharge: 2019-09-02 | Disposition: A | Discharge status: Home / Self Care | Payer: Managed Care, Other (non HMO) | Source: Ambulatory Visit | Attending: Internal Medicine | Admitting: Internal Medicine

## 2019-09-02 ENCOUNTER — Ambulatory Visit (INDEPENDENT_AMBULATORY_CARE_PROVIDER_SITE_OTHER): Payer: Managed Care, Other (non HMO) | Admitting: Internal Medicine

## 2019-09-02 VITALS — BP 130/64 | HR 80 | Temp 98.3°F | Resp 16 | Ht 59.0 in | Wt 111.0 lb

## 2019-09-02 DIAGNOSIS — M79671 Pain in right foot: Secondary | ICD-10-CM

## 2019-09-02 DIAGNOSIS — M79672 Pain in left foot: Secondary | ICD-10-CM

## 2019-09-02 DIAGNOSIS — Z23 Encounter for immunization: Secondary | ICD-10-CM

## 2019-09-02 DIAGNOSIS — M7989 Other specified soft tissue disorders: Secondary | ICD-10-CM | POA: Insufficient documentation

## 2019-09-02 LAB — BASIC METABOLIC PANEL
BUN: 15 mg/dL (ref 6–23)
CO2: 23 mEq/L (ref 19–32)
Calcium: 10 mg/dL (ref 8.4–10.5)
Chloride: 105 mEq/L (ref 96–112)
Creatinine, Ser: 0.74 mg/dL (ref 0.40–1.20)
GFR: 112.02 mL/min (ref >60.00)
Glucose, Bld: 95 mg/dL (ref 70–99)
Potassium: 3.6 mEq/L (ref 3.5–5.1)
Sodium: 139 mEq/L (ref 135–145)

## 2019-09-02 LAB — CBC WITH DIFFERENTIAL/PLATELET
Basophils Absolute: 0 10*3/uL (ref 0.0–0.1)
Basophils Relative: 0.4 % (ref 0.0–3.0)
Eosinophils Absolute: 0.1 10*3/uL (ref 0.0–0.7)
Eosinophils Relative: 1.7 % (ref 0.0–5.0)
HCT: 40.1 % (ref 36.0–46.0)
Hemoglobin: 13.2 g/dL (ref 12.0–15.0)
Lymphocytes Relative: 26.7 % (ref 12.0–46.0)
Lymphs Abs: 2.2 10*3/uL (ref 0.7–4.0)
MCHC: 33 g/dL (ref 30.0–36.0)
MCV: 84.4 fl (ref 78.0–100.0)
Monocytes Absolute: 0.7 10*3/uL (ref 0.1–1.0)
Monocytes Relative: 8 % (ref 3.0–12.0)
Neutro Abs: 5.2 10*3/uL (ref 1.4–7.7)
Neutrophils Relative %: 63.2 % (ref 43.0–77.0)
Platelets: 345 10*3/uL (ref 150.0–400.0)
RBC: 4.75 Mil/uL (ref 3.87–5.11)
RDW: 12.7 % (ref 11.5–15.5)
WBC: 8.2 10*3/uL (ref 4.0–10.5)

## 2019-09-02 LAB — URIC ACID: Uric Acid, Serum: 4.5 mg/dL (ref 2.4–7.0)

## 2019-09-02 LAB — CK: Total CK: 118 U/L (ref 7–177)

## 2019-09-02 LAB — C-REACTIVE PROTEIN: CRP: <1 mg/dL (ref 0.5–20.0)

## 2019-09-02 MED ORDER — METHYLPREDNISOLONE 4 MG PO TBPK: ORAL_TABLET | ORAL | 0 refills | Status: AC

## 2019-09-02 NOTE — Patient Instructions (Signed)

## 2019-09-02 NOTE — Progress Notes (Signed)
Subjective:  Patient ID: Kerri Harris, female    DOB: September 29, 1991  Age: 28 y.o. MRN: [id number]  CC: Foot Pain  NEW TO ME  HPI Kerri Harris presents for concerns about her feet.  She has cerebral palsy and complains that over the last 2 to 3 weeks she has developed pain in her feet with weightbearing activity.  She could previously weight-bear with crutches but is now wheelchair-bound.  There is also been some redness and swelling.  She saw someone else about 10 days ago and took a course of Keflex which did not help much.  She says none of her other joints bother her.  The symptoms are isolated to the feet.  She denies chest pain, shortness of breath, abdominal pain, fever, chills, dysuria, or hematuria.  Outpatient Medications Prior to Visit  Medication Sig Dispense Refill  . medroxyPROGESTERone (DEPO-PROVERA) 150 MG/ML injection Inject 150 mg into the muscle See admin instructions. Inject 150 mg IM every 2 months    . cephALEXin (KEFLEX) 500 MG capsule Take 1 capsule (500 mg total) by mouth 3 (three) times daily. 21 capsule 0   No facility-administered medications prior to visit.     ROS Review of Systems  Constitutional: Negative for chills, diaphoresis, fatigue and fever.  HENT: Negative.   Eyes: Negative.   Respiratory: Negative for cough, chest tightness, shortness of breath and wheezing.   Cardiovascular: Negative for chest pain, palpitations and leg swelling.  Gastrointestinal: Negative for abdominal pain, constipation, diarrhea, nausea and vomiting.  Endocrine: Negative.   Genitourinary: Negative for difficulty urinating, dysuria, hematuria and urgency.  Musculoskeletal: Positive for arthralgias and gait problem. Negative for myalgias.  Skin: Positive for color change. Negative for rash.  Neurological: Negative for dizziness, weakness, light-headedness, numbness and headaches.  Hematological: Negative for adenopathy. Does not bruise/bleed easily.  Psychiatric/Behavioral:  Negative.     Objective:  BP 130/64 (BP Location: Left Arm, Patient Position: Sitting, Cuff Size: Normal)   Pulse 80   Temp 98.3 F (36.8 C) (Oral)   Resp 16   Ht 4\' 11"  (1.499 m)   Wt 111 lb (50.3 kg)   LMP 08/03/2019 (Within Weeks)   SpO2 99%   BMI 22.42 kg/m   BP Readings from Last 3 Encounters:  09/02/19 130/64  08/22/19 116/80  12/30/18 115/62    Wt Readings from Last 3 Encounters:  09/02/19 111 lb (50.3 kg)  11/07/18 100 lb (45.4 kg)  09/02/18 100 lb (45.4 kg)    Physical Exam Vitals signs reviewed.  Constitutional:      General: She is not in acute distress.    Appearance: She is ill-appearing. She is not toxic-appearing or diaphoretic.  HENT:     Nose: Nose normal.     Mouth/Throat:     Mouth: Mucous membranes are moist.  Eyes:     General: No scleral icterus.    Conjunctiva/sclera: Conjunctivae normal.  Neck:     Musculoskeletal: Normal range of motion and neck supple.  Cardiovascular:     Rate and Rhythm: Normal rate and regular rhythm.     Heart sounds: No murmur.  Pulmonary:     Effort: Pulmonary effort is normal.     Breath sounds: No stridor. No wheezing, rhonchi or rales.  Abdominal:     General: Abdomen is flat. Bowel sounds are normal. There is no distension.     Palpations: There is no hepatomegaly or splenomegaly.     Tenderness: There is no abdominal tenderness.  Musculoskeletal:  Comments: Over the dorsum of both feet, symmetrically there is diffuse swelling, warmth, and tenderness.  This ends abruptly at the ankles and on the sides of the feet.  Distally in the toes there is good sensation and capillary refill.  There is no streaking, induration, fluctuance, or epidermal lesions.  Lymphadenopathy:     Cervical: No cervical adenopathy.  Skin:    General: Skin is warm.     Findings: No rash.  Neurological:     General: No focal deficit present.     Mental Status: She is alert.     Lab Results  Component Value Date   WBC 8.2  09/02/2019   HGB 13.2 09/02/2019   HCT 40.1 09/02/2019   PLT 345.0 09/02/2019   GLUCOSE 95 09/02/2019   CHOL 139 08/07/2017   TRIG 148.0 08/07/2017   HDL 49.00 08/07/2017   LDLCALC 61 08/07/2017   ALT 10 08/22/2019   AST 13 08/22/2019   NA 139 09/02/2019   K 3.6 09/02/2019   CL 105 09/02/2019   CREATININE 0.74 09/02/2019   BUN 15 09/02/2019   CO2 23 09/02/2019   TSH 1.69 06/11/2015   HGBA1C 5.3 11/14/2017    Dg Foot 2 Views Left  Result Date: 09/03/2019 CLINICAL DATA:  Left foot pain and swelling, history of cerebral palsy EXAM: LEFT FOOT - 2 VIEW COMPARISON:  None. FINDINGS: Suboptimal projections were obtained due to difficulty with positioning. Pes planus deformity of the foot incompletely assessed on non standard views. No acute fracture or traumatic malalignment. Diffuse soft tissue swelling is present of the left lower extremity most pronounced over the forefoot. No visible erosion, soft tissue gas or foreign body. No osseous erosion or periostitis to suggest early radiographic features of osteomyelitis. IMPRESSION: 1. Diffuse soft tissue swelling most pronounced about the forefoot. No radiographic evidence of osteomyelitis. 2. Pes planus deformity of the foot incompletely assessed on non standard views. Electronically Signed   By: Kreg ShropshirePrice  DeHay M.D.   On: 09/03/2019 00:32   Dg Foot 2 Views Right  Result Date: 09/03/2019 CLINICAL DATA:  Bilateral foot pain and swelling for 2 weeks, history of cerebral palsy EXAM: RIGHT FOOT - 2 VIEW COMPARISON:  None. FINDINGS: Rocker bottom deformity of the foot compatible with history of cerebral palsy. No acute fracture or traumatic malalignment. Diffuse soft tissue swelling is present of the right lower extremity most pronounced over the forefoot and plantar aspect subjacent to the calcaneus. No visible erosion, soft tissue gas or foreign body. No osseous erosion or periostitis to suggest early radiographic features of osteomyelitis. IMPRESSION:  1. Diffuse soft tissue swelling of the right lower extremity without radiographic evidence of osteomyelitis. 2. Rocker bottom deformity of the foot compatible with history of cerebral palsy. Electronically Signed   By: Kreg ShropshirePrice  DeHay M.D.   On: 09/03/2019 00:30    Assessment & Plan:   Morrie Sheldonshley was seen today for foot pain.  Diagnoses and all orders for this visit:  Need for influenza vaccination -     Flu Vaccine QUAD 36+ mos IM  Bilateral swelling of feet- Based on the symptoms, exam, and x-rays she has inflammation in the soft tissues on the dorsum of both feet.  She has bony deformities expected for cerebral palsy.  I will screen her for inflammatory process, gout, connective tissue disease, and myopathy.  For now I recommended that she take a 6-day course of methylprednisolone and to elevate her feet as much as possible. -  Cancel: DG Foot Complete Right; Future -     Cancel: DG Foot Complete Left; Future -     Basic metabolic panel; Future -     C-reactive protein; Future -     Rheumatoid factor; Future -     Cyclic citrul peptide antibody, IgG; Future -     ANA; Future -     CBC with Differential/Platelet; Future -     CK; Future -     methylPREDNISolone (MEDROL DOSEPAK) 4 MG TBPK tablet; TAKE AS DIRECTED -     Uric acid; Future  Foot pain, bilateral- See above -     Cancel: DG Foot Complete Right; Future -     Cancel: DG Foot Complete Left; Future -     Basic metabolic panel; Future -     C-reactive protein; Future -     Rheumatoid factor; Future -     Cyclic citrul peptide antibody, IgG; Future -     ANA; Future -     CBC with Differential/Platelet; Future -     CK; Future -     methylPREDNISolone (MEDROL DOSEPAK) 4 MG TBPK tablet; TAKE AS DIRECTED -     Uric acid; Future   I have discontinued Kinzee Howse's cephALEXin. I am also having her start on methylPREDNISolone. Additionally, I am having her maintain her medroxyPROGESTERone.  Meds ordered this encounter   Medications  . methylPREDNISolone (MEDROL DOSEPAK) 4 MG TBPK tablet    Sig: TAKE AS DIRECTED    Dispense:  21 tablet    Refill:  0     Follow-up: Return in about 3 weeks (around 09/23/2019).  Scarlette Calico, MD

## 2019-09-04 LAB — ANA: Anti Nuclear Antibody (ANA): NEGATIVE

## 2019-09-04 LAB — CYCLIC CITRUL PEPTIDE ANTIBODY, IGG: Cyclic Citrullin Peptide Ab: <16 UNITS

## 2019-09-04 LAB — RHEUMATOID FACTOR: Rhuematoid fact SerPl-aCnc: <14 IU/mL (ref <14)

## 2019-09-08 ENCOUNTER — Other Ambulatory Visit: Payer: Self-pay | Admitting: Internal Medicine

## 2019-09-08 DIAGNOSIS — Q6680 Congenital vertical talus deformity, unspecified foot: Secondary | ICD-10-CM | POA: Insufficient documentation

## 2019-09-16 ENCOUNTER — Other Ambulatory Visit: Payer: Self-pay

## 2019-09-16 DIAGNOSIS — Z20822 Contact with and (suspected) exposure to covid-19: Secondary | ICD-10-CM

## 2019-09-18 ENCOUNTER — Telehealth: Payer: Self-pay | Admitting: Internal Medicine

## 2019-09-18 LAB — NOVEL CORONAVIRUS, NAA: SARS-CoV-2, NAA: NOT DETECTED

## 2019-09-18 NOTE — Telephone Encounter (Signed)
Pt' s mom aware covid lab test negative, not detected 

## 2019-10-02 IMAGING — DX DG FOOT 2V*R*
2 series · 3 of 3 positions shown · non-contrast
Comparison: None.

CLINICAL DATA: Bilateral foot pain and swelling for 2 weeks,
history of cerebral palsy

EXAM:
RIGHT FOOT - 2 VIEW

[Series 1: foot ap · 0.14mm/px · 2 of 2 slices shown]
[im 1/2]
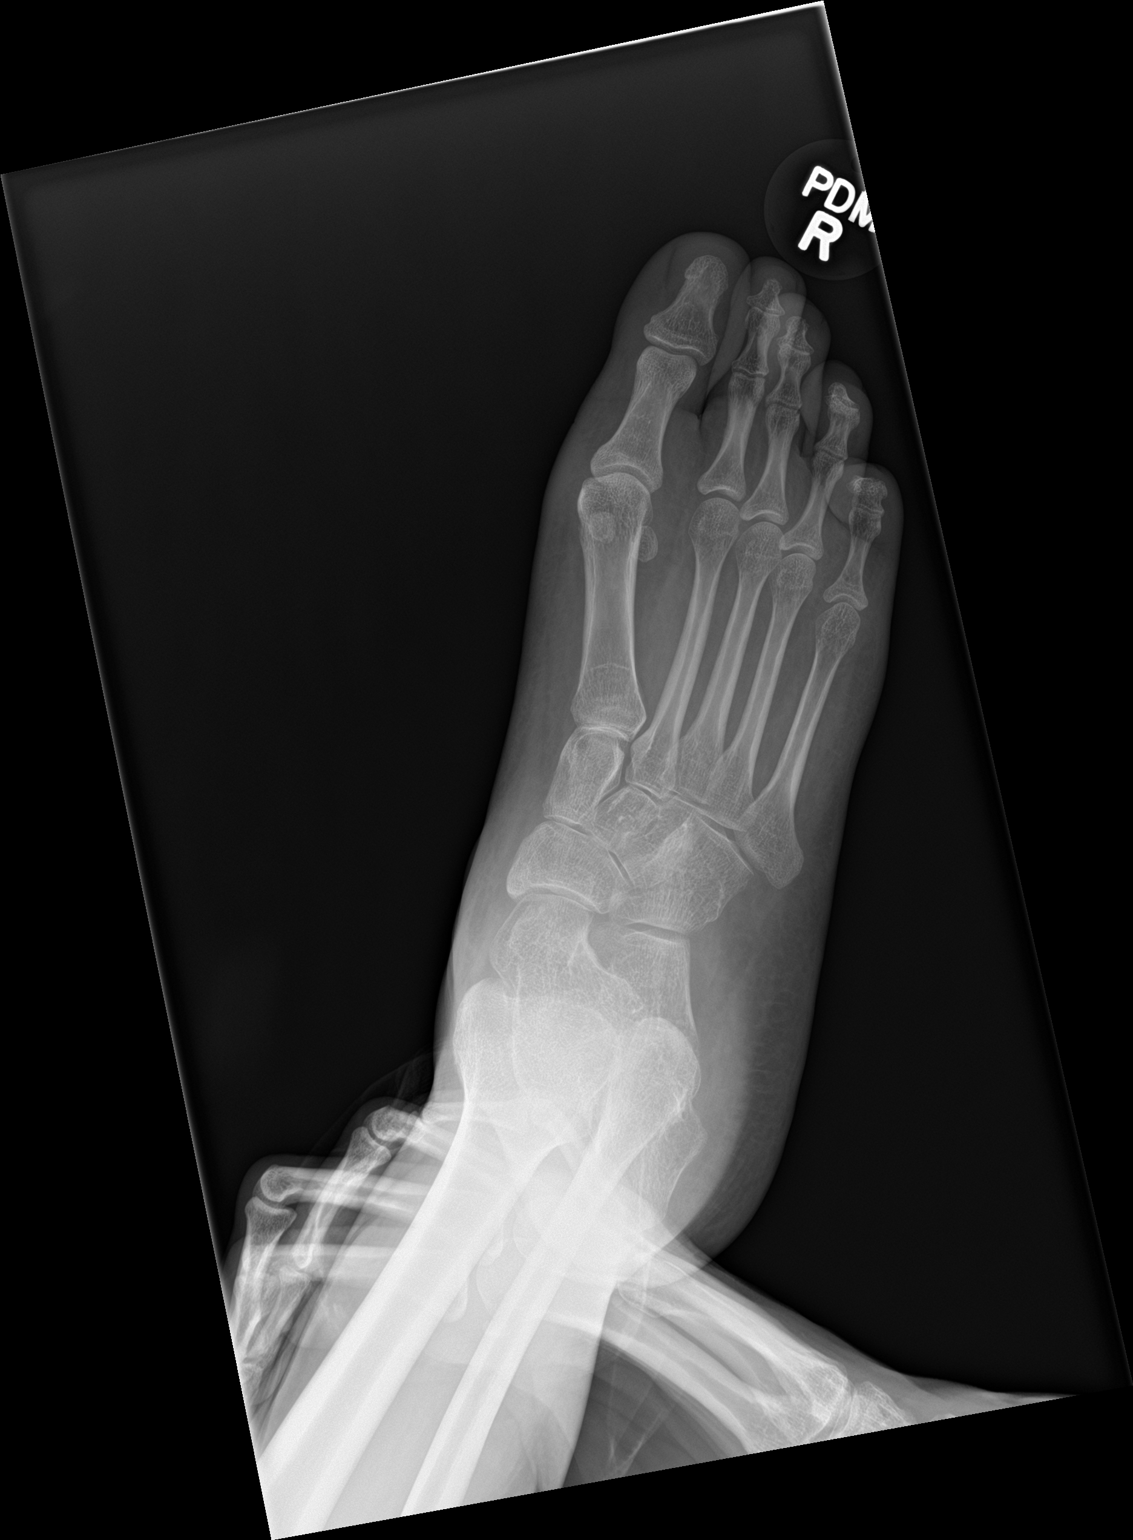
[im 2/2]
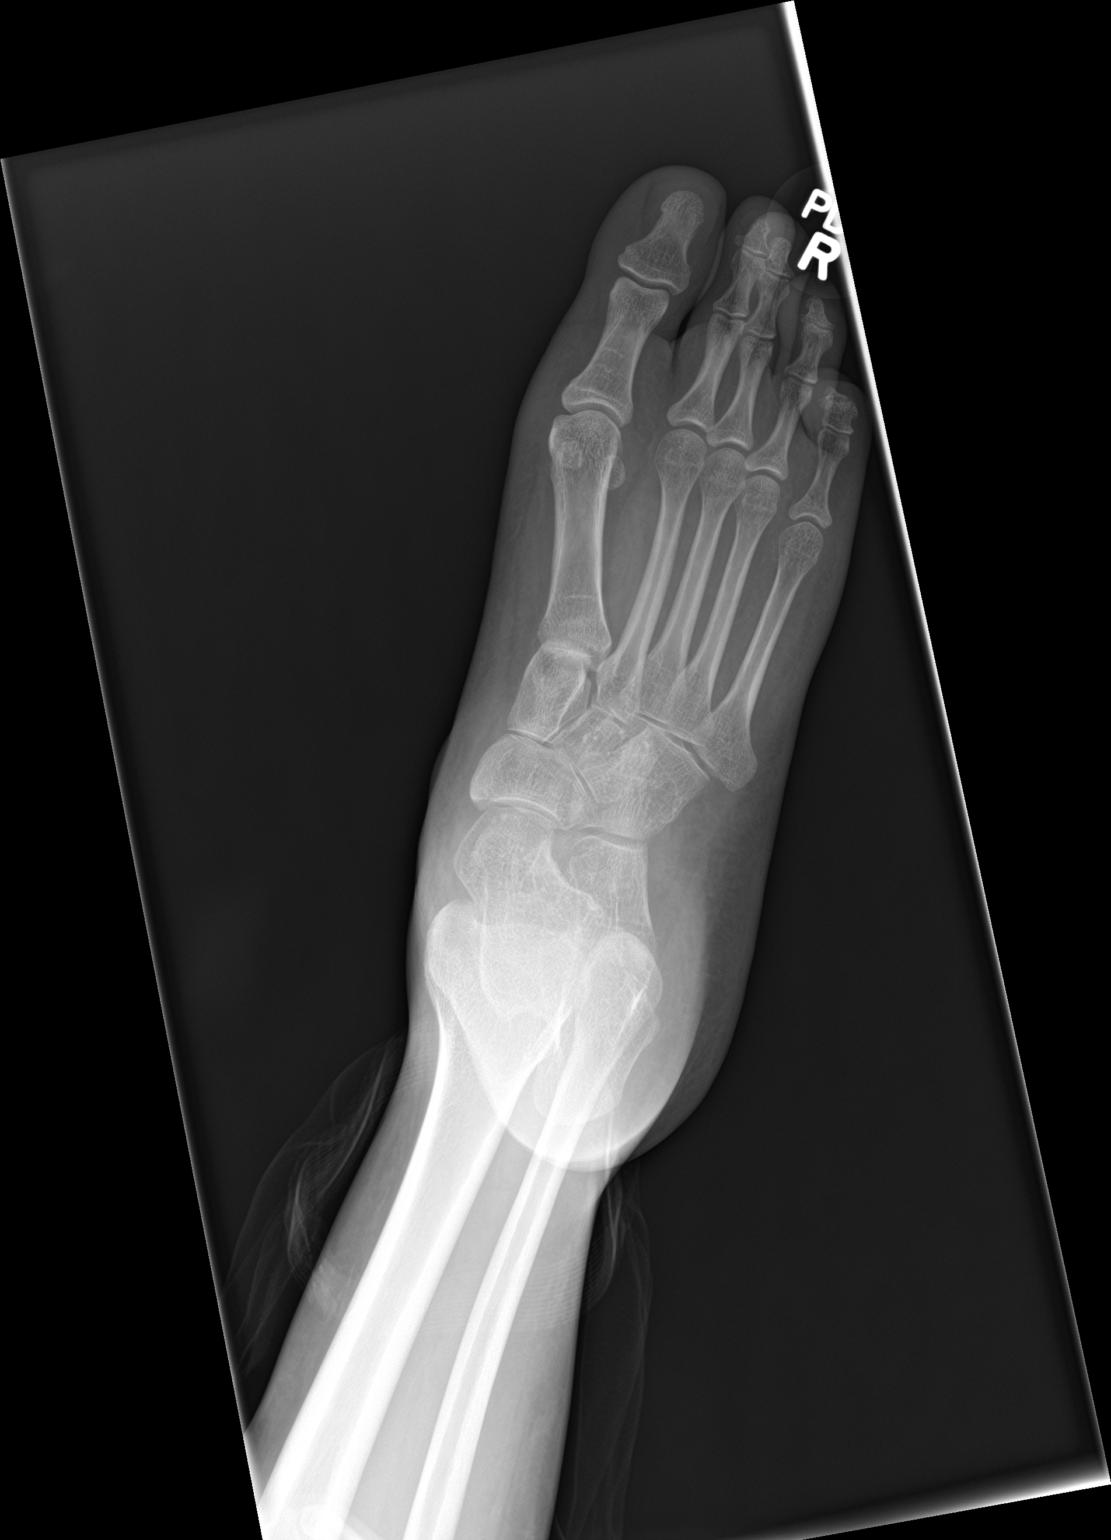

[foot lat]
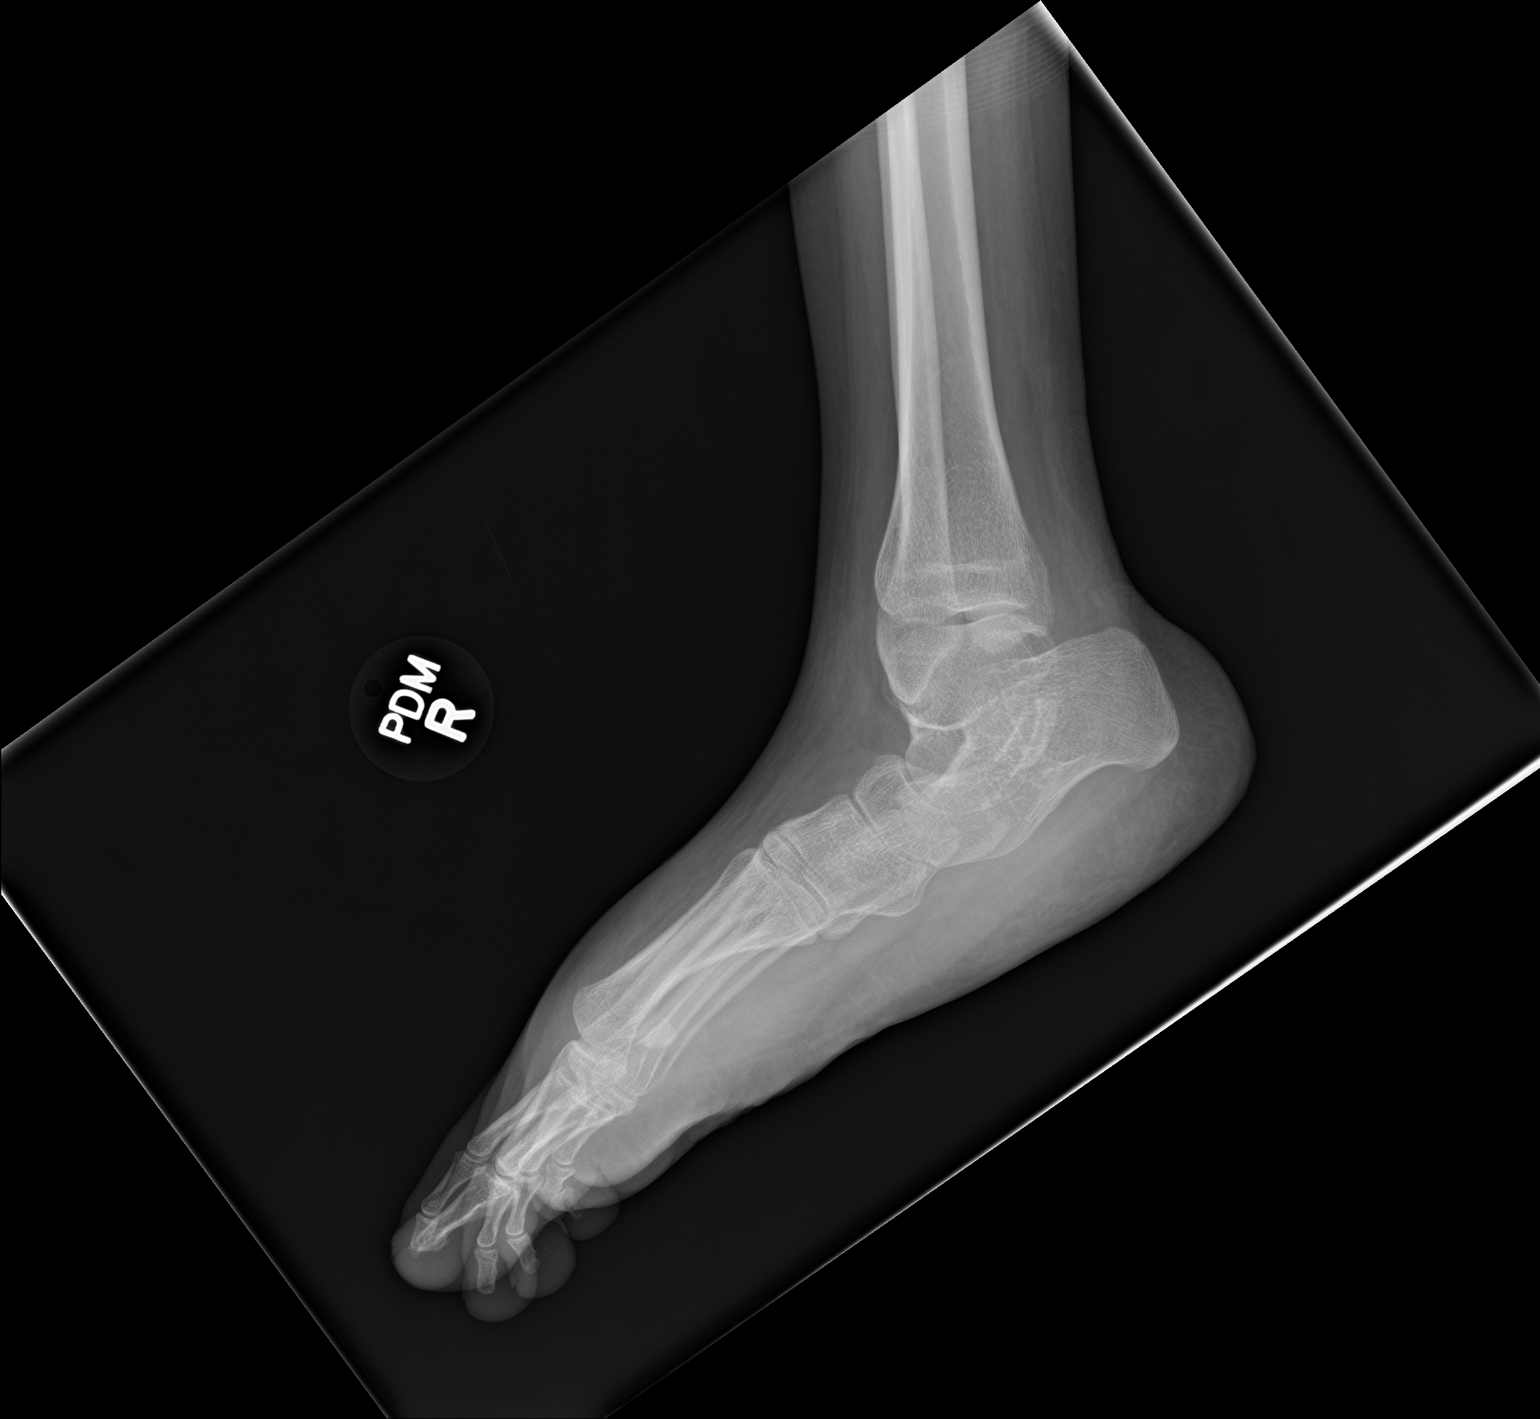

[3 of 3 positions shown; findings below may reference images not displayed]

FINDINGS: Jennica Luu deformity of the foot compatible with history of
cerebral palsy. No acute fracture or traumatic malalignment. Diffuse
soft tissue swelling is present of the right lower extremity most
pronounced over the forefoot and plantar aspect subjacent to the
calcaneus. No visible erosion, soft tissue gas or foreign body. No
osseous erosion or periostitis to suggest early radiographic
features of osteomyelitis.
IMPRESSION: 1. Diffuse soft tissue swelling of the right lower extremity without
radiographic evidence of osteomyelitis.
2. Jennica Luu deformity of the foot compatible with history of
cerebral palsy.

## 2019-10-02 IMAGING — DX DG FOOT 2V*L*
2 series · 2 of 2 positions shown · non-contrast
Comparison: None.

CLINICAL DATA: Left foot pain and swelling, history of cerebral
palsy

EXAM:
LEFT FOOT - 2 VIEW

[foot ap]
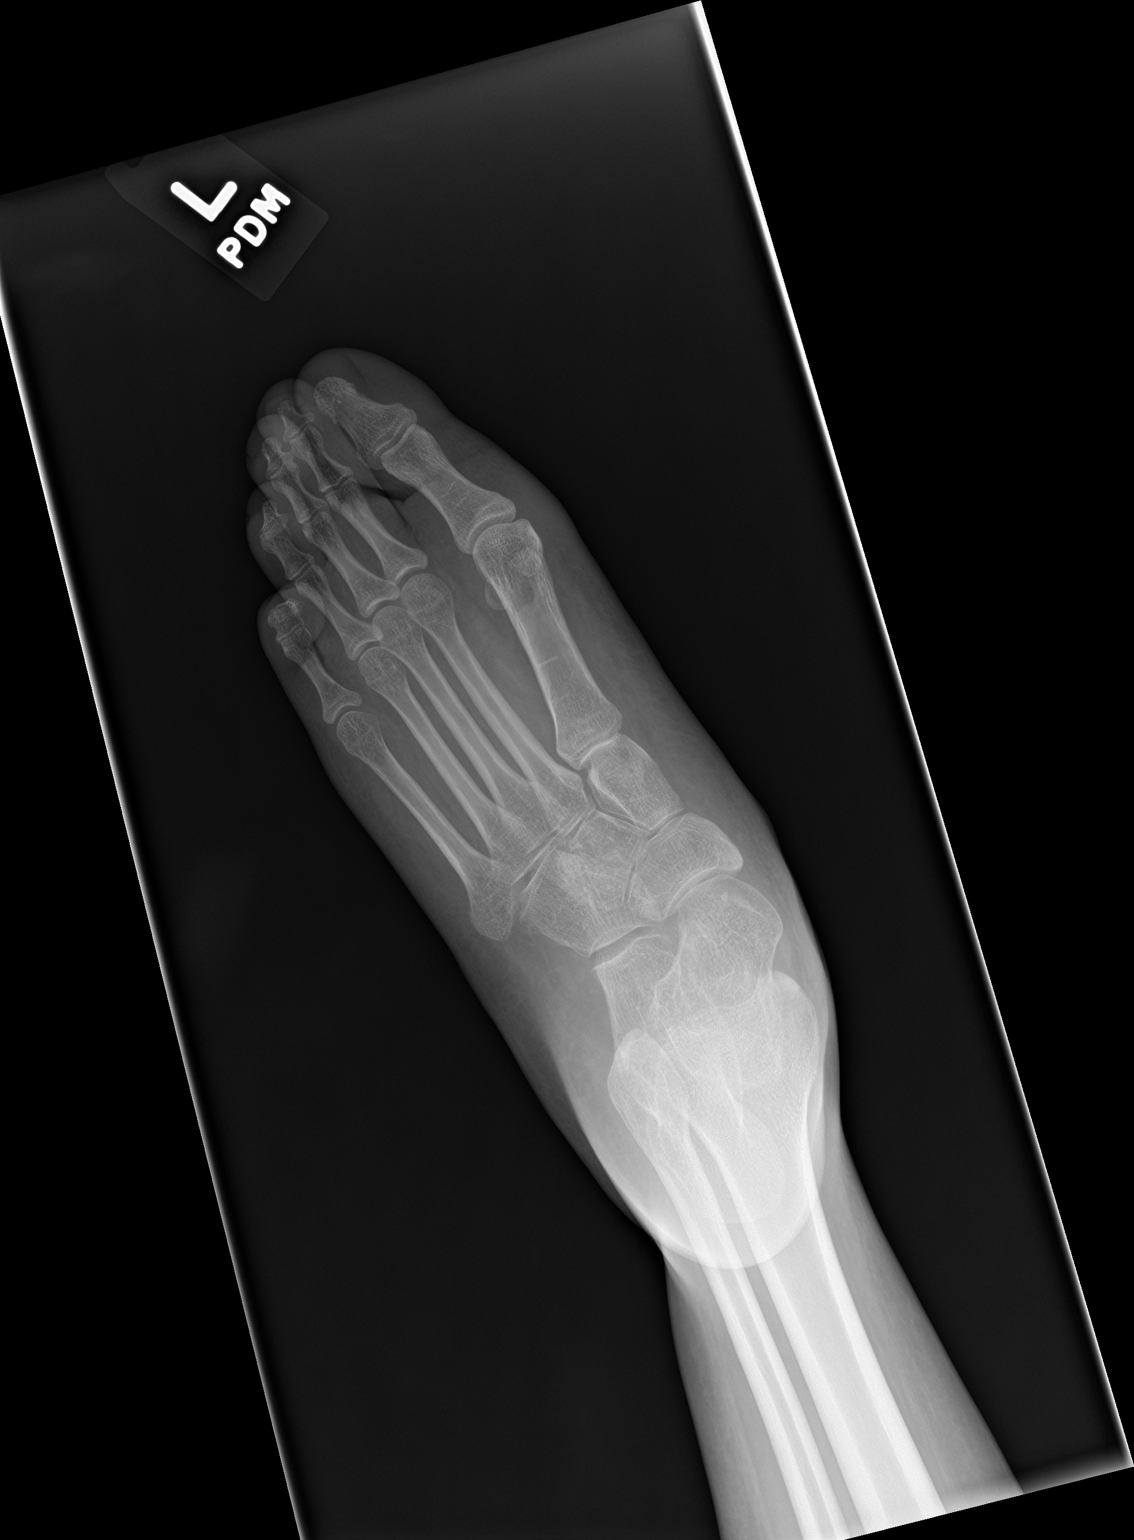

[foot lat]
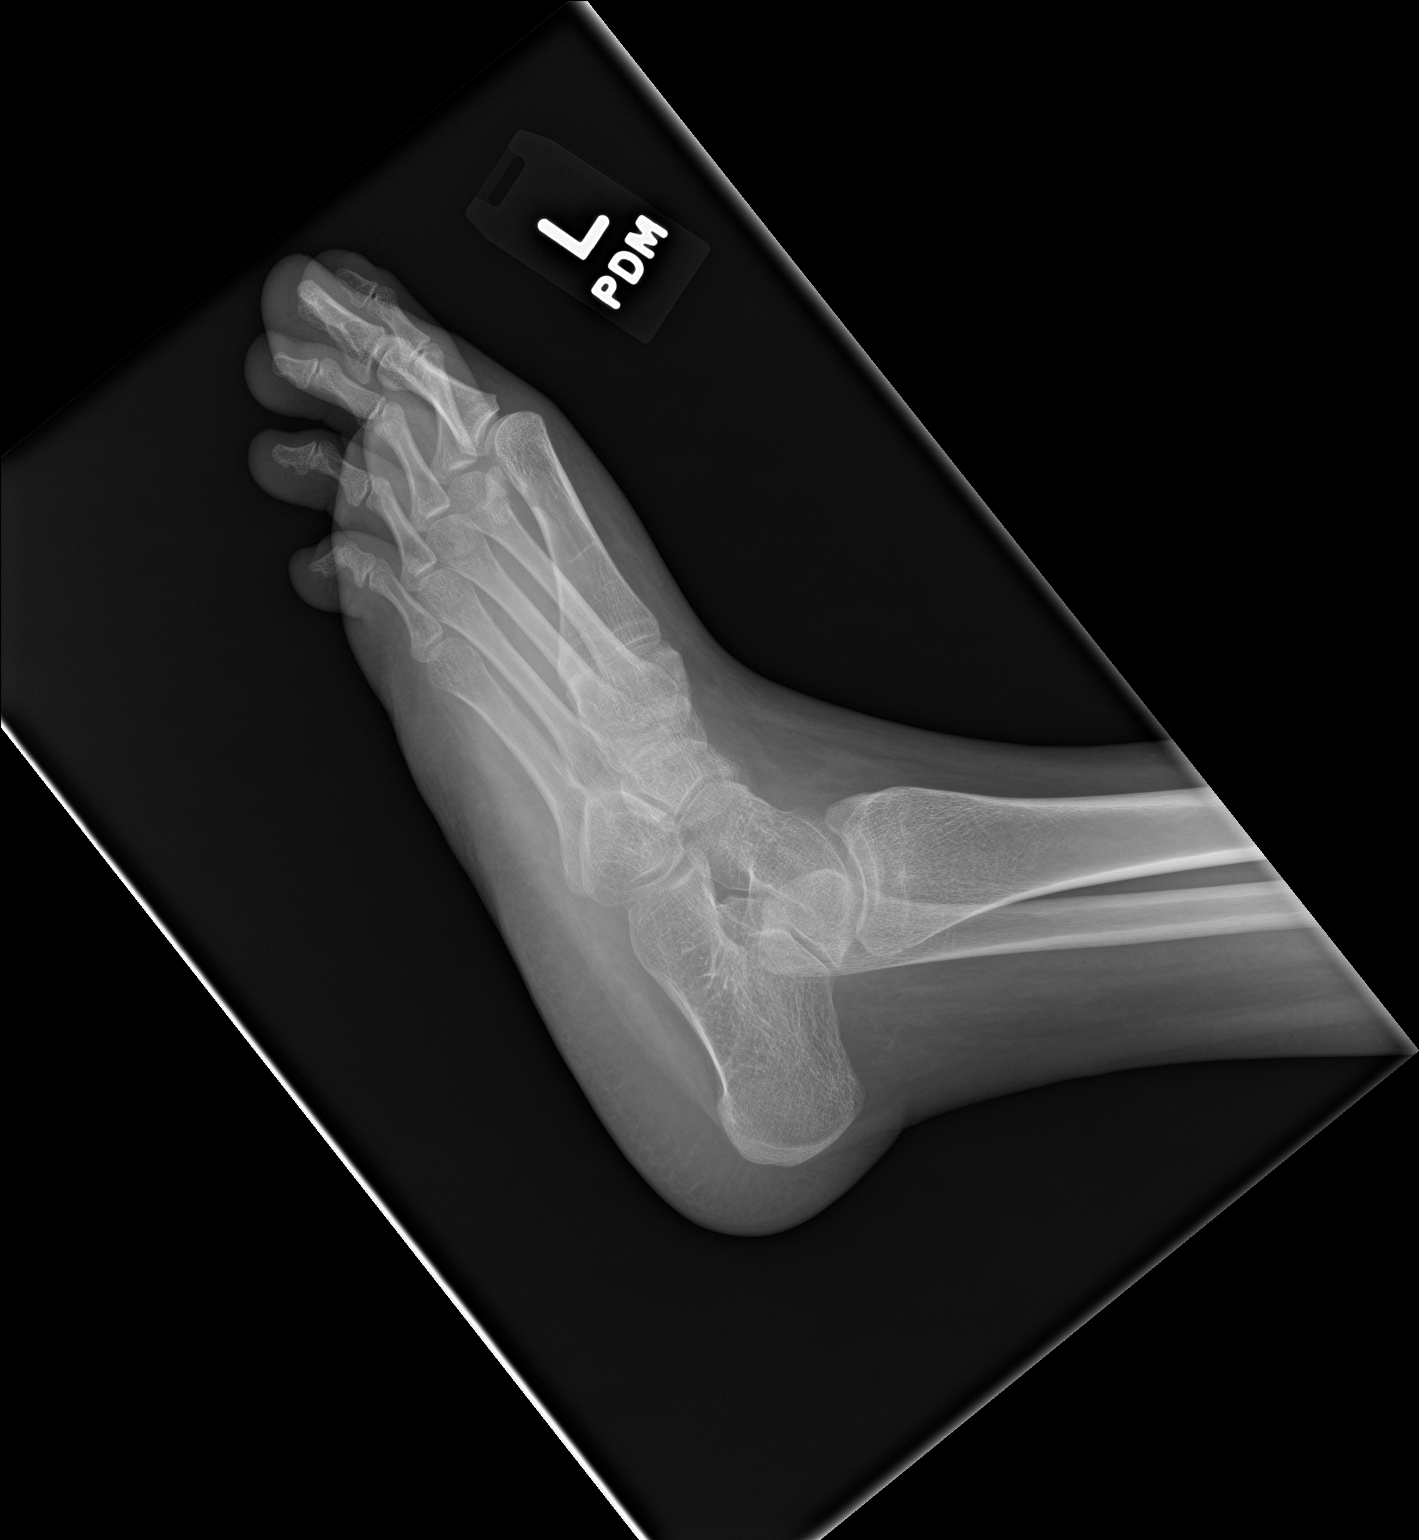

[2 of 2 positions shown; findings below may reference images not displayed]

FINDINGS: Suboptimal projections were obtained due to difficulty with
positioning. Pes planus deformity of the foot incompletely assessed
on non standard views. No acute fracture or traumatic malalignment.
Diffuse soft tissue swelling is present of the left lower extremity
most pronounced over the forefoot. No visible erosion, soft tissue
gas or foreign body. No osseous erosion or periostitis to suggest
early radiographic features of osteomyelitis.
IMPRESSION: 1. Diffuse soft tissue swelling most pronounced about the forefoot.
No radiographic evidence of osteomyelitis.
2. Pes planus deformity of the foot incompletely assessed on non
standard views.

## 2019-10-28 ENCOUNTER — Other Ambulatory Visit: Payer: Self-pay

## 2019-10-28 DIAGNOSIS — Z20822 Contact with and (suspected) exposure to covid-19: Secondary | ICD-10-CM

## 2019-10-30 LAB — NOVEL CORONAVIRUS, NAA: SARS-CoV-2, NAA: NOT DETECTED

## 2019-11-10 ENCOUNTER — Ambulatory Visit: Payer: Managed Care, Other (non HMO)

## 2019-11-17 ENCOUNTER — Ambulatory Visit: Payer: Managed Care, Other (non HMO)

## 2019-11-26 ENCOUNTER — Ambulatory Visit (INDEPENDENT_AMBULATORY_CARE_PROVIDER_SITE_OTHER): Payer: Managed Care, Other (non HMO) | Admitting: Lactation Services

## 2019-11-26 ENCOUNTER — Other Ambulatory Visit: Payer: Self-pay

## 2019-11-26 DIAGNOSIS — Z3042 Encounter for surveillance of injectable contraceptive: Secondary | ICD-10-CM | POA: Diagnosis not present

## 2019-11-26 MED ORDER — MEDROXYPROGESTERONE ACETATE 150 MG/ML IM SUSP
150.0000 mg | Freq: Once | INTRAMUSCULAR | Status: AC
  Administered 2019-11-26: 150 mg via INTRAMUSCULAR

## 2019-11-26 NOTE — Progress Notes (Signed)
Kerri Harris here for Depo-Provera  Injection.  Injection administered without complication. Patient will return in 3 months for next injection. Pt tolerated well. Return between March 10-24.  Pt 13 weeks 2 days post last injection. Pt not able to give urine sample. Pt and mother report pt has not had sexual intercourse. Spoke with Dr. Ilda Basset and ok to go ahead and give injection if not had sex.   Donn Pierini, RN 11/26/2019  8:53 AM

## 2019-12-01 NOTE — Progress Notes (Signed)
Patient seen and assessed by nursing staff during this encounter. I have reviewed the chart and agree with the documentation and plan.  Bradin Mcadory, MD 12/01/2019 9:29 AM   

## 2020-02-13 ENCOUNTER — Ambulatory Visit: Payer: Managed Care, Other (non HMO)

## 2020-02-16 ENCOUNTER — Other Ambulatory Visit: Payer: Self-pay

## 2020-02-16 ENCOUNTER — Ambulatory Visit (INDEPENDENT_AMBULATORY_CARE_PROVIDER_SITE_OTHER): Payer: Managed Care, Other (non HMO) | Admitting: General Practice

## 2020-02-16 VITALS — BP 118/78 | HR 97 | Ht 59.0 in | Wt 100.0 lb

## 2020-02-16 DIAGNOSIS — Z3042 Encounter for surveillance of injectable contraceptive: Secondary | ICD-10-CM | POA: Diagnosis not present

## 2020-02-16 MED ORDER — MEDROXYPROGESTERONE ACETATE 150 MG/ML IM SUSP
150.0000 mg | Freq: Once | INTRAMUSCULAR | Status: AC
  Administered 2020-02-16: 150 mg via INTRAMUSCULAR

## 2020-02-16 NOTE — Progress Notes (Signed)
Chart reviewed for nurse visit. Agree with plan of care.   Venora Maples, MD 02/16/2020 10:18 AM

## 2020-02-16 NOTE — Progress Notes (Signed)
Kerri Harris here for Depo-Provera  Injection.  Injection administered without complication. Patient will return in 3 months for next injection. Patient does need follow up visit with a provider as she hasn't seen one since 2019. Discussed with patient and mother- mother doesn't prefer to bring her here for that as she already sees PCP. Patient's mother will check with PCP for ongoing management.   Marylynn Pearson, RN 02/16/2020  8:50 AM

## 2020-03-29 ENCOUNTER — Ambulatory Visit: Payer: Managed Care, Other (non HMO) | Admitting: Family Medicine

## 2020-03-30 ENCOUNTER — Ambulatory Visit: Payer: Managed Care, Other (non HMO) | Admitting: Nurse Practitioner

## 2020-03-31 ENCOUNTER — Ambulatory Visit: Payer: Managed Care, Other (non HMO) | Admitting: Advanced Practice Midwife

## 2020-03-31 ENCOUNTER — Encounter: Payer: Self-pay | Admitting: Advanced Practice Midwife

## 2020-04-22 ENCOUNTER — Other Ambulatory Visit: Payer: Self-pay

## 2020-04-22 ENCOUNTER — Ambulatory Visit (INDEPENDENT_AMBULATORY_CARE_PROVIDER_SITE_OTHER): Payer: Managed Care, Other (non HMO) | Admitting: Obstetrics and Gynecology

## 2020-04-22 ENCOUNTER — Encounter: Payer: Self-pay | Admitting: Obstetrics and Gynecology

## 2020-04-22 VITALS — BP 132/88 | HR 97 | Ht 59.0 in | Wt 104.0 lb

## 2020-04-22 DIAGNOSIS — Z30017 Encounter for initial prescription of implantable subdermal contraceptive: Secondary | ICD-10-CM

## 2020-04-22 DIAGNOSIS — Z01419 Encounter for gynecological examination (general) (routine) without abnormal findings: Secondary | ICD-10-CM

## 2020-04-22 MED ORDER — MEDROXYPROGESTERONE ACETATE 150 MG/ML IM SUSP: 150.0000 mg | Freq: Once | INTRAMUSCULAR | Status: DC

## 2020-04-22 MED ORDER — ETONOGESTREL 68 MG ~~LOC~~ IMPL
68.0000 mg | DRUG_IMPLANT | Freq: Once | SUBCUTANEOUS | Status: AC
  Administered 2020-04-22: 68 mg via SUBCUTANEOUS

## 2020-04-22 NOTE — Progress Notes (Signed)
GYNECOLOGY ANNUAL PREVENTATIVE CARE ENCOUNTER NOTE  History:     Kerri Harris is a 29 y.o. G0P0000 female here for a routine annual gynecologic exam and discussion of hormonal contraception options. She is currently getting Depo every 3 months to help with heavy periods. She is unable to tolerate pap smears to do rigidity 2/2 cerebral palsy. Her last exam was in 2018 and pap was collected blindly. Patient is present with mom who states last annual was a bad experience because the patient does not tolerate vaginal exam's of any sort.  She is interested in a LARC, however insertion of an IUD would require sedation.   Current complaints: none. Patient has not been sexually active.    Gynecologic History No LMP recorded. Contraception: Depo-Provera injections Now nexplanon in place.  Last Pap: 2018. Results were: normal with negative HPV   Obstetric History OB History  Gravida Para Term Preterm AB Living  0 0 0 0 0 0  SAB TAB Ectopic Multiple Live Births  0 0 0 0 0    Past Medical History:  Diagnosis Date  . Cerebral palsy ([place])   . Eczema    comes and goes per mother   . Seizures ([place])    Childhood seizures - not since age 74    Past Surgical History:  Procedure Laterality Date  . feet surgery Bilateral   . HEMORRHOID SURGERY N/A 01/22/2018   Procedure: HEMORRHOIDECTOMY SINGLE COLUMN;  Surgeon: Leighton Ruff, MD;  Location: [facility];  Service: General;  Laterality: N/A;  . HIP SURGERY Bilateral   . KNEE SURGERY Bilateral     Current Outpatient Medications on File Prior to Visit  Medication Sig Dispense Refill  . medroxyPROGESTERone (DEPO-PROVERA) 150 MG/ML injection Inject 150 mg into the muscle See admin instructions. Inject 150 mg IM every 2 months     No current facility-administered medications on file prior to visit.    No Known Allergies  Social History:  reports that she has never smoked. She has never used smokeless tobacco. She reports that she does not drink  alcohol or use drugs.  Family History  Problem Relation Age of Onset  . Arthritis Mother   . Hypertension Maternal Grandmother   . Diabetes Maternal Grandmother     The following portions of the patient's history were reviewed and updated as appropriate: allergies, current medications, past family history, past medical history, past social history, past surgical history and problem list.  Review of Systems Pertinent items noted in HPI and remainder of comprehensive ROS otherwise negative.  Physical Exam:  BP 132/88   Pulse 97   Ht 4\' 11"  (1.499 m)   Wt 104 lb (47.2 kg)   BMI 21.01 kg/m  CONSTITUTIONAL: Well-developed, well-nourished female in no acute distress.  HENT:  Normocephalic EYES: Conjunctivae and EOM are normal. Pupils are equal, round, and reactive to light.  NECK: Normal range of motion, supple, no masses.  Normal thyroid.  SKIN: Skin is warm and dry. No rash noted. Not diaphoretic. No erythema. No pallor. MUSCULOSKELETAL: rigidity of extremities with bilateral spasticity.  NEUROLOGIC: Alert and oriented to person, place, and time.  PSYCHIATRIC: Flat affect. Her behavior is normal.  CARDIOVASCULAR: Normal heart rate noted, regular rhythm RESPIRATORY: Clear to auscultation bilaterally. Effort and breath sounds normal, no problems with respiration noted. BREASTS: Symmetric in size. No masses, tenderness, skin changes, nipple drainage, or lymphadenopathy bilaterally. Performed in the presence of a chaperone. ABDOMEN: Soft, no distention noted.  No tenderness, rebound  or guarding.  PELVIC: Deferred    Assessment and Plan:   1. Women's annual routine gynecological examination  Patient's mother is present today and is patient's legal guardian. Patient's mother highly interested in patient having IUD placed. Discussed if she decides to do this we would need to play under general anesthesia; at that time we would collect a PAP smear.    2. Nexplanon insertion  Patient  verbally agreed to nexplanon insertion Patient's mother who is legal guardian signed consent form.     Routine preventative health maintenance measures emphasized. Please refer to After Visit Summary for other counseling recommendations.    Venia Carbon I, NP 04/23/2020 11:02 AM

## 2020-04-22 NOTE — Progress Notes (Signed)
Nexplanon placed instead of depo.

## 2020-04-22 NOTE — Progress Notes (Signed)
     GYNECOLOGY OFFICE PROCEDURE NOTE  Kerri Harris is a 29 y.o. G0P0000 here for Nexplanon insertion.  Last pap smear was on 2018 and was normal.  No other gynecologic concerns.  Nexplanon Insertion Procedure Patient identified, informed consent performed, consent signed.   Patient does understand that irregular bleeding is a very common side effect of this medication. She was advised to have backup contraception for one week after placement. Pregnancy test in clinic today was negative.  Appropriate time out taken.  Patient's left arm was prepped and draped in the usual sterile fashion. The ruler used to measure and mark insertion area.  Patient was prepped with alcohol swab and then injected with 3 ml of 1% lidocaine.  She was prepped with betadine, Nexplanon removed from packaging,  Device confirmed in needle, then inserted full length of needle and withdrawn per handbook instructions. Nexplanon was able to palpated in the patient's arm; patient palpated the insert herself. There was minimal blood loss.  Patient insertion site covered with guaze and a pressure bandage to reduce any bruising.  The patient tolerated the procedure well and was given post procedure instructions.     Duane Lope, NP 04/23/2020 10:54 AM

## 2020-05-04 ENCOUNTER — Ambulatory Visit: Payer: Managed Care, Other (non HMO)

## 2020-05-05 ENCOUNTER — Ambulatory Visit: Payer: Managed Care, Other (non HMO)

## 2020-07-14 ENCOUNTER — Telehealth: Payer: Self-pay

## 2020-07-14 NOTE — Telephone Encounter (Signed)
Patients mother contacted [place] regarding patients need for PCS during the day while mother is at work. Pt has cerebral palsy. Form has been completed, signed and faxed. Message sent to mother.

## 2020-08-04 ENCOUNTER — Encounter: Payer: Self-pay | Admitting: Internal Medicine

## 2020-08-04 ENCOUNTER — Other Ambulatory Visit: Payer: Self-pay

## 2020-08-04 ENCOUNTER — Ambulatory Visit (INDEPENDENT_AMBULATORY_CARE_PROVIDER_SITE_OTHER): Payer: Managed Care, Other (non HMO) | Admitting: Internal Medicine

## 2020-08-04 VITALS — BP 124/78 | HR 80 | Temp 98.5°F | Resp 16 | Ht 59.0 in | Wt 105.0 lb

## 2020-08-04 DIAGNOSIS — M79671 Pain in right foot: Secondary | ICD-10-CM | POA: Diagnosis not present

## 2020-08-04 DIAGNOSIS — M79672 Pain in left foot: Secondary | ICD-10-CM

## 2020-08-04 DIAGNOSIS — Z742 Need for assistance at home and no other household member able to render care: Secondary | ICD-10-CM

## 2020-08-04 DIAGNOSIS — Z23 Encounter for immunization: Secondary | ICD-10-CM | POA: Diagnosis not present

## 2020-08-04 DIAGNOSIS — Q6680 Congenital vertical talus deformity, unspecified foot: Secondary | ICD-10-CM | POA: Diagnosis not present

## 2020-08-04 DIAGNOSIS — G800 Spastic quadriplegic cerebral palsy: Secondary | ICD-10-CM | POA: Diagnosis not present

## 2020-08-04 NOTE — Progress Notes (Signed)
Subjective:  Patient ID: Kerri Harris, female    DOB: 1991-07-09  Age: 29 y.o. MRN: [id number]  CC: Follow-up  This visit occurred during the SARS-CoV-2 public health emergency.  Safety protocols were in place, including screening questions prior to the visit, additional usage of staff PPE, and extensive cleaning of exam room while observing appropriate contact time as indicated for disinfecting solutions.    HPI Kerri Harris presents for f/up - She is here to be seen so that she can continue to receive [facility] services. Her mother tells me that she is doing well with no complaints or changes in her well being.  Outpatient Medications Prior to Visit  Medication Sig Dispense Refill   medroxyPROGESTERone (DEPO-PROVERA) 150 MG/ML injection Inject 150 mg into the muscle See admin instructions. Inject 150 mg IM every 2 months (Patient not taking: Reported on 08/04/2020)     No facility-administered medications prior to visit.    ROS Review of Systems  Constitutional: Negative for appetite change, diaphoresis, fatigue and unexpected weight change.  HENT: Negative.   Eyes: Negative.   Respiratory: Negative for cough, chest tightness, shortness of breath and wheezing.   Cardiovascular: Negative for chest pain, palpitations and leg swelling.  Gastrointestinal: Negative for abdominal pain.  Endocrine: Negative.   Genitourinary: Negative.  Negative for difficulty urinating.  Musculoskeletal: Positive for arthralgias and gait problem.  Skin: Negative.  Negative for rash.  Neurological: Positive for weakness. Negative for dizziness and headaches.  Hematological: Negative for adenopathy. Does not bruise/bleed easily.  Psychiatric/Behavioral: Negative.     Objective:  BP 124/78    Pulse 80    Temp 98.5 F (36.9 C) (Oral)    Resp 16    Ht 4\' 11"  (1.499 m)    Wt 105 lb (47.6 kg)    SpO2 98%    BMI 21.21 kg/m   BP Readings from Last 3 Encounters:  08/04/20 124/78  04/22/20 132/88  02/16/20  118/78    Wt Readings from Last 3 Encounters:  08/04/20 105 lb (47.6 kg)  04/22/20 104 lb (47.2 kg)  02/16/20 100 lb (45.4 kg)    Physical Exam Vitals reviewed.  Constitutional:      Appearance: She is ill-appearing (in a chair).  HENT:     Nose: Nose normal.     Mouth/Throat:     Mouth: Mucous membranes are moist.  Eyes:     General: No scleral icterus.    Conjunctiva/sclera: Conjunctivae normal.  Cardiovascular:     Rate and Rhythm: Normal rate and regular rhythm.     Heart sounds: No murmur heard.   Pulmonary:     Effort: Pulmonary effort is normal.     Breath sounds: No stridor. No wheezing, rhonchi or rales.  Abdominal:     General: Abdomen is flat. Bowel sounds are normal. There is no distension.     Palpations: Abdomen is soft. There is no hepatomegaly.     Tenderness: There is no abdominal tenderness.  Musculoskeletal:        General: No swelling. Normal range of motion.     Cervical back: Neck supple.     Right lower leg: No edema.     Left lower leg: No edema.  Lymphadenopathy:     Cervical: No cervical adenopathy.  Skin:    General: Skin is warm and dry.     Findings: No rash.  Neurological:     Mental Status: She is oriented to person, place, and time. Mental status is  at baseline.     Comments: In an upright wheelchair  Strength BUE 3/5                BLE 1/5  Psychiatric:        Mood and Affect: Mood normal.        Behavior: Behavior normal.     Lab Results  Component Value Date   WBC 8.2 09/02/2019   HGB 13.2 09/02/2019   HCT 40.1 09/02/2019   PLT 345.0 09/02/2019   GLUCOSE 95 09/02/2019   CHOL 139 08/07/2017   TRIG 148.0 08/07/2017   HDL 49.00 08/07/2017   LDLCALC 61 08/07/2017   ALT 10 08/22/2019   AST 13 08/22/2019   NA 139 09/02/2019   K 3.6 09/02/2019   CL 105 09/02/2019   CREATININE 0.74 09/02/2019   BUN 15 09/02/2019   CO2 23 09/02/2019   TSH 1.69 06/11/2015   HGBA1C 5.3 11/14/2017    DG Foot 2 Views Left  Result  Date: 09/03/2019 CLINICAL DATA:  Left foot pain and swelling, history of cerebral palsy EXAM: LEFT FOOT - 2 VIEW COMPARISON:  None. FINDINGS: Suboptimal projections were obtained due to difficulty with positioning. Pes planus deformity of the foot incompletely assessed on non standard views. No acute fracture or traumatic malalignment. Diffuse soft tissue swelling is present of the left lower extremity most pronounced over the forefoot. No visible erosion, soft tissue gas or foreign body. No osseous erosion or periostitis to suggest early radiographic features of osteomyelitis. IMPRESSION: 1. Diffuse soft tissue swelling most pronounced about the forefoot. No radiographic evidence of osteomyelitis. 2. Pes planus deformity of the foot incompletely assessed on non standard views. Electronically Signed   By: Kreg Shropshire M.D.   On: 09/03/2019 00:32   DG Foot 2 Views Right  Result Date: 09/03/2019 CLINICAL DATA:  Bilateral foot pain and swelling for 2 weeks, history of cerebral palsy EXAM: RIGHT FOOT - 2 VIEW COMPARISON:  None. FINDINGS: Rocker bottom deformity of the foot compatible with history of cerebral palsy. No acute fracture or traumatic malalignment. Diffuse soft tissue swelling is present of the right lower extremity most pronounced over the forefoot and plantar aspect subjacent to the calcaneus. No visible erosion, soft tissue gas or foreign body. No osseous erosion or periostitis to suggest early radiographic features of osteomyelitis. IMPRESSION: 1. Diffuse soft tissue swelling of the right lower extremity without radiographic evidence of osteomyelitis. 2. Rocker bottom deformity of the foot compatible with history of cerebral palsy. Electronically Signed   By: Kreg Shropshire M.D.   On: 09/03/2019 00:30    Assessment & Plan:   Kerri Harris was seen today for follow-up.  Diagnoses and all orders for this visit:  Spastic quadriplegic cerebral palsy (HCC) -     Ambulatory referral to Home Health  Needs  assistance while at home -     Ambulatory referral to Home Health  Foot pain, bilateral -     Ambulatory referral to Home Health  Rocker bottom foot deformity -     Ambulatory referral to Home Health  Other orders -     Flu Vaccine QUAD 6+ mos PF IM (Fluarix Quad PF)   I am having Kerri Harris maintain her medroxyPROGESTERone.  No orders of the defined types were placed in this encounter.    Follow-up: No follow-ups on file.  Sanda Linger, MD

## 2020-08-05 ENCOUNTER — Encounter: Payer: Self-pay | Admitting: Internal Medicine

## 2020-08-05 NOTE — Patient Instructions (Signed)
Health Maintenance, Female Adopting a healthy lifestyle and getting preventive care are important in promoting health and wellness. Ask your health care provider about:  The right schedule for you to have regular tests and exams.  Things you can do on your own to prevent diseases and keep yourself healthy. What should I know about diet, weight, and exercise? Eat a healthy diet   Eat a diet that includes plenty of vegetables, fruits, low-fat dairy products, and lean protein.  Do not eat a lot of foods that are high in solid fats, added sugars, or sodium. Maintain a healthy weight Body mass index (BMI) is used to identify weight problems. It estimates body fat based on height and weight. Your health care provider can help determine your BMI and help you achieve or maintain a healthy weight. Get regular exercise Get regular exercise. This is one of the most important things you can do for your health. Most adults should:  Exercise for at least 150 minutes each week. The exercise should increase your heart rate and make you sweat (moderate-intensity exercise).  Do strengthening exercises at least twice a week. This is in addition to the moderate-intensity exercise.  Spend less time sitting. Even light physical activity can be beneficial. Watch cholesterol and blood lipids Have your blood tested for lipids and cholesterol at 30 years of age, then have this test every 5 years. Have your cholesterol levels checked more often if:  Your lipid or cholesterol levels are high.  You are older than 30 years of age.  You are at high risk for heart disease. What should I know about cancer screening? Depending on your health history and family history, you may need to have cancer screening at various ages. This may include screening for:  Breast cancer.  Cervical cancer.  Colorectal cancer.  Skin cancer.  Lung cancer. What should I know about heart disease, diabetes, and high blood  pressure? Blood pressure and heart disease  High blood pressure causes heart disease and increases the risk of stroke. This is more likely to develop in people who have high blood pressure readings, are of African descent, or are overweight.  Have your blood pressure checked: ? Every 3-5 years if you are 18-39 years of age. ? Every year if you are 40 years old or older. Diabetes Have regular diabetes screenings. This checks your fasting blood sugar level. Have the screening done:  Once every three years after age 40 if you are at a normal weight and have a low risk for diabetes.  More often and at a younger age if you are overweight or have a high risk for diabetes. What should I know about preventing infection? Hepatitis B If you have a higher risk for hepatitis B, you should be screened for this virus. Talk with your health care provider to find out if you are at risk for hepatitis B infection. Hepatitis C Testing is recommended for:  Everyone born from 1945 through 1965.  Anyone with known risk factors for hepatitis C. Sexually transmitted infections (STIs)  Get screened for STIs, including gonorrhea and chlamydia, if: ? You are sexually active and are younger than 30 years of age. ? You are older than 30 years of age and your health care provider tells you that you are at risk for this type of infection. ? Your sexual activity has changed since you were last screened, and you are at increased risk for chlamydia or gonorrhea. Ask your health care provider if   you are at risk.  Ask your health care provider about whether you are at high risk for HIV. Your health care provider may recommend a prescription medicine to help prevent HIV infection. If you choose to take medicine to prevent HIV, you should first get tested for HIV. You should then be tested every 3 months for as long as you are taking the medicine. Pregnancy  If you are about to stop having your period (premenopausal) and  you may become pregnant, seek counseling before you get pregnant.  Take 400 to 800 micrograms (mcg) of folic acid every day if you become pregnant.  Ask for birth control (contraception) if you want to prevent pregnancy. Osteoporosis and menopause Osteoporosis is a disease in which the bones lose minerals and strength with aging. This can result in bone fractures. If you are 65 years old or older, or if you are at risk for osteoporosis and fractures, ask your health care provider if you should:  Be screened for bone loss.  Take a calcium or vitamin D supplement to lower your risk of fractures.  Be given hormone replacement therapy (HRT) to treat symptoms of menopause. Follow these instructions at home: Lifestyle  Do not use any products that contain nicotine or tobacco, such as cigarettes, e-cigarettes, and chewing tobacco. If you need help quitting, ask your health care provider.  Do not use street drugs.  Do not share needles.  Ask your health care provider for help if you need support or information about quitting drugs. Alcohol use  Do not drink alcohol if: ? Your health care provider tells you not to drink. ? You are pregnant, may be pregnant, or are planning to become pregnant.  If you drink alcohol: ? Limit how much you use to 0-1 drink a day. ? Limit intake if you are breastfeeding.  Be aware of how much alcohol is in your drink. In the [place] one drink equals one 12 oz bottle of beer (355 mL), one 5 oz glass of wine (148 mL), or one 1 oz glass of hard liquor (44 mL). General instructions  Schedule regular health, dental, and eye exams.  Stay current with your vaccines.  Tell your health care provider if: ? You often feel depressed. ? You have ever been abused or do not feel safe at home. Summary  Adopting a healthy lifestyle and getting preventive care are important in promoting health and wellness.  Follow your health care provider's instructions about healthy  diet, exercising, and getting tested or screened for diseases.  Follow your health care provider's instructions on monitoring your cholesterol and blood pressure. This information is not intended to replace advice given to you by your health care provider. Make sure you discuss any questions you have with your health care provider. Document Revised: 11/13/2018 Document Reviewed: 11/13/2018 Elsevier Patient Education  2020 [employer]  

## 2020-08-07 ENCOUNTER — Encounter: Payer: Self-pay | Admitting: Internal Medicine

## 2020-11-16 ENCOUNTER — Telehealth: Payer: Self-pay | Admitting: Family Medicine

## 2020-11-16 NOTE — Telephone Encounter (Addendum)
Returned patients mother's call.   Mom voiced that Kwana has the Nexplanon currently that was inserted in May. She is having a lot of cramping with the Nexplanon. She has had intermittent spotting since Nexplanon insertion until November. She reports patient has been bleeding since before Thanksgiving. In the last 3 days patient has been through 2 full packs of pads. Pads are full per mom.   She was previously on Depo Provera and was noted to have prolonged bleeding. Taking daily pills will be too difficult per mom.   Mom would like to know what the options are for [facility] for [employer] and ultimately would like to be able to stop her periods.    Spoke with Dr Barb Merino who recommends that patient come in for visit to discuss options since patient not able to take oral medications.   Called mom to discuss need for in person visit to discuss options. Mom reports she does not want to give patient oral medication and will not let her have the IUD as they are not able to get her legs open even for a Pap Smear.   Mom spoke with front desk and scheduled appt to come in for visit tomorrow.

## 2020-11-16 NOTE — Telephone Encounter (Signed)
Mother Kerri Harris called to make an appointment because her daughter has been bleeding for over a month. She wanted to speak to clinical staff about what other options she might could do, and schedule accordingly.

## 2020-11-17 ENCOUNTER — Ambulatory Visit (INDEPENDENT_AMBULATORY_CARE_PROVIDER_SITE_OTHER): Payer: Managed Care, Other (non HMO) | Admitting: Obstetrics and Gynecology

## 2020-11-17 ENCOUNTER — Other Ambulatory Visit: Payer: Self-pay

## 2020-11-17 ENCOUNTER — Encounter: Payer: Self-pay | Admitting: Obstetrics and Gynecology

## 2020-11-17 DIAGNOSIS — N921 Excessive and frequent menstruation with irregular cycle: Secondary | ICD-10-CM | POA: Diagnosis not present

## 2020-11-17 DIAGNOSIS — Z975 Presence of (intrauterine) contraceptive device: Secondary | ICD-10-CM | POA: Insufficient documentation

## 2020-11-17 NOTE — Progress Notes (Signed)
Hysterectomy statement signed today  Addison Naegeli, RN  11/17/20

## 2020-11-17 NOTE — Patient Instructions (Signed)
Total Laparoscopic Hysterectomy °A total laparoscopic hysterectomy is a minimally invasive surgery to remove the uterus and cervix. The fallopian tubes and ovaries can also be removed (bilateral salpingo-oophorectomy) during this surgery, if necessary. This procedure may be done to treat problems such as: °· Noncancerous growths in the uterus (uterine fibroids) that cause symptoms. °· A condition that causes the lining of the uterus (endometrium) to grow in other areas (endometriosis). °· Problems with pelvic support. This is caused by weakened muscles of the pelvis following vaginal childbirth or menopause. °· Cancer of the cervix, ovaries, uterus, or endometrium. °· Excessive (dysfunctional) uterine bleeding. °This surgery is performed by inserting a thin, lighted tube (laparoscope) and surgical instruments into small incisions in the abdomen. The laparoscope sends images to a monitor. The images help the health care provider perform the procedure. After this procedure, you will no longer be able to have a baby, and you will no longer have a menstrual period. °Tell a health care provider about: °· Any allergies you have. °· All medicines you are taking, including vitamins, herbs, eye drops, creams, and over-the-counter medicines. °· Any problems you or family members have had with anesthetic medicines. °· Any blood disorders you have. °· Any surgeries you have had. °· Any medical conditions you have. °· Whether you are pregnant or may be pregnant. °What are the risks? °Generally, this is a safe procedure. However, problems may occur, including: °· Infection. °· Bleeding. °· Blood clots in the legs or lungs. °· Allergic reactions to medicines. °· Damage to other structures or organs. °· The risk that the surgery may have to be switched to the regular one in which a large incision is made in the abdomen (abdominal hysterectomy). °What happens before the procedure? °Staying hydrated °Follow instructions from your  health care provider about hydration, which may include: °· Up to 2 hours before the procedure - you may continue to drink clear liquids, such as water, clear fruit juice, black coffee, and plain tea °Eating and drinking restrictions °Follow instructions from your health care provider about eating and drinking, which may include: °· 8 hours before the procedure - stop eating heavy meals or foods such as meat, fried foods, or fatty foods. °· 6 hours before the procedure - stop eating light meals or foods, such as toast or cereal. °· 6 hours before the procedure - stop drinking milk or drinks that contain milk. °· 2 hours before the procedure - stop drinking clear liquids. °Medicines °· Ask your health care provider about: °? Changing or stopping your regular medicines. This is especially important if you are taking diabetes medicines or blood thinners. °? Taking over-the-counter medicines, vitamins, herbs, and supplements. °? Taking medicines such as aspirin and ibuprofen. These medicines can thin your blood. Do not take these medicines unless your health care provider tells you to take them. °· You may be given antibiotic medicine to help prevent infection. °· You may be asked to take laxatives. °· You may be given medicines to help prevent nausea and vomiting after the procedure. °General instructions °· Ask your health care provider how your surgical site will be marked or identified. °· You may be asked to shower with a germ-killing soap. °· Do not use any products that contain nicotine or tobacco, such as cigarettes and e-cigarettes. If you need help quitting, ask your health care provider. °· You may have an exam or testing, such as an ultrasound to determine the size and shape of your pelvic organs. °·   You may have a blood or urine sample taken. °· This procedure can affect the way you feel about yourself. Talk with your health care provider about the physical and emotional changes hysterectomy may  cause. °· Plan to have someone take you home from the hospital or clinic. °· Plan to have a responsible adult care for you for at least 24 hours after you leave the hospital or clinic. This is important. °What happens during the procedure? °· To lower your risk of infection: °? Your health care team will wash or sanitize their hands. °? Your skin will be washed with soap. °? Hair may be removed from the surgical area. °· An IV will be inserted into one of your veins. °· You will be given one or more of the following: °? A medicine to help you relax (sedative). °? A medicine to make you fall asleep (general anesthetic). °· You will be given antibiotic medicine through your IV. °· A tube may be inserted down your throat to help you breathe during the procedure. °· A gas (carbon dioxide) will be used to inflate your abdomen to allow your surgeon to see inside of your abdomen. °· Three or four small incisions will be made in your abdomen. °· A laparoscope will be inserted into one of your incisions. Surgical instruments will be inserted through the other incisions in order to perform the procedure. °· Your uterus and cervix may be removed through your vagina or cut into small pieces and removed through the small incisions. Any other organs that need to be removed will also be removed this way. °· Carbon dioxide will be released from inside of your abdomen. °· Your incisions will be closed with stitches (sutures). °· A bandage (dressing) may be placed over your incisions. °The procedure may vary among health care providers and hospitals. °What happens after the procedure? °· Your blood pressure, heart rate, breathing rate, and blood oxygen level will be monitored until the medicines you were given have worn off. °· You will be given medicine for pain and nausea as needed. °· Do not drive for 24 hours if you received a sedative. °Summary °· Total Laparoscopic hysterectomy is a procedure to remove your uterus, cervix and  sometimes the fallopian tubes and ovaries. °· This procedure can affect the way you feel about yourself. Talk with your health care provider about the physical and emotional changes hysterectomy may cause. °· After this procedure, you will no longer be able to have a baby, and you will no longer have a menstrual period. °· You will be given pain medicine to control discomfort after this procedure. °This information is not intended to replace advice given to you by your health care provider. Make sure you discuss any questions you have with your health care provider. °Document Revised: 11/02/2017 Document Reviewed: 01/31/2017 °[employer]vier Patient Education © 2020 [employer] °Total Laparoscopic Hysterectomy, Care After °This sheet gives you information about how to care for yourself after your procedure. Your health care provider may also give you more specific instructions. If you have problems or questions, contact your health care provider. °What can I expect after the procedure? °After the procedure, it is common to have: °· Pain and bruising around your incisions. °· A sore throat, if a breathing tube was used during surgery. °· Fatigue. °· Poor appetite. °· Less interest in sex. °If your ovaries were also removed, it is also common to have symptoms of menopause such as hot flashes, night sweats,   and lack of sleep (insomnia). °Follow these instructions at home: °Bathing °· Do not take baths, swim, or use a hot tub until your health care provider approves. You may need to only take showers for 2-3 weeks. °· Keep your bandage (dressing) dry until your health care provider says it can be removed. °Incision care ° °· Follow instructions from your health care provider about how to take care of your incisions. Make sure you: °? Wash your hands with soap and water before you change your dressing. If soap and water are not available, use hand sanitizer. °? Change your dressing as told by your health care  provider. °? Leave stitches (sutures), skin glue, or adhesive strips in place. These skin closures may need to stay in place for 2 weeks or longer. If adhesive strip edges start to loosen and curl up, you may trim the loose edges. Do not remove adhesive strips completely unless your health care provider tells you to do that. °· Check your incision area every day for signs of infection. Check for: °? Redness, swelling, or pain. °? Fluid or blood. °? Warmth. °? Pus or a bad smell. °Activity °· Get plenty of rest and sleep. °· Do not lift anything that is heavier than 10 lbs (4.5 kg) for one month after surgery, or as long as told by your health care provider. °· Do not drive or use heavy machinery while taking prescription pain medicine. °· Do not drive for 24 hours if you were given a medicine to help you relax (sedative). °· Return to your normal activities as told by your health care provider. Ask your health care provider what activities are safe for you. °Lifestyle ° °· Do not use any products that contain nicotine or tobacco, such as cigarettes and e-cigarettes. These can delay healing. If you need help quitting, ask your health care provider. °· Do not drink alcohol until your health care provider approves. °General instructions °· Do not douche, use tampons, or have sex for at least 6 weeks, or as told by your health care provider. °· Take over-the-counter and prescription medicines only as told by your health care provider. °· To monitor yourself for a fever, take your temperature at least once a day during recovery. °· If you struggle with physical or emotional changes after your procedure, speak with your health care provider or a therapist. °· To prevent or treat constipation while you are taking prescription pain medicine, your health care provider may recommend that you: °? Drink enough fluid to keep your urine clear or pale yellow. °? Take over-the-counter or prescription medicines. °? Eat foods that  are high in fiber, such as fresh fruits and vegetables, whole grains, and beans. °? Limit foods that are high in fat and processed sugars, such as fried and sweet foods. °· Keep all follow-up visits as told by your health care provider. This is important. °Contact a health care provider if: °· You have chills or a fever. °· You have redness, swelling, or pain around an incision. °· You have fluid or blood coming from an incision. °· Your incision feels warm to the touch. °· You have pus or a bad smell coming from an incision. °· An incision breaks open. °· You feel dizzy or light-headed. °· You have pain or bleeding when you urinate. °· You have diarrhea, nausea, or vomiting that does not go away. °· You have abnormal vaginal discharge. °· You have a rash. °· You have pain that does   not get better with medicine. °Get help right away if: °· You have a fever and your symptoms suddenly get worse. °· You have severe abdominal pain. °· You have chest pain. °· You have shortness of breath. °· You faint. °· You have pain, swelling, or redness on your leg. °· You have heavy vaginal bleeding with blood clots. °Summary °· After the procedure it is common to have abdominal pain. Your provider will give you medication for this. °· Do not take baths, swim, or use a hot tub until your health care provider approves. °· Do not lift anything that is heavier than 10 lbs (4.5 kg) for one month after surgery, or as long as told by your health care provider. °· Notify your provider if you have any signs or symptoms of infection after the procedure. °This information is not intended to replace advice given to you by your health care provider. Make sure you discuss any questions you have with your health care provider. °Document Revised: 11/02/2017 Document Reviewed: 01/31/2017 °Elsevier Patient Education © 2020 [employer] ° °

## 2020-11-17 NOTE — Progress Notes (Signed)
Patient ID: Kerri Harris, female   DOB: [birth year], 29 y.o.   MRN: [id number] Kerri Harris presents with her mother to discuss her AUB. Mother reports that this has been a long standing problem for her daughter. Her daughter has CP and her AUB is causing hygiene problems as well frustration for both the pt and her mother, her care giver.  She is unable to take pills. She has a Implanon in place, since May 2021 but continues with AUB. Depo Provera was added but to not avail.  Declines IUD.  Pt and mother desire definite treatment.   Not sexual active   Nulligravid  PE AF  VSS Pt in wheelchair. Able to communicate Lungs clear Heart RRR  A/P AUB refactory to medical treatment  Tx options reviewed with pt and mother. IUD and endometrial ablation declined. Desires definite therapy. [facility] information provided to mother. Will discuss with partners to arrange surgery. Hysterectomy papers signed today.

## 2020-11-18 ENCOUNTER — Encounter: Payer: Self-pay | Admitting: *Deleted

## 2020-11-22 ENCOUNTER — Ambulatory Visit: Payer: Managed Care, Other (non HMO) | Admitting: Obstetrics & Gynecology

## 2020-11-25 ENCOUNTER — Ambulatory Visit: Payer: Managed Care, Other (non HMO) | Admitting: Obstetrics & Gynecology

## 2020-12-27 ENCOUNTER — Telehealth: Payer: Self-pay | Admitting: *Deleted

## 2020-12-27 DIAGNOSIS — N938 Other specified abnormal uterine and vaginal bleeding: Secondary | ICD-10-CM

## 2020-12-27 NOTE — Telephone Encounter (Addendum)
-----   Message from Hermina Staggers, MD sent at 12/27/2020  2:34 PM EST ----- Please schedule pt for GYN U/S d/t DUB. Pt and her mother are aware. Thanks Casimiro Needle   1/24  1620  Pt's mother Kerri Harris was notified of [place] appt on 1/28 @ 11:00 am. Kerri Harris will need to arrive @ 10:45 with a full bladder. She stated that an afternoon appt is needed due to her work schedule. I advised that I will change the appt and call her back. She voiced understanding.   1630  I called back and left a detailed message on her VM stating the appointment has been changed to 1/28 @ 3:45 pm with arrival time of 3:30pm. She may call back if she has questions.

## 2020-12-31 ENCOUNTER — Ambulatory Visit: Payer: Managed Care, Other (non HMO)

## 2020-12-31 ENCOUNTER — Other Ambulatory Visit: Payer: Self-pay | Admitting: Obstetrics and Gynecology

## 2020-12-31 ENCOUNTER — Ambulatory Visit
Admission: RE | Admit: 2020-12-31 | Discharge: 2020-12-31 | Disposition: A | Discharge status: Home / Self Care | Payer: Managed Care, Other (non HMO) | Source: Ambulatory Visit | Attending: Obstetrics and Gynecology | Admitting: Obstetrics and Gynecology

## 2020-12-31 ENCOUNTER — Other Ambulatory Visit: Payer: Self-pay

## 2020-12-31 DIAGNOSIS — N938 Other specified abnormal uterine and vaginal bleeding: Secondary | ICD-10-CM

## 2021-01-21 ENCOUNTER — Other Ambulatory Visit: Payer: Self-pay

## 2021-01-21 ENCOUNTER — Encounter: Payer: Self-pay | Admitting: Obstetrics & Gynecology

## 2021-01-21 ENCOUNTER — Encounter: Payer: Self-pay | Admitting: General Practice

## 2021-01-21 ENCOUNTER — Ambulatory Visit (INDEPENDENT_AMBULATORY_CARE_PROVIDER_SITE_OTHER): Payer: Managed Care, Other (non HMO) | Admitting: Obstetrics & Gynecology

## 2021-01-21 VITALS — BP 126/72 | HR 102 | Ht <= 58 in | Wt 102.0 lb

## 2021-01-21 DIAGNOSIS — N939 Abnormal uterine and vaginal bleeding, unspecified: Secondary | ICD-10-CM | POA: Diagnosis not present

## 2021-01-21 NOTE — Patient Instructions (Signed)
Total Laparoscopic Hysterectomy A total laparoscopic hysterectomy is a minimally invasive surgery to remove the uterus and cervix. The fallopian tubes and ovaries can also be removed during this surgery, if necessary. This procedure may be done to treat problems such as:  Growths in the uterus (uterine fibroids) that are not cancer but cause symptoms.  A condition that causes the lining of the uterus to grow in other areas (endometriosis).  Problems with pelvic support.  Cancer of the cervix, ovaries, uterus, or tissue that lines the uterus (endometrium).  Excessive bleeding in the uterus. After this procedure, you will no longer be able to have a baby, and you will no longer have a menstrual period. Tell a health care provider about:  Any allergies you have.  All medicines you are taking, including vitamins, herbs, eye drops, creams, and over-the-counter medicines.  Any problems you or family members have had with anesthetic medicines.  Any blood disorders you have.  Any surgeries you have had.  Any medical conditions you have.  Whether you are pregnant or may be pregnant. What are the risks? Generally, this is a safe procedure. However, problems may occur, including:  Infection.  Bleeding.  Blood clots in the legs or lungs.  Allergic reactions to medicines.  Damage to nearby structures or organs.  Having to change from this surgery to one in which a large incision is made in the abdomen (abdominal hysterectomy). What happens before the procedure? Staying hydrated Follow instructions from your health care provider about hydration, which may include:  Up to 2 hours before the procedure - you may continue to drink clear liquids, such as water, clear fruit juice, black coffee, and plain tea.   Eating and drinking restrictions Follow instructions from your health care provider about eating and drinking, which may include:  8 hours before the procedure - stop eating  heavy meals or foods, such as meat, fried foods, or fatty foods.  6 hours before the procedure - stop eating light meals or foods, such as toast or cereal.  6 hours before the procedure - stop drinking milk or drinks that contain milk.  2 hours before the procedure - stop drinking clear liquids. Medicines  Ask your health care provider about: ? Changing or stopping your regular medicines. This is especially important if you are taking diabetes medicines or blood thinners. ? Taking medicines such as aspirin and ibuprofen. These medicines can thin your blood. Do not take these medicines unless your health care provider tells you to take them. ? Taking over-the-counter medicines, vitamins, herbs, and supplements.  You may be asked to take medicine that helps you have a bowel movement (laxative) to prevent constipation. General instructions  If you were asked to do bowel preparation before the procedure, follow instructions from your health care provider.  This procedure can affect the way you feel about yourself. Talk with your health care provider about the physical and emotional changes hysterectomy may cause.  Do not use any products that contain nicotine or tobacco for at least 4 weeks before the procedure. These products include cigarettes, chewing tobacco, and vaping devices, such as e-cigarettes. If you need help quitting, ask your health care provider.  Plan to have a responsible adult take you home from the hospital or clinic.  Plan to have a responsible adult care for you for the time you are told after you leave the hospital or clinic. This is important. Surgery safety Ask your health care provider:  How your surgery   site will be marked.  What steps will be taken to help prevent infection. These may include: ? Removing hair at the surgery site. ? Washing skin with a germ-killing soap. ? Receiving antibiotic medicine. What happens during the procedure?  An IV will be  inserted into one of your veins.  You will be given one or more of the following: ? A medicine to help you relax (sedative). ? A medicine to make you fall asleep (general anesthetic). ? A medicine to numb the area (local anesthetic). ? A medicine that is injected into your spine to numb the area below and slightly above the injection site (spinal anesthetic). ? A medicine that is injected into an area of your body to numb everything below the injection site (regional anesthetic).  A gas will be used to inflate your abdomen. This will allow your surgeon to look inside your abdomen and do the surgery.  Three or four small incisions will be made in your abdomen.  A small device with a light (laparoscope) will be inserted into one of your incisions. Surgical instruments will be inserted through the other incisions in order to perform the procedure.  Your uterus and cervix may be removed through your vagina or cut into small pieces and removed through the small incisions. Any other organs that need to be removed will also be removed this way.  The gas will be released from inside your abdomen.  Your incisions will be closed with stitches (sutures), skin glue, or adhesive strips.  A bandage (dressing) may be placed over your incisions. The procedure may vary among health care providers and hospitals. What happens after the procedure?  Your blood pressure, heart rate, breathing rate, and blood oxygen level will be monitored until you leave the hospital or clinic.  You will be given medicine for pain as needed.  You will be encouraged to walk as soon as possible. You will also use a device to help you breathe or do breathing exercises to keep your lungs clear.  You may have to wear compression stockings. These stockings help to prevent blood clots and reduce swelling in your legs.  You will need to wear a sanitary pad for vaginal discharge or bleeding. Summary  Total laparoscopic  hysterectomy is a procedure to remove your uterus, cervix, and sometimes the fallopian tubes and ovaries.  This procedure can affect the way you feel about yourself. Talk with your health care provider about the physical and emotional changes hysterectomy may cause.  After this procedure, you will no longer be able to have a baby, and you will no longer have a menstrual period.  You will be given pain medicine to control discomfort after this procedure.  Plan to have a responsible adult take you home from the hospital or clinic. This information is not intended to replace advice given to you by your health care provider. Make sure you discuss any questions you have with your health care provider. Document Revised: 07/23/2020 Document Reviewed: 07/23/2020 Elsevier Patient Education  2021 Else[employer]. Total Laparoscopic Hysterectomy, Care After The following information offers guidance on how to care for yourself after your procedure. Your health care provider may also give you more specific instructions. If you have problems or questions, contact your health care provider. What can I expect after the procedure? After the procedure, it is common to have:  Pain, bruising, and numbness around your incisions.  Tiredness (fatigue).  Poor appetite.  Less interest in sex.  Vaginal discharge   or bleeding. You will need to use a sanitary pad after this procedure.  Feelings of sadness or other emotions. If your ovaries were also removed, it is also common to have symptoms of menopause, such as hot flashes, night sweats, and lack of sleep (insomnia). Follow these instructions at home: Medicines  Take over-the-counter and prescription medicines only as told by your health care provider.  Ask your health care provider if the medicine prescribed to you: ? Requires you to avoid driving or using machinery. ? Can cause constipation. You may need to take these actions to prevent or treat  constipation:  Drink enough fluid to keep your urine pale yellow.  Take over-the-counter or prescription medicines.  Eat foods that are high in fiber, such as beans, whole grains, and fresh fruits and vegetables.  Limit foods that are high in fat and processed sugars, such as fried or sweet foods. Incision care  Follow instructions from your health care provider about how to take care of your incisions. Make sure you: ? Wash your hands with soap and water for at least 20 seconds before and after you change your bandage (dressing). If soap and water are not available, use hand sanitizer. ? Change your dressing as told by your health care provider. ? Leave stitches (sutures), skin glue, or adhesive strips in place. These skin closures may need to stay in place for 2 weeks or longer. If adhesive strip edges start to loosen and curl up, you may trim the loose edges. Do not remove adhesive strips completely unless your health care provider tells you to do that.  Check your incision areas every day for signs of infection. Check for: ? More redness, swelling, or pain. ? Fluid or blood. ? Warmth. ? Pus or a bad smell.   Activity  Rest as told by your health care provider.  Avoid sitting for a long time without moving. Get up to take short walks every 1-2 hours. This is important to improve blood flow and breathing. Ask for help if you feel weak or unsteady.  Return to your normal activities as told by your health care provider. Ask your health care provider what activities are safe for you.  Do not lift anything that is heavier than 10 lb (4.5 kg), or the limit that you are told, for one month after surgery or until your health care provider says that it is safe.  If you were given a sedative during the procedure, it can affect you for several hours. Do not drive or operate machinery until your health care provider says that it is safe.   Lifestyle  Do not use any products that contain  nicotine or tobacco. These products include cigarettes, chewing tobacco, and vaping devices, such as e-cigarettes. These can delay healing after surgery. If you need help quitting, ask your health care provider.  Do not drink alcohol until your health care provider approves. General instructions  Do not douche, use tampons, or have sex for at least 6 weeks, or as told by your health care provider.  If you struggle with physical or emotional changes after your procedure, speak with your health care provider or a therapist.  Do not take baths, swim, or use a hot tub until your health care provider approves. You may only be allowed to take showers for 2-3 weeks.  Keep your dressing dry until your health care provider says it can be removed.  Try to have someone at home with you for the   first 1-2 weeks to help with your daily chores.  Wear compression stockings as told by your health care provider. These stockings help to prevent blood clots and reduce swelling in your legs.  Keep all follow-up visits. This is important.   Contact a health care provider if:  You have any of these signs of infection: ? Chills or a fever. ? More redness, swelling, or pain around an incision. ? Fluid or blood coming from an incision. ? Warmth coming from an incision. ? Pus or a bad smell coming from an incision.  An incision opens.  You feel dizzy or light-headed.  You have pain or bleeding when you urinate, or you are unable to urinate.  You have abnormal vaginal discharge.  You have pain that does not get better with medicine. Get help right away if:  You have a fever and your symptoms suddenly get worse.  You have severe abdominal pain.  You have chest pain or shortness of breath.  You faint.  You have pain, swelling, or redness in your leg.  You have heavy vaginal bleeding with blood clots, soaking through a sanitary pad in less than 1 hour. These symptoms may represent a serious problem  that is an emergency. Do not wait to see if the symptoms will go away. Get medical help right away. Call your local emergency services (911 in the [place] Do not drive yourself to the hospital. Summary  After the procedure, it is common to have pain and bruising around your incisions.  Do not take baths, swim, or use a hot tub until your health care provider approves.  Do not lift anything that is heavier than 10 lb (4.5 kg), or the limit that you are told, for one month after surgery or until your health care provider says that it is safe.  Tell your health care provider if you have any signs or symptoms of infection after the procedure.  Get help right away if you have severe abdominal pain, chest pain, shortness of breath, or heavy bleeding from your vagina. This information is not intended to replace advice given to you by your health care provider. Make sure you discuss any questions you have with your health care provider. Document Revised: 07/23/2020 Document Reviewed: 07/23/2020 Elsevier Patient Education  2021 Elsevier Inc.  

## 2021-01-21 NOTE — Progress Notes (Signed)
History:  30 y.o. G0P0000 here today for hysterectomy consult. Pt was seen by Dr. Alysia Penna and pt and her mother desire a total laparoscopic hysterectomy with bilateral salpingectomy. Given her physical condition he referred her to me for consideration of robotic hysterectomy. The pt has CP and her AUB is causing hygiene problems as well frustration for both the pt and her mother, her primary care giver.  Pt has failed multiple methods to control her cycles. Pt is unable to take pills. She has a Nexplanon in place, since May 2021 and received a Depo Provera injection but, she continues to have bleeding. She bleeds 2 weeks out of every month. Her endometrium is normal on [place]. Declines IUD. Pt and mother desire definite treatment. Her mother is concerned about her being on prolonged meds.    The following portions of the patient's history were reviewed and updated as appropriate: allergies, current medications, past family history, past medical history, past social history, past surgical history and problem list.  Review of Systems:  Pertinent items are noted in HPI.    Objective:  Physical Exam Blood pressure 126/72, pulse (!) 102, height 4\' 9"  (1.448 m), weight 102 lb (46.3 kg), last menstrual period 01/16/2021.  CONSTITUTIONAL: Well-developed, well-nourished female in no acute distress.  HENT:  Normocephalic, atraumatic EYES: Conjunctivae and EOM are normal. No scleral icterus.  NECK: Normal range of motion SKIN: Skin is warm and dry. No rash noted. Not diaphoretic.No pallor. NEUROLGIC: Alert and oriented to person, place, and time. Normal coordination.  Abd: Soft, nontender and nondistended Pelvic: must be completed under anesthesia  Labs and Imaging 01/18/2021 PELVIS (TRANSABDOMINAL ONLY)  Result Date: 12/31/2020 CLINICAL DATA:  Dysfunctional uterine bleeding, history cerebral palsy; LMP approximately 12/21/2020 EXAM: TRANSABDOMINAL ULTRASOUND OF PELVIS TECHNIQUE: Transabdominal ultrasound  examination of the pelvis was performed including evaluation of the uterus, ovaries, adnexal regions, and pelvic cul-de-sac. Patient unable to undergo transvaginal imaging. COMPARISON:  None FINDINGS: Uterus Measurements: 7.5 x 3.4 x 4.4 cm = volume: 58 mL. Anteverted. Normal morphology without mass Endometrium Thickness: 4 mm.  No endometrial fluid or focal abnormality Right ovary Not visualized, likely obscured by bowel Left ovary Measurements: 4.7 x 3.1 x 3.8 cm = volume: 28.5 mL. Complex cystic lesion within LEFT adnexa, 3.6 x 3.0 x 2.7 cm, containing scattered internal echogenicity and partial septation, could potentially represent a hemorrhagic cyst. Other findings: Small amount of free pelvic fluid. No additional adnexal masses. IMPRESSION: Unremarkable uterus and endometrial complex. Nonvisualization of RIGHT ovary. Complex cystic lesion LEFT adnexa 3.6 cm greatest size, a probably benign cyst question hemorrhagic cyst; short-term follow-up ultrasound recommended in 6-12 weeks. Electronically Signed   By: 8-12 M.D.   On: 12/31/2020 16:25    Assessment & Plan:  AUB despite multiple conservative measures. Pt unble to have awake exam due to severe spasms and contractures.    Patient and mother, who is her legal guardian, both desire surgical management with robot assisted total laparoscopic hysterectomy with bilateral salpingectomy and removal of Nexplanon.  The risks of surgery were discussed in detail with the patient including but not limited to: bleeding which may require transfusion or reoperation; infection which may require prolonged hospitalization or re-hospitalization and antibiotic therapy; injury to bowel, bladder, ureters and major vessels or other surrounding organs; need for additional procedures including laparotomy; thromboembolic phenomenon, incisional problems and other postoperative or anesthesia complications.  Patient was told that the likelihood that her condition and  symptoms will be treated effectively with this  surgical management was very high; the postoperative expectations were also discussed in detail. The patient also understands the alternative treatment options which were discussed in full. All questions were answered.  She was told that she will be contacted by our surgical scheduler regarding the time and date of her surgery; routine preoperative instructions of having nothing to eat or drink after midnight on the day prior to surgery and also coming to the hospital 1 1/2 hours prior to her time of surgery were also emphasized.  She was told she may be called for a preoperative appointment about a week prior to surgery and will be given further preoperative instructions at that visit. Printed patient education handouts about the procedure were given to the patient to review at home.  Total face-to-face time with patient was 35 min.  Greater than 50% was spent in counseling and coordination of care with the patient.   Kerri Harris L. Harraway-Smith, M.D., FACOG   Addendum per anesthesia:  "We do not need to see this patient prior to day or surgery. If she has contractures, anesthesia and paralysis may not fully rid her of those during the case, but it is not possible to know prior to surgery. If it is just spasms, those likely will not be an issue.   Thanks,  Desmond Lope"

## 2021-01-24 ENCOUNTER — Encounter: Payer: Self-pay | Admitting: Obstetrics & Gynecology

## 2021-02-24 ENCOUNTER — Telehealth: Payer: Self-pay | Admitting: *Deleted

## 2021-02-24 NOTE — Telephone Encounter (Signed)
Call to patient's mother, Shanda Bumps ( listed on DPR.) Advised surgery time change to 0730 and arrival at 0530 on 03-08-21. Voiced understanding.  Encounter closed.

## 2021-03-01 ENCOUNTER — Other Ambulatory Visit: Payer: Self-pay

## 2021-03-01 ENCOUNTER — Encounter (HOSPITAL_BASED_OUTPATIENT_CLINIC_OR_DEPARTMENT_OTHER): Payer: Self-pay | Admitting: Obstetrics & Gynecology

## 2021-03-01 NOTE — Progress Notes (Signed)
YOU ARE SCHEDULED FOR A COVID TEST 03-04-2021@  1000 AM. THIS TEST MUST BE DONE BEFORE SURGERY. GO TO  [address] [place], [place], IT IS APPROXIMATELY 2 MINUTES PAST [employer] ON THE RIGHT AND REMAIN IN YOUR CAR, THIS IS A DRIVE UP TEST. ONCE YOUR COVID TEST IS DONE PLEASE FOLLOW ALL THE QUARANTINE  INSTRUCTIONS GIVEN IN YOUR HANDOUT.      Your procedure is scheduled on 03-08-2021  Report to [facility] AT  530  A. M.   Call this number if you have problems the morning of surgery  [phone].   OUR ADDRESS IS [address].  [facility] ARE LOCATED IN [facility].  PLEASE BRING YOUR INSURANCE CARD AND PHOTO ID DAY OF SURGERY.  ONLY ONE PERSON ALLOWED IN FACILITY WAITING AREA.                                     REMEMBER:  DO NOT EAT FOOD, CANDY GUM OR MINTS  AFTER MIDNIGHT . YOU MAY HAVE CLEAR LIQUIDS FROM MIDNIGHT UNTIL 430 AM.  NO CLEAR LIQUIDS AFTER  430 AM DAY OF SURGERY.   YOU MAY  BRUSH YOUR TEETH MORNING OF SURGERY AND RINSE YOUR MOUTH OUT, NO CHEWING GUM CANDY OR MINTS.    CLEAR LIQUID DIET   Foods Allowed                                                                     Foods Excluded  Coffee and tea, regular and decaf                             liquids that you cannot  Plain Jell-O any favor except red or purple                                           see through such as: Fruit ices (not with fruit pulp)                                     milk, soups, orange juice  Iced Popsicles                                    All solid food Carbonated beverages, regular and diet                                    Cranberry, grape and apple juices Sports drinks like Gatorade Lightly seasoned clear broth or consume(fat free) Sugar, honey syrup  Sample Menu Breakfast                                Lunch  Supper Cranberry juice                    Beef broth                            Chicken  broth Jell-O                                     Grape juice                           Apple juice Coffee or tea                        Jell-O                                      Popsicle                                                Coffee or tea                        Coffee or tea  _____________________________________________________________________     TAKE THESE MEDICATIONS MORNING OF SURGERY WITH A SIP OF WATER: NONE  PLEASE STAY IN BUILDING AT ALL TIMES DURING AND AFTER SURGERY.                                    DO NOT WEAR JEWERLY, MAKE UP. DO NOT WEAR LOTIONS, POWDERS, PERFUMES OR DEODORANT. DO NOT SHAVE FOR 24 HOURS PRIOR TO DAY OF SURGERY. MEN MAY SHAVE FACE AND NECK. CONTACTS, GLASSES, OR DENTURES MAY NOT BE WORN TO SURGERY.                                    [facility] IS NOT RESPONSIBLE  FOR ANY BELONGINGS.                                                                    Marland Kitchen            - Preparing for Surgery Before surgery, you can play an important role.  Because skin is not sterile, your skin needs to be as free of germs as possible.  You can reduce the number of germs on your skin by washing with CHG (chlorahexidine gluconate) soap before surgery.  CHG is an antiseptic cleaner which kills germs and bonds with the skin to continue killing germs even after washing. Please DO NOT use if you have an allergy to CHG or antibacterial soaps.  If your skin becomes reddened/irritated stop using the CHG and inform your nurse when you arrive at Short Stay. Do not shave (including  legs and underarms) for at least 48 hours prior to the first CHG shower.  You may shave your face/neck. Please follow these instructions carefully:  1.  Shower with CHG Soap the night before surgery and the  morning of Surgery.  2.  If you choose to wash your hair, wash your hair first as usual with your  normal  shampoo.  3.  After you shampoo, rinse your hair and body thoroughly to remove  the  shampoo.                            4.  Use CHG as you would any other liquid soap.  You can apply chg directly  to the skin and wash                      Gently with a scrungie or clean washcloth.  5.  Apply the CHG Soap to your body ONLY FROM THE NECK DOWN.   Do not use on face/ open                           Wound or open sores. Avoid contact with eyes, ears mouth and genitals (private parts).                       Wash face,  Genitals (private parts) with your normal soap.             6.  Wash thoroughly, paying special attention to the area where your surgery  will be performed.  7.  Thoroughly rinse your body with warm water from the neck down.  8.  DO NOT shower/wash with your normal soap after using and rinsing off  the CHG Soap.                9.  Pat yourself dry with a clean towel.            10.  Wear clean pajamas.            11.  Place clean sheets on your bed the night of your first shower and do not  sleep with pets. Day of Surgery : Do not apply any lotions/deodorants the morning of surgery.  Please wear clean clothes to the hospital/surgery center.  FAILURE TO FOLLOW THESE INSTRUCTIONS MAY RESULT IN THE CANCELLATION OF YOUR SURGERY PATIENT SIGNATURE_________________________________  NURSE SIGNATURE__________________________________  ________________________________________________________________________                                                        QUESTIONS Annye Asa PRE OP NURSE PHONE [phone]

## 2021-03-01 NOTE — Progress Notes (Addendum)
Spoke w/ via phone for pre-op interview---pt legal guardian Kerri Harris Lab needs dos---- has lab appt 03-04-2021 900 am for cbc t & s   Serum preg            Lab results------none COVID test ------03-04-2021 1000 Arrive at -------530 am 4-5--2022 NPO after MN NO Solid Food.  Clear liquids from MN until---430 am then npo Med rec completed Medications to take morning of surgery -----none Diabetic medication -----n/a Patient instructed to bring photo id and insurance card day of surgery Patient aware to have Driver (ride ) / caregiver mother legal guardian Kerri Bumps    for 24 hours after surgery  Patient Special Instructions ----mother given extended stay instructions Pre-Op special Istructions -----none Patient verbalized understanding of instructions that were given at this phone interview. Patient denies shortness of breath, chest pain, fever, cough at this phone interview.  Patient is wheel chair for distance ambulates with walker at home, pt legal guardian mother Kerri Harris cell [phone] will transfer patient onto [employer]. Pt verbalizes at times, can feed self and brush teeth per mother Kerri Bumps.  See dr Erin Fulling 01-21-2021 addendum note per dr Desmond Lope anesthesia does not see patient prior to surgery refer to dr Erin Fulling 01-21-2021 addendum note epic.  Legal guardian letter received via email from mother Kerri Bumps zanwer and placed on patient chart

## 2021-03-04 ENCOUNTER — Encounter (HOSPITAL_COMMUNITY)
Admission: RE | Admit: 2021-03-04 | Discharge: 2021-03-04 | Disposition: A | Discharge status: Home / Self Care | Payer: Managed Care, Other (non HMO) | Source: Ambulatory Visit | Attending: Obstetrics & Gynecology | Admitting: Obstetrics & Gynecology

## 2021-03-04 ENCOUNTER — Inpatient Hospital Stay (HOSPITAL_COMMUNITY)
Admission: RE | Admit: 2021-03-04 | Discharge: 2021-03-04 | Disposition: A | Discharge status: Home / Self Care | Payer: Managed Care, Other (non HMO) | Source: Ambulatory Visit

## 2021-03-04 ENCOUNTER — Other Ambulatory Visit: Payer: Self-pay

## 2021-03-04 ENCOUNTER — Other Ambulatory Visit (HOSPITAL_COMMUNITY)
Admission: RE | Admit: 2021-03-04 | Discharge: 2021-03-04 | Disposition: A | Discharge status: Home / Self Care | Payer: Managed Care, Other (non HMO) | Source: Ambulatory Visit | Attending: Obstetrics & Gynecology | Admitting: Obstetrics & Gynecology

## 2021-03-04 ENCOUNTER — Other Ambulatory Visit (HOSPITAL_COMMUNITY): Payer: Managed Care, Other (non HMO)

## 2021-03-04 DIAGNOSIS — Z20822 Contact with and (suspected) exposure to covid-19: Secondary | ICD-10-CM | POA: Insufficient documentation

## 2021-03-04 DIAGNOSIS — Z01812 Encounter for preprocedural laboratory examination: Secondary | ICD-10-CM | POA: Diagnosis present

## 2021-03-04 LAB — CBC
HCT: 40.8 % (ref 36.0–46.0)
Hemoglobin: 13.1 g/dL (ref 12.0–15.0)
MCH: 28 pg (ref 26.0–34.0)
MCHC: 32.1 g/dL (ref 30.0–36.0)
MCV: 87.2 fL (ref 80.0–100.0)
Platelets: 329 10*3/uL (ref 150–400)
RBC: 4.68 MIL/uL (ref 3.87–5.11)
RDW: 12.4 % (ref 11.5–15.5)
WBC: 6.6 10*3/uL (ref 4.0–10.5)
nRBC: 0 % (ref 0.0–0.2)

## 2021-03-04 LAB — HCG, SERUM, QUALITATIVE: Preg, Serum: NEGATIVE

## 2021-03-05 LAB — SARS CORONAVIRUS 2 (TAT 6-24 HRS): SARS Coronavirus 2: NEGATIVE

## 2021-03-07 NOTE — Anesthesia Preprocedure Evaluation (Addendum)
Anesthesia Evaluation  Patient identified by MRN, date of birth, ID band Patient awake    Reviewed: Allergy & Precautions, NPO status , Patient's Chart, lab work & pertinent test results  Airway Mallampati: I  TM Distance: >3 FB Neck ROM: Full    Dental no notable dental hx.    Pulmonary neg pulmonary ROS,    Pulmonary exam normal breath sounds clear to auscultation       Cardiovascular negative cardio ROS Normal cardiovascular exam Rhythm:Regular Rate:Normal     Neuro/Psych Seizures -, Well Controlled,  Cerebral palsy  negative psych ROS   GI/Hepatic negative GI ROS, Neg liver ROS,   Endo/Other  negative endocrine ROS  Renal/GU negative Renal ROS     Musculoskeletal Walker and wheelchair use   Abdominal   Peds  Hematology negative hematology ROS (+)   Anesthesia Other Findings AUB  Reproductive/Obstetrics hcg negative                            Anesthesia Physical Anesthesia Plan  ASA: III  Anesthesia Plan: General   Post-op Pain Management:    Induction: Intravenous  PONV Risk Score and Plan: 4 or greater and Ondansetron, Dexamethasone, Midazolam, Scopolamine patch - Pre-op and Treatment may vary due to age or medical condition  Airway Management Planned: Oral ETT  Additional Equipment:   Intra-op Plan:   Post-operative Plan: Extubation in OR  Informed Consent: I have reviewed the patients History and Physical, chart, labs and discussed the procedure including the risks, benefits and alternatives for the proposed anesthesia with the patient or authorized representative who has indicated his/her understanding and acceptance.     Dental advisory given and Consent reviewed with POA  Plan Discussed with: CRNA  Anesthesia Plan Comments: (Anesthetic plan discussed with patient and mother)       Anesthesia Quick Evaluation

## 2021-03-08 ENCOUNTER — Ambulatory Visit (HOSPITAL_BASED_OUTPATIENT_CLINIC_OR_DEPARTMENT_OTHER)
Admission: RE | Admit: 2021-03-08 | Discharge: 2021-03-08 | Disposition: A | Discharge status: Home / Self Care | Payer: Managed Care, Other (non HMO) | Attending: Obstetrics & Gynecology | Admitting: Obstetrics & Gynecology

## 2021-03-08 ENCOUNTER — Ambulatory Visit (HOSPITAL_BASED_OUTPATIENT_CLINIC_OR_DEPARTMENT_OTHER): Payer: Managed Care, Other (non HMO) | Admitting: Anesthesiology

## 2021-03-08 ENCOUNTER — Encounter (HOSPITAL_BASED_OUTPATIENT_CLINIC_OR_DEPARTMENT_OTHER): Payer: Self-pay | Admitting: Obstetrics & Gynecology

## 2021-03-08 ENCOUNTER — Other Ambulatory Visit: Payer: Self-pay

## 2021-03-08 ENCOUNTER — Encounter (HOSPITAL_BASED_OUTPATIENT_CLINIC_OR_DEPARTMENT_OTHER)
Admission: RE | Disposition: A | Discharge status: Home / Self Care | Payer: Self-pay | Source: Home / Self Care | Attending: Obstetrics & Gynecology

## 2021-03-08 DIAGNOSIS — Z9889 Other specified postprocedural states: Secondary | ICD-10-CM

## 2021-03-08 DIAGNOSIS — Z8261 Family history of arthritis: Secondary | ICD-10-CM | POA: Diagnosis not present

## 2021-03-08 DIAGNOSIS — N939 Abnormal uterine and vaginal bleeding, unspecified: Secondary | ICD-10-CM

## 2021-03-08 DIAGNOSIS — Z833 Family history of diabetes mellitus: Secondary | ICD-10-CM | POA: Diagnosis not present

## 2021-03-08 DIAGNOSIS — G809 Cerebral palsy, unspecified: Secondary | ICD-10-CM | POA: Diagnosis not present

## 2021-03-08 DIAGNOSIS — N920 Excessive and frequent menstruation with regular cycle: Secondary | ICD-10-CM | POA: Insufficient documentation

## 2021-03-08 DIAGNOSIS — E739 Lactose intolerance, unspecified: Secondary | ICD-10-CM | POA: Diagnosis not present

## 2021-03-08 DIAGNOSIS — N72 Inflammatory disease of cervix uteri: Secondary | ICD-10-CM | POA: Insufficient documentation

## 2021-03-08 DIAGNOSIS — Q6689 Other  specified congenital deformities of feet: Secondary | ICD-10-CM | POA: Diagnosis not present

## 2021-03-08 DIAGNOSIS — Z8249 Family history of ischemic heart disease and other diseases of the circulatory system: Secondary | ICD-10-CM | POA: Insufficient documentation

## 2021-03-08 DIAGNOSIS — Z3046 Encounter for surveillance of implantable subdermal contraceptive: Secondary | ICD-10-CM | POA: Diagnosis not present

## 2021-03-08 DIAGNOSIS — Z975 Presence of (intrauterine) contraceptive device: Secondary | ICD-10-CM

## 2021-03-08 DIAGNOSIS — Z79899 Other long term (current) drug therapy: Secondary | ICD-10-CM | POA: Diagnosis not present

## 2021-03-08 HISTORY — PX: ROBOTIC ASSISTED TOTAL HYSTERECTOMY: SHX6085

## 2021-03-08 HISTORY — PX: REMOVAL OF DRUG DELIVERY IMPLANT: SHX6585

## 2021-03-08 HISTORY — DX: Dependence on other enabling machines and devices: Z99.89

## 2021-03-08 LAB — TYPE AND SCREEN
ABO/RH(D): A POS
Antibody Screen: NEGATIVE

## 2021-03-08 LAB — ABO/RH: ABO/RH(D): A POS

## 2021-03-08 SURGERY — HYSTERECTOMY, TOTAL, ROBOT-ASSISTED
Anesthesia: General | Site: Arm Upper | Laterality: Left

## 2021-03-08 MED ORDER — MIDAZOLAM HCL 2 MG/2ML IJ SOLN
INTRAMUSCULAR | Status: AC
  Filled 2021-03-08: qty 2

## 2021-03-08 MED ORDER — BUPIVACAINE HCL (PF) 0.5 % IJ SOLN
INTRAMUSCULAR | Status: DC | PRN
  Administered 2021-03-08: 30 mL

## 2021-03-08 MED ORDER — CEFAZOLIN SODIUM-DEXTROSE 2-4 GM/100ML-% IV SOLN
2.0000 g | INTRAVENOUS | Status: AC
  Administered 2021-03-08: 2 g via INTRAVENOUS

## 2021-03-08 MED ORDER — KETAMINE HCL 10 MG/ML IJ SOLN
INTRAMUSCULAR | Status: DC | PRN
  Administered 2021-03-08: 25 mg via INTRAVENOUS

## 2021-03-08 MED ORDER — ONDANSETRON HCL 4 MG PO TABS
4.0000 mg | ORAL_TABLET | Freq: Four times a day (QID) | ORAL | Status: DC | PRN
Start: 2021-03-08 — End: 2021-03-08

## 2021-03-08 MED ORDER — KETOROLAC TROMETHAMINE 30 MG/ML IJ SOLN
30.0000 mg | Freq: Four times a day (QID) | INTRAMUSCULAR | Status: DC
Start: 2021-03-08 — End: 2021-03-08
  Administered 2021-03-08: 30 mg via INTRAVENOUS

## 2021-03-08 MED ORDER — DOCUSATE SODIUM 100 MG PO CAPS
100.0000 mg | ORAL_CAPSULE | Freq: Two times a day (BID) | ORAL | Status: DC
  Administered 2021-03-08: 100 mg via ORAL

## 2021-03-08 MED ORDER — ACETAMINOPHEN 500 MG PO TABS: 1000.0000 mg | ORAL_TABLET | Freq: Four times a day (QID) | ORAL | Status: DC

## 2021-03-08 MED ORDER — PHENYLEPHRINE 40 MCG/ML (10ML) SYRINGE FOR IV PUSH (FOR BLOOD PRESSURE SUPPORT)
PREFILLED_SYRINGE | INTRAVENOUS | Status: AC
  Filled 2021-03-08: qty 10

## 2021-03-08 MED ORDER — DEXTROSE-NACL 5-0.45 % IV SOLN: INTRAVENOUS | Status: AC

## 2021-03-08 MED ORDER — ONDANSETRON HCL 4 MG/2ML IJ SOLN
INTRAMUSCULAR | Status: DC | PRN
  Administered 2021-03-08: 4 mg via INTRAVENOUS

## 2021-03-08 MED ORDER — LIDOCAINE 2% (20 MG/ML) 5 ML SYRINGE
INTRAMUSCULAR | Status: DC | PRN
  Administered 2021-03-08: 60 mg via INTRAVENOUS

## 2021-03-08 MED ORDER — ONDANSETRON HCL 4 MG/2ML IJ SOLN
INTRAMUSCULAR | Status: AC
  Filled 2021-03-08: qty 2

## 2021-03-08 MED ORDER — ACETAMINOPHEN 500 MG PO TABS: 1000.0000 mg | ORAL_TABLET | Freq: Once | ORAL | Status: DC

## 2021-03-08 MED ORDER — SODIUM CHLORIDE 0.9 % IR SOLN
Status: DC | PRN
  Administered 2021-03-08: 3000 mL
  Administered 2021-03-08: 1000 mL

## 2021-03-08 MED ORDER — SOD CITRATE-CITRIC ACID 500-334 MG/5ML PO SOLN: 30.0000 mL | ORAL | Status: DC

## 2021-03-08 MED ORDER — ACETAMINOPHEN 500 MG PO TABS
ORAL_TABLET | ORAL | Status: AC
  Filled 2021-03-08: qty 2

## 2021-03-08 MED ORDER — FENTANYL CITRATE (PF) 100 MCG/2ML IJ SOLN
INTRAMUSCULAR | Status: DC | PRN
  Administered 2021-03-08 (×5): 50 ug via INTRAVENOUS

## 2021-03-08 MED ORDER — PROPOFOL 10 MG/ML IV BOLUS
INTRAVENOUS | Status: AC
  Filled 2021-03-08: qty 40

## 2021-03-08 MED ORDER — LIDOCAINE 2% (20 MG/ML) 5 ML SYRINGE
INTRAMUSCULAR | Status: DC | PRN
  Administered 2021-03-08: 1.5 mg/kg/h via INTRAVENOUS

## 2021-03-08 MED ORDER — PANTOPRAZOLE SODIUM 40 MG PO TBEC
40.0000 mg | DELAYED_RELEASE_TABLET | Freq: Every day | ORAL | Status: DC
  Administered 2021-03-08: 40 mg via ORAL

## 2021-03-08 MED ORDER — OXYCODONE HCL 5 MG PO TABS
5.0000 mg | ORAL_TABLET | Freq: Once | ORAL | Status: DC | PRN
Start: 2021-03-08 — End: 2021-03-08

## 2021-03-08 MED ORDER — SCOPOLAMINE 1 MG/3DAYS TD PT72
MEDICATED_PATCH | TRANSDERMAL | Status: AC
  Filled 2021-03-08: qty 1

## 2021-03-08 MED ORDER — LIDOCAINE HCL 2 % IJ SOLN
INTRAMUSCULAR | Status: AC
  Filled 2021-03-08: qty 20

## 2021-03-08 MED ORDER — HYDROCODONE-ACETAMINOPHEN 5-325 MG PO TABS: 1.0000 | ORAL_TABLET | Freq: Four times a day (QID) | ORAL | 0 refills | Status: DC | PRN

## 2021-03-08 MED ORDER — HYDROMORPHONE HCL 1 MG/ML IJ SOLN: 0.2000 mg | INTRAMUSCULAR | Status: DC | PRN

## 2021-03-08 MED ORDER — LACTATED RINGERS IV SOLN
INTRAVENOUS | Status: DC
  Administered 2021-03-08: 1000 mL via INTRAVENOUS

## 2021-03-08 MED ORDER — SUGAMMADEX SODIUM 200 MG/2ML IV SOLN
INTRAVENOUS | Status: DC | PRN
  Administered 2021-03-08: 140 mg via INTRAVENOUS

## 2021-03-08 MED ORDER — MIDAZOLAM HCL 2 MG/2ML IJ SOLN
INTRAMUSCULAR | Status: DC | PRN
  Administered 2021-03-08 (×2): 1 mg via INTRAVENOUS

## 2021-03-08 MED ORDER — PHENYLEPHRINE 40 MCG/ML (10ML) SYRINGE FOR IV PUSH (FOR BLOOD PRESSURE SUPPORT)
PREFILLED_SYRINGE | INTRAVENOUS | Status: DC | PRN
  Administered 2021-03-08 (×4): 80 ug via INTRAVENOUS

## 2021-03-08 MED ORDER — ONDANSETRON HCL 4 MG/2ML IJ SOLN: 4.0000 mg | Freq: Four times a day (QID) | INTRAMUSCULAR | Status: DC | PRN

## 2021-03-08 MED ORDER — CEFAZOLIN SODIUM-DEXTROSE 2-4 GM/100ML-% IV SOLN
INTRAVENOUS | Status: AC
  Filled 2021-03-08: qty 100

## 2021-03-08 MED ORDER — PROPOFOL 10 MG/ML IV BOLUS
INTRAVENOUS | Status: DC | PRN
  Administered 2021-03-08 (×2): 50 mg via INTRAVENOUS

## 2021-03-08 MED ORDER — AMISULPRIDE (ANTIEMETIC) 5 MG/2ML IV SOLN: 10.0000 mg | Freq: Once | INTRAVENOUS | Status: DC | PRN

## 2021-03-08 MED ORDER — PROMETHAZINE HCL 25 MG/ML IJ SOLN: 6.2500 mg | INTRAMUSCULAR | Status: DC | PRN

## 2021-03-08 MED ORDER — OXYCODONE-ACETAMINOPHEN 5-325 MG PO TABS
ORAL_TABLET | ORAL | Status: AC
  Filled 2021-03-08: qty 1

## 2021-03-08 MED ORDER — FENTANYL CITRATE (PF) 100 MCG/2ML IJ SOLN: 25.0000 ug | INTRAMUSCULAR | Status: DC | PRN

## 2021-03-08 MED ORDER — DEXAMETHASONE SODIUM PHOSPHATE 10 MG/ML IJ SOLN
INTRAMUSCULAR | Status: AC
  Filled 2021-03-08: qty 1

## 2021-03-08 MED ORDER — IBUPROFEN 800 MG PO TABS: 800.0000 mg | ORAL_TABLET | Freq: Four times a day (QID) | ORAL | Status: DC

## 2021-03-08 MED ORDER — BISACODYL 5 MG PO TBEC
5.0000 mg | DELAYED_RELEASE_TABLET | Freq: Every day | ORAL | Status: DC | PRN
  Filled 2021-03-08: qty 1

## 2021-03-08 MED ORDER — LIDOCAINE 2% (20 MG/ML) 5 ML SYRINGE
INTRAMUSCULAR | Status: AC
  Filled 2021-03-08: qty 5

## 2021-03-08 MED ORDER — KETOROLAC TROMETHAMINE 30 MG/ML IJ SOLN
30.0000 mg | Freq: Once | INTRAMUSCULAR | Status: AC | PRN
Start: 2021-03-08 — End: 2021-03-08
  Administered 2021-03-08: 30 mg via INTRAVENOUS

## 2021-03-08 MED ORDER — DEXAMETHASONE SODIUM PHOSPHATE 10 MG/ML IJ SOLN
INTRAMUSCULAR | Status: DC | PRN
  Administered 2021-03-08: 10 mg via INTRAVENOUS

## 2021-03-08 MED ORDER — OXYCODONE HCL 5 MG/5ML PO SOLN: 5.0000 mg | Freq: Once | ORAL | Status: DC | PRN

## 2021-03-08 MED ORDER — ROCURONIUM BROMIDE 10 MG/ML (PF) SYRINGE
PREFILLED_SYRINGE | INTRAVENOUS | Status: DC | PRN
  Administered 2021-03-08: 40 mg via INTRAVENOUS
  Administered 2021-03-08 (×2): 10 mg via INTRAVENOUS

## 2021-03-08 MED ORDER — POLYETHYLENE GLYCOL 3350 17 G PO PACK: 17.0000 g | PACK | Freq: Every day | ORAL | Status: DC | PRN

## 2021-03-08 MED ORDER — OXYCODONE-ACETAMINOPHEN 5-325 MG PO TABS
1.0000 | ORAL_TABLET | ORAL | Status: DC | PRN
  Administered 2021-03-08 (×2): 1 via ORAL

## 2021-03-08 MED ORDER — INDIGOTINDISULFONATE SODIUM 8 MG/ML IJ SOLN
INTRAMUSCULAR | Status: DC | PRN
  Administered 2021-03-08: 5 mL via INTRAVENOUS

## 2021-03-08 MED ORDER — IBUPROFEN 600 MG PO TABS: 600.0000 mg | ORAL_TABLET | Freq: Four times a day (QID) | ORAL | 1 refills | Status: DC | PRN

## 2021-03-08 MED ORDER — DOCUSATE SODIUM 100 MG PO CAPS
ORAL_CAPSULE | ORAL | Status: AC
  Filled 2021-03-08: qty 1

## 2021-03-08 MED ORDER — KETAMINE HCL 50 MG/5ML IJ SOSY
PREFILLED_SYRINGE | INTRAMUSCULAR | Status: AC
  Filled 2021-03-08: qty 5

## 2021-03-08 MED ORDER — ROCURONIUM BROMIDE 10 MG/ML (PF) SYRINGE
PREFILLED_SYRINGE | INTRAVENOUS | Status: AC
  Filled 2021-03-08: qty 10

## 2021-03-08 MED ORDER — GABAPENTIN 100 MG PO CAPS
ORAL_CAPSULE | ORAL | Status: AC
  Filled 2021-03-08: qty 1

## 2021-03-08 MED ORDER — LACTATED RINGERS IV SOLN: INTRAVENOUS | Status: DC

## 2021-03-08 MED ORDER — KETOROLAC TROMETHAMINE 30 MG/ML IJ SOLN
INTRAMUSCULAR | Status: AC
  Filled 2021-03-08: qty 1

## 2021-03-08 MED ORDER — GABAPENTIN 100 MG PO CAPS: 100.0000 mg | ORAL_CAPSULE | ORAL | Status: DC

## 2021-03-08 MED ORDER — ARTIFICIAL TEARS OPHTHALMIC OINT
TOPICAL_OINTMENT | OPHTHALMIC | Status: AC
  Filled 2021-03-08: qty 3.5

## 2021-03-08 MED ORDER — SCOPOLAMINE 1 MG/3DAYS TD PT72
1.0000 | MEDICATED_PATCH | TRANSDERMAL | Status: DC
  Administered 2021-03-08: 1.5 mg via TRANSDERMAL

## 2021-03-08 MED ORDER — PANTOPRAZOLE SODIUM 40 MG PO TBEC
DELAYED_RELEASE_TABLET | ORAL | Status: AC
  Filled 2021-03-08: qty 1

## 2021-03-08 MED ORDER — ALUM & MAG HYDROXIDE-SIMETH 200-200-20 MG/5ML PO SUSP
30.0000 mL | ORAL | Status: DC | PRN
Start: 2021-03-08 — End: 2021-03-08

## 2021-03-08 MED ORDER — ACETAMINOPHEN 500 MG PO TABS
1000.0000 mg | ORAL_TABLET | ORAL | Status: AC
  Administered 2021-03-08: 1000 mg via ORAL

## 2021-03-08 MED ORDER — FENTANYL CITRATE (PF) 250 MCG/5ML IJ SOLN
INTRAMUSCULAR | Status: AC
  Filled 2021-03-08: qty 5

## 2021-03-08 SURGICAL SUPPLY — 84 items
ADH SKN CLS APL DERMABOND .7 (GAUZE/BANDAGES/DRESSINGS) ×2
APL PRP STRL LF ISPRP CHG 10.5 (MISCELLANEOUS) ×2
APL SRG 38 LTWT LNG FL B (MISCELLANEOUS) ×2
APPLICATOR ARISTA FLEXITIP XL (MISCELLANEOUS) ×3 IMPLANT
APPLICATOR CHLORAPREP 10.5 ORG (MISCELLANEOUS) ×3 IMPLANT
APPLIER CLIP 5 13 M/L LIGAMAX5 (MISCELLANEOUS)
APR CLP MED LRG 5 ANG JAW (MISCELLANEOUS)
BARRIER ADHS 3X4 INTERCEED (GAUZE/BANDAGES/DRESSINGS) IMPLANT
BNDG COHESIVE 3X5 TAN STRL LF (GAUZE/BANDAGES/DRESSINGS) ×3 IMPLANT
BRR ADH 4X3 ABS CNTRL BYND (GAUZE/BANDAGES/DRESSINGS)
CANISTER SUCT 3000ML PPV (MISCELLANEOUS) IMPLANT
CATH FOLEY 3WAY  5CC 16FR (CATHETERS) ×1
CATH FOLEY 3WAY 5CC 16FR (CATHETERS) ×2 IMPLANT
CLIP APPLIE 5 13 M/L LIGAMAX5 (MISCELLANEOUS) IMPLANT
COVER BACK TABLE 60X90IN (DRAPES) ×3 IMPLANT
COVER TIP SHEARS 8 DVNC (MISCELLANEOUS) ×2 IMPLANT
COVER TIP SHEARS 8MM DA VINCI (MISCELLANEOUS) ×1
COVER WAND RF STERILE (DRAPES) ×3 IMPLANT
DECANTER SPIKE VIAL GLASS SM (MISCELLANEOUS) ×3 IMPLANT
DEFOGGER SCOPE WARMER CLEARIFY (MISCELLANEOUS) ×6 IMPLANT
DERMABOND ADVANCED (GAUZE/BANDAGES/DRESSINGS) ×1
DERMABOND ADVANCED .7 DNX12 (GAUZE/BANDAGES/DRESSINGS) ×2 IMPLANT
DRAPE ARM DVNC X/XI (DISPOSABLE) ×6 IMPLANT
DRAPE COLUMN DVNC XI (DISPOSABLE) ×2 IMPLANT
DRAPE DA VINCI XI ARM (DISPOSABLE) ×3
DRAPE DA VINCI XI COLUMN (DISPOSABLE) ×1
DRAPE UTILITY XL STRL (DRAPES) ×3 IMPLANT
DURAPREP 26ML APPLICATOR (WOUND CARE) ×3 IMPLANT
ELECT REM PT RETURN 9FT ADLT (ELECTROSURGICAL) ×3
ELECTRODE REM PT RTRN 9FT ADLT (ELECTROSURGICAL) ×2 IMPLANT
GAUZE SPONGE 4X4 12PLY STRL (GAUZE/BANDAGES/DRESSINGS) ×3 IMPLANT
GLOVE SRG 8 PF TXTR STRL LF DI (GLOVE) ×2 IMPLANT
GLOVE SURG ENC MOIS LTX SZ7 (GLOVE) ×9 IMPLANT
GLOVE SURG POLYISO LF SZ7.5 (GLOVE) ×15 IMPLANT
GLOVE SURG UNDER POLY LF SZ7 (GLOVE) ×9 IMPLANT
GLOVE SURG UNDER POLY LF SZ8 (GLOVE) ×3
HEMOSTAT ARISTA ABSORB 3G PWDR (HEMOSTASIS) ×3 IMPLANT
HOLDER FOLEY CATH W/STRAP (MISCELLANEOUS) IMPLANT
IRRIG SUCT STRYKERFLOW 2 WTIP (MISCELLANEOUS) ×3
IRRIGATION SUCT STRKRFLW 2 WTP (MISCELLANEOUS) ×2 IMPLANT
IV NS IRRIG 3000ML ARTHROMATIC (IV SOLUTION) ×3 IMPLANT
KIT TURNOVER CYSTO (KITS) ×3 IMPLANT
LEGGING LITHOTOMY PAIR STRL (DRAPES) ×3 IMPLANT
MANIFOLD NEPTUNE II (INSTRUMENTS) ×3 IMPLANT
MANIPULATOR ADVINCU DEL 2.5 PL (MISCELLANEOUS) ×3 IMPLANT
MANIPULATOR ADVINCU DEL 3.0 PL (MISCELLANEOUS) IMPLANT
MANIPULATOR ADVINCU DEL 3.5 PL (MISCELLANEOUS) IMPLANT
MANIPULATOR ADVINCU DEL 4.0 PL (MISCELLANEOUS) IMPLANT
NEEDLE HYPO 25X1 1.5 SAFETY (NEEDLE) ×3 IMPLANT
NEEDLE INSUFFLATION 120MM (ENDOMECHANICALS) ×3 IMPLANT
OBTURATOR OPTICAL STANDARD 8MM (TROCAR) ×1
OBTURATOR OPTICAL STND 8 DVNC (TROCAR) ×2
OBTURATOR OPTICALSTD 8 DVNC (TROCAR) ×2 IMPLANT
OCCLUDER COLPOPNEUMO (BALLOONS) IMPLANT
PACK ROBOT WH (CUSTOM PROCEDURE TRAY) ×3 IMPLANT
PACK ROBOTIC GOWN (GOWN DISPOSABLE) ×3 IMPLANT
PACK TRENDGUARD 450 HYBRID PRO (MISCELLANEOUS) ×2 IMPLANT
PAD OB MATERNITY 4.3X12.25 (PERSONAL CARE ITEMS) ×3 IMPLANT
PAD PREP 24X48 CUFFED NSTRL (MISCELLANEOUS) ×3 IMPLANT
PROTECTOR NERVE ULNAR (MISCELLANEOUS) ×6 IMPLANT
SEAL CANN UNIV 5-8 DVNC XI (MISCELLANEOUS) ×6 IMPLANT
SEAL XI 5MM-8MM UNIVERSAL (MISCELLANEOUS) ×3
SEALER VESSEL DA VINCI XI (MISCELLANEOUS) ×1
SEALER VESSEL EXT DVNC XI (MISCELLANEOUS) ×2 IMPLANT
SET IRRIG Y TYPE TUR BLADDER L (SET/KITS/TRAYS/PACK) ×3 IMPLANT
SET TRI-LUMEN FLTR TB AIRSEAL (TUBING) ×3 IMPLANT
STRIP CLOSURE SKIN 1/4X4 (GAUZE/BANDAGES/DRESSINGS) ×3 IMPLANT
SUT VIC AB 0 CT1 27 (SUTURE) ×6
SUT VIC AB 0 CT1 27XBRD ANBCTR (SUTURE) ×4 IMPLANT
SUT VIC AB 4-0 PS2 18 (SUTURE) ×6 IMPLANT
SUT VICRYL 0 UR6 27IN ABS (SUTURE) IMPLANT
SUT VLOC 180 0 9IN  GS21 (SUTURE) ×1
SUT VLOC 180 0 9IN GS21 (SUTURE) ×2 IMPLANT
SYSTEM CARTER THOMASON II (TROCAR) IMPLANT
TIP RUMI ORANGE 6.7MMX12CM (TIP) ×3 IMPLANT
TIP UTERINE 5.1X6CM LAV DISP (MISCELLANEOUS) ×3 IMPLANT
TIP UTERINE 6.7X10CM GRN DISP (MISCELLANEOUS) ×3 IMPLANT
TIP UTERINE 6.7X6CM WHT DISP (MISCELLANEOUS) ×3 IMPLANT
TIP UTERINE 6.7X8CM BLUE DISP (MISCELLANEOUS) ×3 IMPLANT
TOWEL OR 17X26 10 PK STRL BLUE (TOWEL DISPOSABLE) ×3 IMPLANT
TRENDGUARD 450 HYBRID PRO PACK (MISCELLANEOUS) ×3
TROCAR BLADELESS OPT 5 100 (ENDOMECHANICALS) IMPLANT
TROCAR PORT AIRSEAL 5X120 (TROCAR) ×3 IMPLANT
WATER STERILE IRR 1000ML POUR (IV SOLUTION) ×3 IMPLANT

## 2021-03-08 NOTE — Op Note (Signed)
03/08/2021  10:06 AM  PATIENT:  Kerri Harris  30 y.o. female  PRE-OPERATIVE DIAGNOSIS:  AUB  POST-OPERATIVE DIAGNOSIS:  AUB  PROCEDURE:  Procedure(s): XI ROBOTIC ASSISTED TOTAL HYSTERECTOMY WITH SALPINGECTOMY (Bilateral) REMOVAL OF DRUG DELIVERY IMPLANT (NEXPLANON) FROM  LEFT UPPER ARM (Left)  SURGEON:  Surgeon(s) and Role:    * Willodean Rosenthal, MD - Primary    * Constant, Peggy, MD - Assisting  ANESTHESIA:   local and general  EBL:  10 mL   BLOOD ADMINISTERED:none  DRAINS: none   LOCAL MEDICATIONS USED:  MARCAINE     SPECIMEN:  Source of Specimen:  nexplanon; uterus wtih cervix and bilteral fallopian tubes.    DISPOSITION OF SPECIMEN:  PATHOLOGY; the Nexplanon was discarded  COUNTS:  YES  TOURNIQUET:  * No tourniquets in log *  DICTATION: .Note written in EPIC  PLAN OF CARE: Prolonged observation followed by discharge later today.     PATIENT DISPOSITION:  PACU - hemodynamically stable.   Delay start of Pharmacological VTE agent (>24hrs) due to surgical blood loss or risk of bleeding: not applicable  Complications: none immediate.   Pt is a 30 yo with long time history of menorrhagia. She has failed conservative management and continued to have bleeding despite current Nexplanon.   Findings: small uterus with normal ovaries and fallopian tubes bilaterally. Appropriate relaxation once the general anesthetic a was administered.      Procedure: The risks, benefits, and alternatives of surgery were explained, understood, and accepted. Consents were signed. All questions were answered by the patient and her mother. The patient was taken to the operating room and general anesthesia was applied without complication.Patient given informed consent, she signed consent form. Appropriate time out taken.  Patient's left arm was prepped and draped in the usual sterile fashion. Patient was prepped with Chloroprep swab and then injected with 3 ml of 1 % lidocaine.  A #15  blade was used to make a small incision over the Nexplanon rod.  Using curved forceps, the end of the rod was grasped and the scar tissue was removed and the device was removed intact.  There was minimal blood loss. The incision site was covered with guaze and a pressure bandage to reduce any bruising.    At this point, the patient was placed in the dorsal lithotomy position and her abdomen and vagina were prepped and draped after she had been carefully positioned on the table. Using small Deaver retractors, the cervix was identified and grasped with a single toothed tenaculum. The uterus was sounded to 6 cm. A Advincula uterine manipulator with KOH ring was placed without difficulty. A Foley catheter was placed and it drained clear throughout the case. Gloves were changed and attention was turned to the abdomen. A 5 mm incision was made in the umbilicus and a Veress needle was placed intraperitoneally. CO2 was used to insufflate the abdomen to approximately 4 L. After good pneumoperitoneum was established, a 8 mm trocar was placed in the umbilicus.  Laparoscopy confirmed correct placement. She was placed in Trendelenburg position and ports were placed in appropriate positions on her abdomen to allow maximum exposure during the robotic case. Specifically there was an 14mm assistant port placed in the left upper quadrant under direct laproscopic visualization. Two 8 mm ports were placed 8cm inferolateral to the midline port.  These were all placed under direct laparoscopic visualization. The robot was docked and I proceeded with a robotic portion of the case.  The pelvis was inspected  and the uterus was identified. The anatomy was all within normal limits. The fallopian tubes and ovaries were found to be normal. The remainder of her pelvis also appeared normal.  The ureters and the infundibulopelvic ligaments were identified. I excised the fallopian tubes bilaterally. The round ligament on each side was cauterized  and cut. The Vessel Sealer instrument was used for this portion. The round ligaments were identified, cauterized and ligated, a bladder flap was created anteriorly. The uterine vessels were identified and cauterized and then cut.The bladder was pushed out of the operative site and an anterior colpotomy was made. The colpotomy incision was extended circumferentially, following the blue outline of the KOH ring. All pedicles were hemostatic.  The uterus was removed from the vagina with the fallopian tube segments. The vaginal cuff was closed with v-lock suture.  Excellent hemostasis was noted throughout. The pelvis was irrigated. The intraabdominal pressure was lowered assess hemostasis.  At this point I performed cystoscopy. The cystoscopy revealed blue ejection from both ureters. After determining excellent hemostasis, the robot was undocked. The midline fascial incision was closed with 0 vicryl.  The skin from all of the other ports was closed with 3-0 vicryl. 30cc of 0.5% Marcaine was injected into the port sites.  The patient was then extubated and taken to recovery in stable condition.   Sponge, lap and needle counts were correct x 2.  An experienced assistant was required given the standard of surgical care given the complexity of the case.  This assistant was needed for exposure, dissection, suctioning, retraction, instrument exchange  and for overall help during the procedure.  Jacquise Rarick L. Harraway-Smith, M.D., [facility]

## 2021-03-08 NOTE — Discharge Instructions (Signed)
Total Laparoscopic Hysterectomy, Care After The following information offers guidance on how to care for yourself after your procedure. Your health care provider may also give you more specific instructions. If you have problems or questions, contact your health care provider. What can I expect after the procedure? After the procedure, it is common to have:  Pain, bruising, and numbness around your incisions.  Tiredness (fatigue).  Poor appetite.  Less interest in sex.  Vaginal discharge or bleeding. You will need to use a sanitary pad after this procedure.  Feelings of sadness or other emotions. If your ovaries were also removed, it is also common to have symptoms of menopause, such as hot flashes, night sweats, and lack of sleep (insomnia). Follow these instructions at home: Medicines  Take over-the-counter and prescription medicines only as told by your health care provider.  Ask your health care provider if the medicine prescribed to you: ? Requires you to avoid driving or using machinery. ? Can cause constipation. You may need to take these actions to prevent or treat constipation:  Drink enough fluid to keep your urine pale yellow.  Take over-the-counter or prescription medicines.  Eat foods that are high in fiber, such as beans, whole grains, and fresh fruits and vegetables.  Limit foods that are high in fat and processed sugars, such as fried or sweet foods. Incision care  Follow instructions from your health care provider about how to take care of your incisions. Make sure you: ? Wash your hands with soap and water for at least 20 seconds before and after you change your bandage (dressing). If soap and water are not available, use hand sanitizer. ? Change your dressing as told by your health care provider. ? Leave stitches (sutures), skin glue, or adhesive strips in place. These skin closures may need to stay in place for 2 weeks or longer. If adhesive strip edges start  to loosen and curl up, you may trim the loose edges. Do not remove adhesive strips completely unless your health care provider tells you to do that.  Check your incision areas every day for signs of infection. Check for: ? More redness, swelling, or pain. ? Fluid or blood. ? Warmth. ? Pus or a bad smell.   Activity  Rest as told by your health care provider.  Avoid sitting for a long time without moving. Get up to take short walks every 1-2 hours. This is important to improve blood flow and breathing. Ask for help if you feel weak or unsteady.  Return to your normal activities as told by your health care provider. Ask your health care provider what activities are safe for you.  Do not lift anything that is heavier than 10 lb (4.5 kg), or the limit that you are told, for one month after surgery or until your health care provider says that it is safe.  If you were given a sedative during the procedure, it can affect you for several hours. Do not drive or operate machinery until your health care provider says that it is safe.   Lifestyle  Do not use any products that contain nicotine or tobacco. These products include cigarettes, chewing tobacco, and vaping devices, such as e-cigarettes. These can delay healing after surgery. If you need help quitting, ask your health care provider.  Do not drink alcohol until your health care provider approves. General instructions  Do not douche, use tampons, or have sex for at least 6 weeks, or as told by your health   care provider.  If you struggle with physical or emotional changes after your procedure, speak with your health care provider or a therapist.  Do not take baths, swim, or use a hot tub until your health care provider approves. You may only be allowed to take showers for 2-3 weeks.  Keep your dressing dry until your health care provider says it can be removed.  Try to have someone at home with you for the first 1-2 weeks to help with your  daily chores.  Wear compression stockings as told by your health care provider. These stockings help to prevent blood clots and reduce swelling in your legs.  Keep all follow-up visits. This is important.   Contact a health care provider if:  You have any of these signs of infection: ? Chills or a fever. ? More redness, swelling, or pain around an incision. ? Fluid or blood coming from an incision. ? Warmth coming from an incision. ? Pus or a bad smell coming from an incision.  An incision opens.  You feel dizzy or light-headed.  You have pain or bleeding when you urinate, or you are unable to urinate.  You have abnormal vaginal discharge.  You have pain that does not get better with medicine. Get help right away if:  You have a fever and your symptoms suddenly get worse.  You have severe abdominal pain.  You have chest pain or shortness of breath.  You faint.  You have pain, swelling, or redness in your leg.  You have heavy vaginal bleeding with blood clots, soaking through a sanitary pad in less than 1 hour. These symptoms may represent a serious problem that is an emergency. Do not wait to see if the symptoms will go away. Get medical help right away. Call your local emergency services (911 in the [place] Do not drive yourself to the hospital. Summary  After the procedure, it is common to have pain and bruising around your incisions.  Do not take baths, swim, or use a hot tub until your health care provider approves.  Do not lift anything that is heavier than 10 lb (4.5 kg), or the limit that you are told, for one month after surgery or until your health care provider says that it is safe.  Tell your health care provider if you have any signs or symptoms of infection after the procedure.  Get help right away if you have severe abdominal pain, chest pain, shortness of breath, or heavy bleeding from your vagina. This information is not intended to replace advice given  to you by your health care provider. Make sure you discuss any questions you have with your health care provider. Document Revised: 07/23/2020 Document Reviewed: 07/23/2020 Elsevier Patient Education  2021 [employer]  

## 2021-03-08 NOTE — Anesthesia Procedure Notes (Signed)
Procedure Name: Intubation Date/Time: 03/08/2021 7:50 AM Performed by: Mechele Claude, CRNA Pre-anesthesia Checklist: Patient identified, Emergency Drugs available, Suction available and Patient being monitored Patient Re-evaluated:Patient Re-evaluated prior to induction Oxygen Delivery Method: Circle system utilized Preoxygenation: Pre-oxygenation with 100% oxygen Induction Type: IV induction Ventilation: Mask ventilation without difficulty Laryngoscope Size: Mac and 3 Grade View: Grade I Tube type: Oral Tube size: 7.0 mm Number of attempts: 1 Airway Equipment and Method: Stylet and Oral airway Placement Confirmation: ETT inserted through vocal cords under direct vision,  positive ETCO2 and breath sounds checked- equal and bilateral Secured at: 21 cm Tube secured with: Tape Dental Injury: Teeth and Oropharynx as per pre-operative assessment

## 2021-03-08 NOTE — Anesthesia Postprocedure Evaluation (Signed)
Anesthesia Post Note  Patient: Oluwatoyin Baisley  Procedure(s) Performed: XI ROBOTIC ASSISTED TOTAL HYSTERECTOMY WITH SALPINGECTOMY, CYSTOSCOPY (Bilateral Abdomen) REMOVAL OF DRUG DELIVERY IMPLANT (NEXPLANON) FROM  LEFT UPPER ARM (Left Arm Upper)     Patient location during evaluation: PACU Anesthesia Type: General Level of consciousness: awake Pain management: pain level controlled Vital Signs Assessment: post-procedure vital signs reviewed and stable Respiratory status: spontaneous breathing, nonlabored ventilation, respiratory function stable and patient connected to nasal cannula oxygen Cardiovascular status: blood pressure returned to baseline and stable Postop Assessment: no apparent nausea or vomiting Anesthetic complications: no   No complications documented.  Last Vitals:  Vitals:   03/08/21 1115 03/08/21 1156  BP: 120/70 110/66  Pulse: (!) 102 (!) 103  Resp: 18 18  Temp: 36.5 C   SpO2: 100% 100%    Last Pain:  Vitals:   03/08/21 1156  TempSrc: Oral  PainSc:                  Khiyan Crace P Leyla Soliz

## 2021-03-08 NOTE — Brief Op Note (Addendum)
03/08/2021  10:06 AM  PATIENT:  Kerri Harris  31 y.o. female  PRE-OPERATIVE DIAGNOSIS:  AUB  POST-OPERATIVE DIAGNOSIS:  AUB  PROCEDURE:  Procedure(s): XI ROBOTIC ASSISTED TOTAL HYSTERECTOMY WITH SALPINGECTOMY (Bilateral) REMOVAL OF DRUG DELIVERY IMPLANT (NEXPLANON) FROM  LEFT UPPER ARM (Left); cystoscopy  SURGEON:  Surgeon(s) and Role:    * Willodean Rosenthal, MD - Primary    * Constant, Peggy, MD - Assisting  ANESTHESIA:   local and general  EBL:  10 mL   BLOOD ADMINISTERED:none  DRAINS: none   LOCAL MEDICATIONS USED:  MARCAINE     SPECIMEN:  Source of Specimen:  nexplanon; uterus wtih cervix and bilteral fallopian tubes.    DISPOSITION OF SPECIMEN:  PATHOLOGY; the Nexplanon was discarded  COUNTS:  YES  TOURNIQUET:  * No tourniquets in log *  DICTATION: .Note written in EPIC  PLAN OF CARE: Prolonged observation followed by discharge later today.     PATIENT DISPOSITION:  PACU - hemodynamically stable.   Delay start of Pharmacological VTE agent (>24hrs) due to surgical blood loss or risk of bleeding: not applicable  Complications: none immediate.   Kerri Harris, M.D., [facility]

## 2021-03-08 NOTE — Transfer of Care (Signed)
Immediate Anesthesia Transfer of Care Note  Patient: Kerri Harris  Procedure(s) Performed: Procedure(s) (LRB): XI ROBOTIC ASSISTED TOTAL HYSTERECTOMY WITH SALPINGECTOMY (Bilateral) REMOVAL OF DRUG DELIVERY IMPLANT (NEXPLANON) FROM  LEFT UPPER ARM (Left)  Patient Location: PACU  Anesthesia Type: General  Level of Consciousness: awake, alert  and oriented  Airway & Oxygen Therapy: Patient Spontanous Breathing and Patient connected to face mask oxygen  Post-op Assessment: Report given to PACU RN and Post -op Vital signs reviewed and stable  Post vital signs: Reviewed and stable  Complications: No apparent anesthesia complications Last Vitals:  Vitals Value Taken Time  BP 125/51 03/08/21 1004  Temp    Pulse 119 03/08/21 1011  Resp 16 03/08/21 1011  SpO2 100 % 03/08/21 1011  Vitals shown include unvalidated device data.  Last Pain:  Vitals:   03/08/21 0606  TempSrc: Oral  PainSc: 0-No pain      Patients Stated Pain Goal:  (5, per mom. has high pain tolerance) (03/08/21 0606)  Complications: No complications documented.

## 2021-03-08 NOTE — H&P (Signed)
Preoperative History and Physical  Kerri Harris is a 30 y.o. G0P0000 here for surgical management of menorrhagia despite conservative measures.  Pt has  A h/o cerebral palsy and a h/o seizures but, her last seizure was at age 59 years. She is on no meds and is ambulatory at home with a walker. She has contractures and cannot open her thighs very wide.    Proposed surgery: Robot assisted total laparoscopic hysterectomy with bilateral salpingectomy and removal of Nexplanon in left arm   Past Medical History:  Diagnosis Date  . Cerebral palsy (HCC)    legs stiff do not bend  . Eczema    comes and goes per mother  on neck at times uses cocoa butter prn right neck drying out now per mother  . Seizures (HCC)    Childhood seizures - not since age 9  . Uses walker    in home for ambulation, uses wheelchair for distance   Past Surgical History:  Procedure Laterality Date  . feet surgery Bilateral   . HEMORRHOID SURGERY N/A 01/22/2018   Procedure: HEMORRHOIDECTOMY SINGLE COLUMN;  Surgeon: Romie Levee, MD;  Location: [facility];  Service: General;  Laterality: N/A;  . HIP SURGERY Bilateral yrs ago  . KNEE SURGERY Bilateral   . right wrist surgery     cannot bend at wrist   OB History    Gravida  0   Para  0   Term  0   Preterm  0   AB  0   Living  0     SAB  0   IAB  0   Ectopic  0   Multiple  0   Live Births  0          Patient denies any cervical dysplasia or STIs. Medications Prior to Admission  Medication Sig Dispense Refill Last Dose  . Emollient (COCOA BUTTER EX) Apply topically. Prn to eczema   Past Week at Unknown time  . psyllium (METAMUCIL) 58.6 % powder Take 1 packet by mouth every evening.   Past Week at Unknown time  . etonogestrel (NEXPLANON) 68 MG IMPL implant 1 each by Subdermal route once.       Allergies  Allergen Reactions  . Lactose Intolerance (Gi)    Social History:   reports that she has never smoked. She has never used smokeless tobacco.  She reports that she does not drink alcohol and does not use drugs. Family History  Problem Relation Age of Onset  . Arthritis Mother   . Hypertension Maternal Grandmother   . Diabetes Maternal Grandmother     Review of Systems: Noncontributory  PHYSICAL EXAM: Blood pressure (!) 148/84, pulse (!) 118, temperature 99.2 F (37.3 C), temperature source Oral, resp. rate 16, height 4\' 11"  (1.499 m), weight 54 kg, last menstrual period 02/15/2021, SpO2 100 %. General appearance - alert, well appearing, and in no distress Chest - clear to auscultation, no wheezes, rales or rhonchi, symmetric air entry Heart - normal rate and regular rhythm Abdomen - soft, nontender, nondistended, no masses or organomegaly Pelvic - examination not done due to pt intolerance and contractures.  Extremities - peripheral pulses normal, no pedal edema, no clubbing or cyanosis  Labs: Results for orders placed or performed during the hospital encounter of 03/08/21 (from the past 336 hour(s))  ABO/Rh   Collection Time: 03/08/21  5:00 AM  Result Value Ref Range   ABO/RH(D) PENDING   Results for orders placed or performed during the  hospital encounter of 03/04/21 (from the past 336 hour(s))  SARS CORONAVIRUS 2 (TAT 6-24 HRS) Nasopharyngeal Nasopharyngeal Swab   Collection Time: 03/04/21 10:37 AM   Specimen: Nasopharyngeal Swab  Result Value Ref Range   SARS Coronavirus 2 NEGATIVE NEGATIVE  Results for orders placed or performed during the hospital encounter of 03/04/21 (from the past 336 hour(s))  CBC   Collection Time: 03/04/21  9:04 AM  Result Value Ref Range   WBC 6.6 4.0 - 10.5 K/uL   RBC 4.68 3.87 - 5.11 MIL/uL   Hemoglobin 13.1 12.0 - 15.0 g/dL   HCT 57.9 03.8 - 33.3 %   MCV 87.2 80.0 - 100.0 fL   MCH 28.0 26.0 - 34.0 pg   MCHC 32.1 30.0 - 36.0 g/dL   RDW 83.2 91.9 - 16.6 %   Platelets 329 150 - 400 K/uL   nRBC 0.0 0.0 - 0.2 %  hCG, serum, qualitative   Collection Time: 03/04/21  9:04 AM  Result  Value Ref Range   Preg, Serum NEGATIVE NEGATIVE  Type and screen [facility]   Collection Time: 03/04/21  9:04 AM  Result Value Ref Range   ABO/RH(D) A POS    Antibody Screen NEG    Sample Expiration 03/18/2021,2359    Extend sample reason      NO TRANSFUSIONS OR PREGNANCY IN THE PAST 3 MONTHS Performed at [facility], [address], [place], [place] [address]     Imaging Studies: 12/29/2020 CLINICAL DATA:  Dysfunctional uterine bleeding, history cerebral palsy; LMP approximately 12/21/2020  EXAM: TRANSABDOMINAL ULTRASOUND OF PELVIS  TECHNIQUE: Transabdominal ultrasound examination of the pelvis was performed including evaluation of the uterus, ovaries, adnexal regions, and pelvic cul-de-sac. Patient unable to undergo transvaginal imaging.  COMPARISON:  None  FINDINGS: Uterus  Measurements: 7.5 x 3.4 x 4.4 cm = volume: 58 mL. Anteverted. Normal morphology without mass  Endometrium  Thickness: 4 mm.  No endometrial fluid or focal abnormality  Right ovary  Not visualized, likely obscured by bowel  Left ovary  Measurements: 4.7 x 3.1 x 3.8 cm = volume: 28.5 mL. Complex cystic lesion within LEFT adnexa, 3.6 x 3.0 x 2.7 cm, containing scattered internal echogenicity and partial septation, could potentially represent a hemorrhagic cyst.  Other findings: Small amount of free pelvic fluid. No additional adnexal masses.  IMPRESSION: Unremarkable uterus and endometrial complex.  Nonvisualization of RIGHT ovary.  Complex cystic lesion LEFT adnexa 3.6 cm greatest size, a probably benign cyst question hemorrhagic cyst; short-term follow-up ultrasound recommended in 6-12 weeks. Assessment: Patient Active Problem List   Diagnosis Date Noted  . Post-operative state 03/08/2021  . Menorrhagia with regular cycle 03/08/2021  . Implanon in place 11/17/2020  . Breakthrough bleeding on Implanon 11/17/2020  . Rocker bottom  foot deformity 09/08/2019  . Bilateral swelling of feet 09/02/2019  . Foot pain, bilateral 09/02/2019  . Atopic dermatitis 11/14/2017  . Encounter for general adult medical examination with abnormal findings 08/07/2017  . Cerebral palsy (HCC) 07/20/2016  . Needs assistance while at home 06/11/2015    Plan: Patient will undergo surgical management withRobot assisted total laparoscopic hysterectomy with bilateral salpingectomy and removal of Nexplanon in left arm.   The risks of surgery were discussed in detail with the patient including but not limited to: bleeding which may require transfusion or reoperation; infection which may require antibiotics; injury to surrounding organs which may involve bowel, bladder, ureters ; need for additional procedures including laparoscopy or laparotomy; thromboembolic  phenomenon, surgical site problems and other postoperative/anesthesia complications. Likelihood of success in alleviating the patient's condition was discussed. Routine postoperative instructions will be reviewed with the patient and her family in detail after surgery.  The patient concurred with the proposed plan, giving informed written consent for the surgery.  Patient has been NPO since last night she will remain NPO for procedure.  Anesthesia and OR aware.  Preoperative prophylactic antibiotics and SCDs ordered on call to the OR.  To OR when ready.  Brailen Macneal L. Erin Fulling, M.D., [facility] 03/08/2021 7:26 AM

## 2021-03-10 ENCOUNTER — Encounter: Payer: Self-pay | Admitting: General Practice

## 2021-03-10 ENCOUNTER — Telehealth: Payer: Self-pay

## 2021-03-10 ENCOUNTER — Telehealth: Payer: Self-pay | Admitting: Obstetrics & Gynecology

## 2021-03-10 ENCOUNTER — Encounter (HOSPITAL_BASED_OUTPATIENT_CLINIC_OR_DEPARTMENT_OTHER): Payer: Self-pay | Admitting: Obstetrics & Gynecology

## 2021-03-10 LAB — SURGICAL PATHOLOGY

## 2021-03-10 NOTE — Telephone Encounter (Signed)
Work note complete for patient. Armandina Stammer RN

## 2021-03-10 NOTE — Telephone Encounter (Signed)
TC to pt and her mother. Pt has had some pain but, she is taking the meds. She is able to get up but, is slower. She is eating and having stools. Pt has no nausea or vomiting. No fevers. The ports sites look good.   All questions answered.   Gaby Harney L. Harraway-Smith, M.D., [facility]

## 2021-03-23 ENCOUNTER — Ambulatory Visit: Payer: Managed Care, Other (non HMO) | Admitting: Obstetrics & Gynecology

## 2021-03-23 ENCOUNTER — Encounter: Payer: Self-pay | Admitting: Obstetrics & Gynecology

## 2021-03-23 ENCOUNTER — Other Ambulatory Visit: Payer: Self-pay

## 2021-03-23 VITALS — BP 123/72 | HR 88

## 2021-03-23 DIAGNOSIS — Z9889 Other specified postprocedural states: Secondary | ICD-10-CM

## 2021-03-23 NOTE — Progress Notes (Signed)
History:  30 y.o.LMP here today for 2 week post op check.Pt is s/p RATH with bilateral salpingectomy on 03/08/2021.  Pt reports that she is doing well. She is eating and passing stools without difficulty.   She has been able to ambulate without difficulty. She is moving more slowly and has to have more assistance with changing postions but, has adequate help at home with her mother and brother.    The following portions of the patient's history were reviewed and updated as appropriate: allergies, current medications, past family history, past medical history, past social history, past surgical history and problem list.  Review of Systems:  Pertinent items are noted in HPI.    Objective:  Physical Exam BP 123/72   Pulse 88   LMP 02/15/2021   CONSTITUTIONAL: Well-developed, well-nourished female in no acute distress.  HENT:  Normocephalic, atraumatic EYES: Conjunctivae and EOM are normal. No scleral icterus.  NECK: Normal range of motion SKIN: Skin is warm and dry. No rash noted. Not diaphoretic.No pallor. NEUROLGIC: Alert and oriented to person, place, and time. Normal coordination.  Abd: Soft, nontender and nondistended; her port sites are healing well. Glue still in place. No s/sx of infxn.  Pelvic: deferred  Labs and Imaging Surg path 03/08/2021 UTERUS, CERVIX AND BILATERAL FALLOPIAN TUBES, HYSTERECTOMY AND  BILATERAL SALPINGECTOMY:  Cervix:  - Mild acute and chronic cervicitis.   Uterus:  - Endometrium: Inactive endometrium.  - Myometrium: No significant histopathologic findings.  - Serosa: No significant histopathologic findings.   Adnexa:  - Fallopian tubes: No significant histopathologic findings.    Assessment & Plan:  2 week post op check following RATH with bilateral salpingectomy.   Doing well  Reviewed her surg path.   Reviewed post op instructions and activities  Gradual increase in activities  F/u in 4 weeks or sooner prn  All questions answered.   Breanna Mcdaniel L.  Harraway-Smith, M.D., [facility]

## 2021-03-25 LAB — HM PAP SMEAR

## 2021-04-07 ENCOUNTER — Encounter: Payer: Self-pay | Admitting: *Deleted

## 2021-04-21 ENCOUNTER — Encounter: Payer: Managed Care, Other (non HMO) | Admitting: Obstetrics & Gynecology

## 2021-05-25 ENCOUNTER — Other Ambulatory Visit: Payer: Self-pay

## 2021-05-25 ENCOUNTER — Encounter: Payer: Self-pay | Admitting: Obstetrics & Gynecology

## 2021-05-25 ENCOUNTER — Ambulatory Visit (INDEPENDENT_AMBULATORY_CARE_PROVIDER_SITE_OTHER): Payer: Managed Care, Other (non HMO) | Admitting: Obstetrics & Gynecology

## 2021-05-25 VITALS — BP 126/66 | HR 86

## 2021-05-25 DIAGNOSIS — Z9889 Other specified postprocedural states: Secondary | ICD-10-CM

## 2021-05-25 NOTE — Progress Notes (Signed)
History:  30 y.o.LMP here today for 2 week post op check.Pt is s/p RATH with bilateral salpingectomy on 03/08/2021.  Pt reports that she is doing well. She is eating and passing stools without difficulty.   Per pts mother, pt is back to her usual state of health. She reports that managing her care is so much easier.   The following portions of the patient's history were reviewed and updated as appropriate: allergies, current medications, past family history, past medical history, past social history, past surgical history and problem list.  Review of Systems:  Pertinent items are noted in HPI.    Objective:  Physical Exam BP 126/66   Pulse 86   LMP 02/15/2021   CONSTITUTIONAL: Well-developed, well-nourished female in no acute distress.  HENT:  Normocephalic, atraumatic EYES: Conjunctivae and EOM are normal. No scleral icterus.  NECK: Normal range of motion SKIN: Skin is warm and dry. No rash noted. Not diaphoretic.No pallor. NEUROLGIC: Alert and oriented to person, place, and time. Normal coordination.  Abd: Soft, nontender and nondistended; her port sites are healing well. .  Pelvic: deferred  Labs and Imaging Surg path 03/08/2021 UTERUS, CERVIX AND BILATERAL FALLOPIAN TUBES, HYSTERECTOMY AND  BILATERAL SALPINGECTOMY:  Cervix:  - Mild acute and chronic cervicitis.   Uterus:  - Endometrium: Inactive endometrium.  - Myometrium: No significant histopathologic findings.  - Serosa: No significant histopathologic findings.   Adnexa:  - Fallopian tubes: No significant histopathologic findings.    Assessment & Plan:  11+ weeks post op check following RATH with bilateral salpingectomy.  Pt missed prev visits as the entire  family developed COVID iist   Doing well  Reviewed her surg path.   Gradual increase in activities  F/u in 1 year or sooner prn  Reviewed female health maintenance going forward   All questions answered.   Shafter Jupin L. Erin Fulling, M.D., [facility] 4/5/

## 2021-10-07 ENCOUNTER — Telehealth: Payer: Managed Care, Other (non HMO) | Admitting: Physician Assistant

## 2021-10-07 DIAGNOSIS — R6889 Other general symptoms and signs: Secondary | ICD-10-CM

## 2021-10-07 MED ORDER — OSELTAMIVIR PHOSPHATE 75 MG PO CAPS: 75.0000 mg | ORAL_CAPSULE | Freq: Two times a day (BID) | ORAL | 0 refills | Status: DC

## 2021-10-07 NOTE — Progress Notes (Signed)
Virtual Visit Consent   Sekai Nayak, you are scheduled for a virtual visit with a [facility] provider today.     Just as with appointments in the office, your consent must be obtained to participate.  Your consent will be active for this visit and any virtual visit you may have with one of our providers in the next 365 days.     If you have a MyChart account, a copy of this consent can be sent to you electronically.  All virtual visits are billed to your insurance company just like a traditional visit in the office.    As this is a virtual visit, video technology does not allow for your provider to perform a traditional examination.  This may limit your provider's ability to fully assess your condition.  If your provider identifies any concerns that need to be evaluated in person or the need to arrange testing (such as labs, EKG, etc.), we will make arrangements to do so.     Although advances in technology are sophisticated, we cannot ensure that it will always work on either your end or our end.  If the connection with a video visit is poor, the visit may have to be switched to a telephone visit.  With either a video or telephone visit, we are not always able to ensure that we have a secure connection.     I need to obtain your verbal consent now.   Are you willing to proceed with your visit today?    Kayler Buckholtz has provided verbal consent on 10/07/2021 for a virtual visit (video or telephone).   Kerri Harris, [place]   Date: 10/07/2021 2:58 PM   Virtual Visit via Video Note   I, Kerri Harris, connected with  Bethann Qualley  ([id number], May 17, 1991) on 10/07/21 at  2:45 PM EDT by a video-enabled telemedicine application and verified that I am speaking with the correct person using two identifiers.  Location: Patient: Virtual Visit Location Patient: Home Provider: Virtual Visit Location Provider: Home Office   I discussed the limitations of evaluation and management by  telemedicine and the availability of in person appointments. The patient expressed understanding and agreed to proceed.    History of Present Illness: Emari Hreha is a 30 y.o. who identifies as a female who was assigned female at birth, and is being seen today along with her mother/guardian for abrupt onset of heat intolerance, sore throat, nasal/head congestion with fatigue. Also with cough that is dry at present. Mother denies any recent travel. Took home COVID test which was negative. IS concerned about influenza giving abrupt symptoms.   HPI: HPI  Problems:  Patient Active Problem List   Diagnosis Date Noted   Post-operative state 03/08/2021   Menorrhagia with regular cycle 03/08/2021   Abnormal uterine bleeding (AUB)    Implanon in place 11/17/2020   Breakthrough bleeding on Implanon 11/17/2020   Rocker bottom foot deformity 09/08/2019   Bilateral swelling of feet 09/02/2019   Foot pain, bilateral 09/02/2019   Atopic dermatitis 11/14/2017   Encounter for general adult medical examination with abnormal findings 08/07/2017   Cerebral palsy (HCC) 07/20/2016   Needs assistance while at home 06/11/2015    Allergies:  Allergies  Allergen Reactions   Lactose Intolerance (Gi)    Medications:  Current Outpatient Medications:    oseltamivir (TAMIFLU) 75 MG capsule, Take 1 capsule (75 mg total) by mouth 2 (two) times daily., Disp: 10 capsule, Rfl: 0   Emollient (COCOA  BUTTER EX), Apply topically. Prn to eczema, Disp: , Rfl:    psyllium (METAMUCIL) 58.6 % powder, Take 1 packet by mouth every evening., Disp: , Rfl:   Observations/Objective: Patient is well-developed, well-nourished in no acute distress.  Resting comfortably on couch at home.  Head is normocephalic, atraumatic.  No labored breathing. Speech is clear and coherent with logical content.  Patient is alert and oriented at baseline.   Assessment and Plan: 1. Flu-like symptoms - oseltamivir (TAMIFLU) 75 MG capsule; Take  1 capsule (75 mg total) by mouth 2 (two) times daily.  Dispense: 10 capsule; Refill: 0 Have not checked temperature but patient complaining of subjective fevers along with abrupt onset of sore throat, PND, head congestion and cough. COVID negative. Will treat empirically for influenza giving high rates and her symptomology. Rx Tamfilu. Supportive measures and OTC medications reviewed with guardian. .  Follow Up Instructions: I discussed the assessment and treatment plan with the patient. The patient was provided an opportunity to ask questions and all were answered. The patient agreed with the plan and demonstrated an understanding of the instructions.  A copy of instructions were sent to the patient via MyChart unless otherwise noted below.   The patient was advised to call back or seek an in-person evaluation if the symptoms worsen or if the condition fails to improve as anticipated.  Time:  I spent 12 minutes with the patient via telehealth technology discussing the above problems/concerns.    Kerri Climes, PA-C

## 2021-10-07 NOTE — Patient Instructions (Signed)
Grier Mitts, thank you for joining Piedad Climes, PA-C for today's virtual visit.  While this provider is not your primary care provider (PCP), if your PCP is located in our provider database this encounter information will be shared with them immediately following your visit.  Consent: (Patient) Kerri Harris provided verbal consent for this virtual visit at the beginning of the encounter.  Current Medications:  Current Outpatient Medications:    oseltamivir (TAMIFLU) 75 MG capsule, Take 1 capsule (75 mg total) by mouth 2 (two) times daily., Disp: 10 capsule, Rfl: 0   Emollient (COCOA BUTTER EX), Apply topically. Prn to eczema, Disp: , Rfl:    psyllium (METAMUCIL) 58.6 % powder, Take 1 packet by mouth every evening., Disp: , Rfl:    Medications ordered in this encounter:  Meds ordered this encounter  Medications   oseltamivir (TAMIFLU) 75 MG capsule    Sig: Take 1 capsule (75 mg total) by mouth 2 (two) times daily.    Dispense:  10 capsule    Refill:  0    Order Specific Question:   Supervising Provider    Answer:   Hyacinth Meeker, BRIAN [[id number]]     *If you need refills on other medications prior to your next appointment, please contact your pharmacy*  Follow-Up: Call back or seek an in-person evaluation if the symptoms worsen or if the condition fails to improve as anticipated.  Other Instructions    We are sorry that you are not feeling well.  Here is how we plan to help! Based on what you have shared with me it looks like you may have a respiratory virus that may be influenza.  Influenza or "the flu" is   an infection caused by a respiratory virus. The flu virus is highly contagious and persons who did not receive their yearly flu vaccination may "catch" the flu from close contact.  We have anti-viral medications to treat the viruses that cause this infection. They are not a "cure" and only shorten the course of the infection. These prescriptions are most effective when  they are given within the first 2 days of "flu" symptoms. Antiviral medication are indicated if you have a high risk of complications from the flu. You should  also consider an antiviral medication if you are in close contact with someone who is at risk. These medications can help patients avoid complications from the flu  but have side effects that you should know. Possible side effects from Tamiflu or oseltamivir include nausea, vomiting, diarrhea, dizziness, headaches, eye redness, sleep problems or other respiratory symptoms. You should not take Tamiflu if you have an allergy to oseltamivir or any to the ingredients in Tamiflu.  Based upon your symptoms and potential risk factors I have prescribed Oseltamivir (Tamiflu).  It has been sent to your designated pharmacy.  You will take one 75 mg capsule orally twice a day for the next 5 days.  ANYONE WHO HAS FLU SYMPTOMS SHOULD: Stay home. The flu is highly contagious and going out or to work exposes others! Be sure to drink plenty of fluids. Water is fine as well as fruit juices, sodas and electrolyte beverages. You may want to stay away from caffeine or alcohol. If you are nauseated, try taking small sips of liquids. How do you know if you are getting enough fluid? Your urine should be a pale yellow or almost colorless. Get rest. Taking a steamy shower or using a humidifier may help nasal congestion and ease sore throat pain.  Using a saline nasal spray works much the same way. Cough drops, hard candies and sore throat lozenges may ease your cough. Line up a caregiver. Have someone check on you regularly.   GET HELP RIGHT AWAY IF: You cannot keep down liquids or your medications. You become short of breath Your fell like you are going to pass out or loose consciousness. Your symptoms persist after you have completed your treatment plan MAKE SURE YOU  Understand these instructions. Will watch your condition. Will get help right away if you are  not doing well or get worse.      If you have been instructed to have an in-person evaluation today at a local Urgent Care facility, please use the link below. It will take you to a list of all of our available [facility], including address, phone number and hours of operation. Please do not delay care.  [facility]  If you or a family member do not have a primary care provider, use the link below to schedule a visit and establish care. When you choose a [facility] primary care physician or advanced practice provider, you gain a long-term partner in health. Find a Primary Care Provider  Learn more about [facility] in-office and virtual care options:  - Get Care Now

## 2022-01-11 ENCOUNTER — Ambulatory Visit: Payer: Managed Care, Other (non HMO)

## 2022-01-23 ENCOUNTER — Ambulatory Visit (INDEPENDENT_AMBULATORY_CARE_PROVIDER_SITE_OTHER): Payer: Managed Care, Other (non HMO)

## 2022-01-23 ENCOUNTER — Other Ambulatory Visit: Payer: Self-pay

## 2022-01-23 VITALS — BP 133/93 | HR 109 | Wt 112.3 lb

## 2022-01-23 DIAGNOSIS — R300 Dysuria: Secondary | ICD-10-CM

## 2022-01-23 LAB — POCT URINALYSIS DIP (DEVICE)
Bilirubin Urine: NEGATIVE
Glucose, UA: NEGATIVE mg/dL
Hgb urine dipstick: NEGATIVE
Ketones, ur: NEGATIVE mg/dL
Leukocytes,Ua: NEGATIVE
Nitrite: NEGATIVE
Protein, ur: NEGATIVE mg/dL
Specific Gravity, Urine: <=1.005 (ref 1.005–1.030)
Urobilinogen, UA: 0.2 mg/dL (ref 0.0–1.0)
pH: 5 (ref 5.0–8.0)

## 2022-01-23 NOTE — Progress Notes (Signed)
Here today for possible UTI. Reports burning with urination and incomplete emptying of bladder. Pt unable to void. Given 2 cups of water with no change. Pt to take specimen container home. Instructions give for clean catch urine. Will return specimen to office immediately.  Specimen returned to office. UA is unremarkable. Reviewed with Marvetta Gibbons, NP who recommends urine culture due to symptoms. Also recommends vaginal swab as this could also be symptoms of vaginal infection. Called pt to review; spoke with pt's mother. Explained UA was normal. Urine will be sent for culture. Recommended patient trial Monistat. If no improvement and urine culture is negative, pt should return for vaginal swab and possible provider visit. Pt's mother verbalizes understanding.  Fleet Contras RN 01/23/22

## 2022-01-26 LAB — URINE CULTURE

## 2022-07-03 ENCOUNTER — Ambulatory Visit (INDEPENDENT_AMBULATORY_CARE_PROVIDER_SITE_OTHER): Payer: Managed Care, Other (non HMO) | Admitting: Internal Medicine

## 2022-07-03 VITALS — BP 122/70 | HR 90 | Temp 98.2°F | Ht 59.0 in

## 2022-07-03 DIAGNOSIS — M79671 Pain in right foot: Secondary | ICD-10-CM

## 2022-07-03 DIAGNOSIS — Q6680 Congenital vertical talus deformity, unspecified foot: Secondary | ICD-10-CM

## 2022-07-03 DIAGNOSIS — M79672 Pain in left foot: Secondary | ICD-10-CM | POA: Diagnosis not present

## 2022-07-03 NOTE — Progress Notes (Unsigned)
   Subjective:  Patient ID: Kerri Harris, female    DOB: 1991-03-21  Age: 31 y.o. MRN: [id number]  CC: Follow-up   HPI Kandee Escalante presents for f/up -  She is with her mother today who complains that she is having more difficulty using hers legs with pregressive weakness.  Outpatient Medications Prior to Visit  Medication Sig Dispense Refill   Emollient (COCOA BUTTER EX) Apply topically. Prn to eczema     psyllium (METAMUCIL) 58.6 % powder Take 1 packet by mouth every evening.     No facility-administered medications prior to visit.    ROS Review of Systems  Objective:  BP 122/70 (BP Location: Right Arm, Patient Position: Sitting, Cuff Size: Large)   Pulse 90   Temp 98.2 F (36.8 C) (Oral)   Ht 4\' 11"  (1.499 m)   LMP 02/15/2021   SpO2 99%   BMI 22.68 kg/m   BP Readings from Last 3 Encounters:  07/03/22 122/70  01/23/22 (!) 133/93  05/25/21 126/66    Wt Readings from Last 3 Encounters:  01/23/22 112 lb 4.8 oz (50.9 kg)  03/08/21 119 lb (54 kg)  03/04/21 102 lb (46.3 kg)    Physical Exam  Lab Results  Component Value Date   WBC 6.6 03/04/2021   HGB 13.1 03/04/2021   HCT 40.8 03/04/2021   PLT 329 03/04/2021   GLUCOSE 95 09/02/2019   CHOL 139 08/07/2017   TRIG 148.0 08/07/2017   HDL 49.00 08/07/2017   LDLCALC 61 08/07/2017   ALT 10 08/22/2019   AST 13 08/22/2019   NA 139 09/02/2019   K 3.6 09/02/2019   CL 105 09/02/2019   CREATININE 0.74 09/02/2019   BUN 15 09/02/2019   CO2 23 09/02/2019   TSH 1.69 06/11/2015   HGBA1C 5.3 11/14/2017    No results found.  Assessment & Plan:   Makynlee was seen today for follow-up.  Diagnoses and all orders for this visit:  Foot pain, bilateral -     Ambulatory referral to Physical Medicine Rehab  Rocker bottom foot deformity -     Ambulatory referral to Physical Medicine Rehab   I am having Morrie Sheldon Burkhammer maintain her psyllium and Emollient (COCOA BUTTER EX).  No orders of the defined types were  placed in this encounter.    Follow-up: No follow-ups on file.  Morrie Sheldon, MD

## 2022-07-04 ENCOUNTER — Encounter: Payer: Self-pay | Admitting: Internal Medicine

## 2022-07-10 ENCOUNTER — Encounter: Payer: Self-pay | Admitting: Physical Medicine and Rehabilitation

## 2022-11-03 ENCOUNTER — Ambulatory Visit: Payer: Managed Care, Other (non HMO) | Admitting: Physical Medicine and Rehabilitation

## 2022-11-17 ENCOUNTER — Ambulatory Visit: Payer: Managed Care, Other (non HMO) | Admitting: Physical Medicine and Rehabilitation

## 2023-01-22 ENCOUNTER — Encounter: Payer: Managed Care, Other (non HMO) | Admitting: Physical Medicine and Rehabilitation

## 2023-02-28 ENCOUNTER — Encounter
Payer: Managed Care, Other (non HMO) | Attending: Physical Medicine and Rehabilitation | Admitting: Physical Medicine and Rehabilitation

## 2023-02-28 ENCOUNTER — Encounter: Payer: Self-pay | Admitting: Physical Medicine and Rehabilitation

## 2023-02-28 VITALS — BP 149/74 | HR 111 | Ht 59.0 in | Wt 102.0 lb

## 2023-02-28 DIAGNOSIS — Z7409 Other reduced mobility: Secondary | ICD-10-CM | POA: Insufficient documentation

## 2023-02-28 DIAGNOSIS — R252 Cramp and spasm: Secondary | ICD-10-CM | POA: Diagnosis not present

## 2023-02-28 DIAGNOSIS — Z789 Other specified health status: Secondary | ICD-10-CM | POA: Diagnosis present

## 2023-02-28 DIAGNOSIS — G801 Spastic diplegic cerebral palsy: Secondary | ICD-10-CM | POA: Diagnosis not present

## 2023-02-28 DIAGNOSIS — G4709 Other insomnia: Secondary | ICD-10-CM

## 2023-02-28 NOTE — Progress Notes (Signed)
Subjective:    Patient ID: Kerri Harris, female    DOB: 1991/06/13, 32 y.o.   MRN: [id number]  HPI  HPI  Kerri Harris is a 32 y.o. year old female  who  has a past medical history of Cerebral palsy ([place]), Eczema, Seizures ([place]), and Uses walker.   They are presenting to PM&R clinic as a new patient for pain management evaluation. They were referred by Dr. Ronnald Ramp for treatment of bilateral foot pain. Per his last note, progressive weakness in bilateral legs also.   Patient seen and evaluated with mother in room, who provides most of history given patient cognitive deficits.  She states that the patient has sensory deficits in her bilateral lower extremities, has a high pain tolerance, therefore does not feel diffuse pain.  However, have noticed increasing cramping and spasming, and tightness in the bilateral legs which have been interfering with use of her walker functionally in the home and with hygiene/cares by her mother.  Usually, she uses a walker at home and a wheelchair in the community, but has had difficulty doing this because of bilateral lower extremity spasticity.  She notes that her chair was obtained by [employer] greater than 5 years prior.  It is breaking down in several places, most specifically has foot straps which no longer function and are difficult to utilize for patient positioning.  They have attempted to contact [employer] multiple times regarding repair of the chair, without success.  She has been on baclofen for muscle spasms, unsure of dose, did not see a benefit so did not continue it.  Not currently receiving Botox for lower extremity spasticity.  She does have chronic constipation, will take psyllium laxative over-the-counter and increase dosing to titrate stools once to twice per week.  Mom denies worsening spasticity depending on time of day, positioning, but does note that the patient has chronic insomnia.  This has been the case since she was little, and the  patient is functional without regular sleep, no ongoing concerns regarding insomnia.   Pain Inventory Average Pain unable to relate pain scale, does not feel pain - or has really high tolerance Pain Right Now unable to relate pain scale, does not feel pain - or has really high tolerance My pain is unable to relate pain scale, does not feel pain - or has really high tolerance  LOCATION OF PAIN  generalized all over stiffness and has flat feet  BOWEL Number of stools per week: 1 Oral laxative use Yes  Type of laxative psyllium  BLADDER Normal  Mobility walk with assistance use a walker ability to climb steps?  no do you drive?  no use a wheelchair  Function disabled: date disabled 07-12-1990 I need assistance with the following:  meal prep, household duties, and shopping  Neuro/Psych spasms  Prior Studies Any changes since last visit?  no  Physicians involved in your care Any changes since last visit?  no   Family History  Problem Relation Age of Onset   Arthritis Mother    Hypertension Maternal Grandmother    Diabetes Maternal Grandmother    Social History   Socioeconomic History   Marital status: Single    Spouse name: Not on file   Number of children: 0   Years of education: 12   Highest education level: Not on file  Occupational History   Not on file  Tobacco Use   Smoking status: Never   Smokeless tobacco: Never  Vaping Use   Vaping  Use: Never used  Substance and Sexual Activity   Alcohol use: No    Alcohol/week: 0.0 standard drinks of alcohol   Drug use: No   Sexual activity: Never  Other Topics Concern   Not on file  Social History Narrative   Fun: Play and exercise, read   Denies religious beliefs effecting health care.    Social Determinants of Health   Financial Resource Strain: Not on file  Food Insecurity: No Food Insecurity (11/17/2020)   Hunger Vital Sign    Worried About Running Out of Food in the Last Year: Never true    Ran Out  of Food in the Last Year: Never true  Transportation Needs: No Transportation Needs (11/17/2020)   PRAPARE - Hydrologist (Medical): No    Lack of Transportation (Non-Medical): No  Physical Activity: Not on file  Stress: Not on file  Social Connections: Not on file   Past Surgical History:  Procedure Laterality Date   feet surgery Bilateral    HEMORRHOID SURGERY N/A 01/22/2018   Procedure: HEMORRHOIDECTOMY SINGLE COLUMN;  Surgeon: Leighton Ruff, MD;  Location: [facility];  Service: General;  Laterality: N/A;   HIP SURGERY Bilateral yrs ago   KNEE SURGERY Bilateral    REMOVAL OF DRUG DELIVERY IMPLANT Left 03/08/2021   Procedure: REMOVAL OF DRUG DELIVERY IMPLANT (NEXPLANON) FROM  LEFT UPPER ARM;  Surgeon: Lavonia Drafts, MD;  Location: [facility];  Service: Gynecology;  Laterality: Left;   right wrist surgery     cannot bend at wrist   ROBOTIC ASSISTED TOTAL HYSTERECTOMY Bilateral 03/08/2021   Procedure: XI ROBOTIC ASSISTED TOTAL HYSTERECTOMY WITH SALPINGECTOMY, CYSTOSCOPY;  Surgeon: Lavonia Drafts, MD;  Location: [facility];  Service: Gynecology;  Laterality: Bilateral;   Past Medical History:  Diagnosis Date   Cerebral palsy ([facility])    legs stiff do not bend   Eczema    comes and goes per mother  on neck at times uses cocoa butter prn right neck drying out now per mother   Seizures ([place])    Childhood seizures - not since age 89   Uses walker    in home for ambulation, uses wheelchair for distance   BP (!) 149/74   Pulse (!) 111   Ht 4\' 11"  (1.499 m)   Wt 102 lb (46.3 kg) Comment: reported  LMP 02/15/2021   SpO2 98%   BMI 20.60 kg/m   Opioid Risk Score:   Fall Risk Score:  `1  Depression screen [facility] 2/9     02/28/2023    1:02 PM 07/03/2022    1:43 PM 11/17/2020    5:12 PM 09/03/2019    8:18 AM 08/07/2017    9:10 AM  Depression screen PHQ 2/9  Decreased Interest 1 0 0 0 0  Down, Depressed, Hopeless 0 0  0 0 0  PHQ - 2 Score 1 0 0 0 0  Altered sleeping 2  0    Tired, decreased energy 0  0    Change in appetite 0  0    Feeling bad or failure about yourself  0  0    Trouble concentrating 0  0    Moving slowly or fidgety/restless 0  0    Suicidal thoughts 0  0    PHQ-9 Score 3  0      Review of Systems  Constitutional: Negative.   HENT: Negative.    Eyes: Negative.   Respiratory: Negative.  Cardiovascular: Negative.   Gastrointestinal: Negative.   Endocrine: Negative.   Genitourinary: Negative.   Musculoskeletal:  Positive for gait problem.       Spasticity  Skin: Negative.   Allergic/Immunologic: Negative.   Hematological: Negative.   Psychiatric/Behavioral: Negative.    All other systems reviewed and are negative.      Objective:   Physical Exam   PE: Constitution: Appropriate appearance for age. No apparent distress  Resp: No respiratory distress. No accessory muscle usage. on RA and CTAB Cardio: Well perfused appearance. No peripheral edema. Abdomen: Nondistended. Nontender.   Psych: Appropriate mood and affect. Neuro: AAOx4. Mild cognitive deficits, mostly in delayed responses; appropriate content.   Neurologic Exam:   + Bilateral rocker bottom feet DTRs: Reflexes were 1+ in bilateral achilles, patella, biceps, BR and triceps. Babinsky: flexor responses b/l.   Hoffmans: negative b/l Sensory exam: revealed  with reduced sensation to light touch in bilateral lower extremities; no apparent dermatomal distribution.  Motor exam: strength 5/5 throughout bilateral upper extremities and with exception of 2+/5 strength RLE hip flexion, knee extension, plantarflexion; 2-/5 LLE hip flexion, knee extension, plantarflexion; 1/5 active dorsiflexion b/l  Coordination: Fine motor coordination in BL UE WNL Gait: Wheelchair with velcroe foot positioners; significant wear on rim, axels, and body noted; significant effort required to position correctly.  Tone: MAS 3 BL knee  extensors, adductors; MAS 2 bilateral plantarflexors (gastroc > soleus)       Assessment & Plan:   Arliene Tomek is a 32 y.o. year old female  who  has a past medical history of Cerebral palsy ([place]), Eczema, Seizures ([place]), and Uses walker.   They are presenting to PM&R clinic as a new patient for treatment of bilateral lower extremity spasticity/discomfort .    Spastic diplegic cerebral palsy (HCC) Assessment & Plan: I will have you follow-up with me in a couple of weeks for Botox injections into the bilateral lower extremities.  The goal of this is to improve your mobility with your walker and improve the ability to clean and provide cares.  After this appointment, I will send a referral to physical therapy to hopefully get you moving within 2 to 4 weeks of injections.  I will then follow-up with you again in 6 to 8 weeks after injections.  In the interim, I gave you a prescription for baclofen as needed for severe spasms.  Monitor for worsening constipation with this medication.    Spasticity Assessment & Plan: Plan for botox 300 U per leg Targetting b/l quadriceps, adductors, and plantarflexors For positioning, mobility, caregiver burden and hygiene   Impaired mobility and ADLs Assessment & Plan: Will give a call to [employer] to look into repairing your wheelchair, visit positioning straps for your feet specifically.    Other insomnia Assessment & Plan: Chronic, refusing treatment at this time   Other orders -     Baclofen; Take 1 tablet (10 mg total) by mouth 3 (three) times daily as needed for muscle spasms.  Dispense: 90 each; Refill: 2

## 2023-02-28 NOTE — Patient Instructions (Addendum)
Will give a call to [employer] to look into repairing your wheelchair, visit positioning straps for your feet specifically.  I will have you follow-up with me in a couple of weeks for Botox injections into the bilateral lower extremities.  The goal of this is to improve your mobility with your walker and improve the ability to clean and provide cares.  After this appointment, I will send a referral to physical therapy to hopefully get you moving within 2 to 4 weeks of injections.  I will then follow-up with you again in 6 to 8 weeks after injections.  In the interim, I gave you a prescription for baclofen as needed for severe spasms.  Monitor for worsening constipation with this medication.

## 2023-02-28 NOTE — Progress Notes (Deleted)
Subjective:    Patient ID: Kerri Harris, female    DOB: [birth year], 32 y.o.   MRN: [id number]  HPI   Pain Inventory Average Pain {NUMBERS; 0-10:5044} Pain Right Now {NUMBERS; 0-10:5044} My pain is {PAIN DESCRIPTION:21022940}  In the last 24 hours, has pain interfered with the following? General activity {NUMBERS; 0-10:5044} Relation with others {NUMBERS; 0-10:5044} Enjoyment of life {NUMBERS; 0-10:5044} What TIME of day is your pain at its worst? {time of day:24191} Sleep (in general) {BHH GOOD/FAIR/POOR:22877}  Pain is worse with: {ACTIVITIES:21022942} Pain improves with: {PAIN IMPROVES SV:5789238 Relief from Meds: {NUMBERS; 0-10:5044}  {MOBILITY IM:9870394  {FUNCTION:21022946}  {NEURO/PSYCH:21022948}  {CPRM PRIOR STUDIES:21022953}  {CPRM PHYSICIANS INVOLVED IN YOUR CARE:21022954}    Family History  Problem Relation Age of Onset   Arthritis Mother    Hypertension Maternal Grandmother    Diabetes Maternal Grandmother    Social History   Socioeconomic History   Marital status: Single    Spouse name: Not on file   Number of children: 0   Years of education: 12   Highest education level: Not on file  Occupational History   Not on file  Tobacco Use   Smoking status: Never   Smokeless tobacco: Never  Vaping Use   Vaping Use: Never used  Substance and Sexual Activity   Alcohol use: No    Alcohol/week: 0.0 standard drinks of alcohol   Drug use: No   Sexual activity: Never  Other Topics Concern   Not on file  Social History Narrative   Fun: Play and exercise, read   Denies religious beliefs effecting health care.    Social Determinants of Health   Financial Resource Strain: Not on file  Food Insecurity: No Food Insecurity (11/17/2020)   Hunger Vital Sign    Worried About Running Out of Food in the Last Year: Never true    Ran Out of Food in the Last Year: Never true  Transportation Needs: No Transportation Needs (11/17/2020)   PRAPARE -  Hydrologist (Medical): No    Lack of Transportation (Non-Medical): No  Physical Activity: Not on file  Stress: Not on file  Social Connections: Not on file   Past Surgical History:  Procedure Laterality Date   feet surgery Bilateral    HEMORRHOID SURGERY N/A 01/22/2018   Procedure: HEMORRHOIDECTOMY SINGLE COLUMN;  Surgeon: Leighton Ruff, MD;  Location: [facility];  Service: General;  Laterality: N/A;   HIP SURGERY Bilateral yrs ago   KNEE SURGERY Bilateral    REMOVAL OF DRUG DELIVERY IMPLANT Left 03/08/2021   Procedure: REMOVAL OF DRUG DELIVERY IMPLANT (NEXPLANON) FROM  LEFT UPPER ARM;  Surgeon: Lavonia Drafts, MD;  Location: [facility];  Service: Gynecology;  Laterality: Left;   right wrist surgery     cannot bend at wrist   ROBOTIC ASSISTED TOTAL HYSTERECTOMY Bilateral 03/08/2021   Procedure: XI ROBOTIC ASSISTED TOTAL HYSTERECTOMY WITH SALPINGECTOMY, CYSTOSCOPY;  Surgeon: Lavonia Drafts, MD;  Location: [facility];  Service: Gynecology;  Laterality: Bilateral;   Past Medical History:  Diagnosis Date   Cerebral palsy ([facility])    legs stiff do not bend   Eczema    comes and goes per mother  on neck at times uses cocoa butter prn right neck drying out now per mother   Seizures ([place])    Childhood seizures - not since age 89   Uses walker    in home for ambulation, uses wheelchair for distance  LMP 02/15/2021   Opioid Risk Score:   Fall Risk Score:  `1  Depression screen [facility] 2/9     07/03/2022    1:43 PM 11/17/2020    5:12 PM 09/03/2019    8:18 AM 08/07/2017    9:10 AM  Depression screen PHQ 2/9  Decreased Interest 0 0 0 0  Down, Depressed, Hopeless 0 0 0 0  PHQ - 2 Score 0 0 0 0  Altered sleeping  0    Tired, decreased energy  0    Change in appetite  0    Feeling bad or failure about yourself   0    Trouble concentrating  0    Moving slowly or fidgety/restless  0    Suicidal thoughts  0    PHQ-9  Score  0      Review of Systems     Objective:   Physical Exam        Assessment & Plan:

## 2023-03-06 DIAGNOSIS — G4709 Other insomnia: Secondary | ICD-10-CM | POA: Insufficient documentation

## 2023-03-06 DIAGNOSIS — Z7409 Other reduced mobility: Secondary | ICD-10-CM | POA: Insufficient documentation

## 2023-03-06 DIAGNOSIS — R252 Cramp and spasm: Secondary | ICD-10-CM | POA: Insufficient documentation

## 2023-03-06 MED ORDER — BACLOFEN 10 MG PO TABS: 10.0000 mg | ORAL_TABLET | Freq: Three times a day (TID) | ORAL | 2 refills | Status: DC | PRN

## 2023-03-06 NOTE — Assessment & Plan Note (Signed)
I will have you follow-up with me in a couple of weeks for Botox injections into the bilateral lower extremities.  The goal of this is to improve your mobility with your walker and improve the ability to clean and provide cares.  After this appointment, I will send a referral to physical therapy to hopefully get you moving within 2 to 4 weeks of injections.  I will then follow-up with you again in 6 to 8 weeks after injections.  In the interim, I gave you a prescription for baclofen as needed for severe spasms.  Monitor for worsening constipation with this medication.

## 2023-03-06 NOTE — Assessment & Plan Note (Signed)
Will give a call to [employer] to look into repairing your wheelchair, visit positioning straps for your feet specifically.  Will get wheelchair eval with PT post-botox if needed.

## 2023-03-06 NOTE — Assessment & Plan Note (Signed)
Chronic, refusing treatment at this time

## 2023-03-06 NOTE — Assessment & Plan Note (Signed)
Plan for botox 300 U per leg Targetting b/l quadriceps, adductors, and plantarflexors For positioning, mobility, caregiver burden and hygiene

## 2023-03-22 ENCOUNTER — Encounter
Payer: Managed Care, Other (non HMO) | Attending: Physical Medicine & Rehabilitation | Admitting: Physical Medicine & Rehabilitation

## 2023-03-22 ENCOUNTER — Encounter: Payer: Self-pay | Admitting: Physical Medicine & Rehabilitation

## 2023-03-22 VITALS — BP 162/89 | HR 99 | Ht 59.0 in | Wt 110.0 lb

## 2023-03-22 DIAGNOSIS — R252 Cramp and spasm: Secondary | ICD-10-CM | POA: Insufficient documentation

## 2023-03-22 MED ORDER — ONABOTULINUMTOXINA 100 UNITS IJ SOLR
600.0000 [IU] | Freq: Once | INTRAMUSCULAR | Status: AC
  Administered 2023-03-22: 600 [IU] via INTRAMUSCULAR

## 2023-03-22 NOTE — Progress Notes (Signed)
Botox Injection for spasticity using needle EMG guidance  Dilution: 50 Units/ml Indication: Severe spasticity which interferes with ADL,mobility and/or  hygiene and is unresponsive to medication management and other conservative care Informed consent was obtained after describing risks and benefits of the procedure with the patient. This includes bleeding, bruising, infection, excessive weakness, or medication side effects. A REMS form is on file and signed. Needle: 27g 1" needle electrode Number of units per muscle  Add long R 50,L 50 Add mag R 50,L 50 Vast int R 50,L 50 Vast med R 50,L 50 Rect Fem R 50,L 50 Gastroc R 50,L 50  All injections were done after obtaining appropriate EMG activity and after negative drawback for blood. The patient tolerated the procedure well. Post procedure instructions were given. A followup appointment was made.    Consider 2" needle for next injection

## 2023-05-02 ENCOUNTER — Encounter: Payer: Managed Care, Other (non HMO) | Admitting: Physical Medicine and Rehabilitation

## 2023-05-23 ENCOUNTER — Encounter: Payer: Self-pay | Admitting: Physical Medicine and Rehabilitation

## 2023-05-23 ENCOUNTER — Encounter
Payer: Managed Care, Other (non HMO) | Attending: Physical Medicine & Rehabilitation | Admitting: Physical Medicine and Rehabilitation

## 2023-05-23 VITALS — BP 127/63 | HR 113 | Ht 59.0 in | Wt 119.2 lb

## 2023-05-23 DIAGNOSIS — Z7409 Other reduced mobility: Secondary | ICD-10-CM | POA: Diagnosis not present

## 2023-05-23 DIAGNOSIS — G801 Spastic diplegic cerebral palsy: Secondary | ICD-10-CM | POA: Insufficient documentation

## 2023-05-23 DIAGNOSIS — R252 Cramp and spasm: Secondary | ICD-10-CM | POA: Diagnosis not present

## 2023-05-23 DIAGNOSIS — Z789 Other specified health status: Secondary | ICD-10-CM | POA: Insufficient documentation

## 2023-05-23 MED ORDER — BACLOFEN 10 MG PO TABS
10.0000 mg | ORAL_TABLET | Freq: Three times a day (TID) | ORAL | 2 refills | Status: DC | PRN
Start: 2023-05-23 — End: 2024-04-24

## 2023-05-23 NOTE — Progress Notes (Addendum)
Subjective:    Patient ID: Kerri Harris, female    DOB: 1991/07/30, 32 y.o.   MRN: [id number]  HPI  Kerri Harris is a 32 y.o. year old female  who  has a past medical history of Cerebral palsy (HCC), Eczema, Seizures (HCC), and Uses walker.   They are presenting to PM&R clinic for follow up related to spastic diplegic CP with b/l LE weakness and spasticity .  Plan from last visit:   Spastic diplegic cerebral palsy [facility]) Assessment & Plan: I will have you follow-up with me in a couple of weeks for Botox injections into the bilateral lower extremities.  The goal of this is to improve your mobility with your walker and improve the ability to clean and provide cares.  After this appointment, I will send a referral to physical therapy to hopefully get you moving within 2 to 4 weeks of injections.   I will then follow-up with you again in 6 to 8 weeks after injections.   In the interim, I gave you a prescription for baclofen as needed for severe spasms.  Monitor for worsening constipation with this medication.      Spasticity Assessment & Plan: Plan for botox 300 U per leg Targetting b/l quadriceps, adductors, and plantarflexors For positioning, mobility, caregiver burden and hygiene     Impaired mobility and ADLs Assessment & Plan: Will give a call to [employer] to look into repairing your wheelchair, visit positioning straps for your feet specifically.       Other insomnia Assessment & Plan: Chronic, refusing treatment at this time   Botox with Dr. Wynn Banker 03/22/23 Add long R 50,L 50 Add mag R 50,L 50 Vast int R 50,L 50 Vast med R 50,L 50 Rect Fem R 50,L 50 Gastroc R 50,L 50   Interval Hx:  - Therapies: None; all her services at school stopped after school. She hasn't been since she was a kid. Does some home stretching but no HEP.    - Follow ups: None   - Falls: none   - DME: [employer] never fixed WC. Uses posture control walker >42 years old, wheels aren't  working well anymore which makes it hard for her to ambulate.    - Medications: [facility] the [facility] prescription.  BM once weekly, uses metamucil. Discussed s/e baclofen for constipation worsening and need to titrate medication carefully; mother understanding.    - Other concerns: Didn't get bracing from last time.   Pain Inventory Average Pain 0 Pain Right Now 0 My pain is  NO PAIN  BOWEL Number of stools per week: 1-2 per month Oral laxative use Yes  Type of laxative metamucil  BLADDER Normal  Mobility use a walker ability to climb steps?  no do you drive?  no use a wheelchair  Function disabled: date disabled .  Neuro/Psych trouble walking spasms  Prior Studies Any changes since last visit?  no  Physicians involved in your care Any changes since last visit?  no   Family History  Problem Relation Age of Onset   Arthritis Mother    Hypertension Maternal Grandmother    Diabetes Maternal Grandmother    Social History   Socioeconomic History   Marital status: Single    Spouse name: Not on file   Number of children: 0   Years of education: 12   Highest education level: Not on file  Occupational History   Not on file  Tobacco Use   Smoking status: Never  Smokeless tobacco: Never  Vaping Use   Vaping Use: Never used  Substance and Sexual Activity   Alcohol use: No    Alcohol/week: 0.0 standard drinks of alcohol   Drug use: No   Sexual activity: Never  Other Topics Concern   Not on file  Social History Narrative   Fun: Play and exercise, read   Denies religious beliefs effecting health care.    Social Determinants of Health   Financial Resource Strain: Not on file  Food Insecurity: No Food Insecurity (11/17/2020)   Hunger Vital Sign    Worried About Running Out of Food in the Last Year: Never true    Ran Out of Food in the Last Year: Never true  Transportation Needs: No Transportation Needs (11/17/2020)   PRAPARE - Therapist, art (Medical): No    Lack of Transportation (Non-Medical): No  Physical Activity: Not on file  Stress: Not on file  Social Connections: Not on file   Past Surgical History:  Procedure Laterality Date   feet surgery Bilateral    HEMORRHOID SURGERY N/A 01/22/2018   Procedure: HEMORRHOIDECTOMY SINGLE COLUMN;  Surgeon: Romie Levee, MD;  Location: [facility];  Service: General;  Laterality: N/A;   HIP SURGERY Bilateral yrs ago   KNEE SURGERY Bilateral    REMOVAL OF DRUG DELIVERY IMPLANT Left 03/08/2021   Procedure: REMOVAL OF DRUG DELIVERY IMPLANT (NEXPLANON) FROM  LEFT UPPER ARM;  Surgeon: Willodean Rosenthal, MD;  Location: [facility];  Service: Gynecology;  Laterality: Left;   right wrist surgery     cannot bend at wrist   ROBOTIC ASSISTED TOTAL HYSTERECTOMY Bilateral 03/08/2021   Procedure: XI ROBOTIC ASSISTED TOTAL HYSTERECTOMY WITH SALPINGECTOMY, CYSTOSCOPY;  Surgeon: Willodean Rosenthal, MD;  Location: [facility];  Service: Gynecology;  Laterality: Bilateral;   Past Medical History:  Diagnosis Date   Cerebral palsy (HCC)    legs stiff do not bend   Eczema    comes and goes per mother  on neck at times uses cocoa butter prn right neck drying out now per mother   Seizures (HCC)    Childhood seizures - not since age 3   Uses walker    in home for ambulation, uses wheelchair for distance   BP 127/63   Pulse (!) 113   Ht 4\' 11"  (1.499 m)   Wt 119 lb 3.2 oz (54.1 kg)   LMP 02/15/2021   SpO2 98%   BMI 24.08 kg/m   Opioid Risk Score:   Fall Risk Score:  `1  Depression screen [facility] 2/9     05/23/2023   11:06 AM 02/28/2023    1:02 PM 07/03/2022    1:43 PM 11/17/2020    5:12 PM 09/03/2019    8:18 AM 08/07/2017    9:10 AM  Depression screen PHQ 2/9  Decreased Interest 0 1 0 0 0 0  Down, Depressed, Hopeless 0 0 0 0 0 0  PHQ - 2 Score 0 1 0 0 0 0  Altered sleeping  2  0    Tired, decreased energy  0  0    Change in  appetite  0  0    Feeling bad or failure about yourself   0  0    Trouble concentrating  0  0    Moving slowly or fidgety/restless  0  0    Suicidal thoughts  0  0    PHQ-9 Score  3  0  Review of Systems  Constitutional: Negative.   HENT: Negative.    Eyes: Negative.   Respiratory: Negative.    Cardiovascular: Negative.   Gastrointestinal: Negative.   Endocrine: Negative.   Genitourinary: Negative.   Musculoskeletal:  Positive for gait problem.  Skin: Negative.   Allergic/Immunologic: Negative.   Hematological: Negative.   Psychiatric/Behavioral: Negative.    All other systems reviewed and are negative.      Objective:   Physical Exam   PE: Constitution: Appropriate appearance for age. No apparent distress  Resp: No respiratory distress. No accessory muscle usage. on RA and CTAB Cardio: Well perfused appearance.  No peripheral edema. Abdomen: Nondistended. Nontender.   Psych: Appropriate mood and affect. Neuro: AAOx4. No apparent cognitive deficits   Neurologic Exam:   Gait: With standing walker, decreased foot clearance with catching of R toe with knee extension and plantarflexion, compensatory hip hyperextension to swing through.   Bilateral lower extremity posturing with knees extended and feet plantarflexed RLE: MAS 3 HF, MAS 2 adductors, MAS 3 KE, MAS 4 PF  Can PROM foot to neutral, knee to -60 degrees    LLE: MAS 3 HF, MAS 2 adductors, MAS 3 KE, MAS 4 PF  Can PROM foot to neutral, knee to -30 degrees      Assessment & Plan:   Kerri Harris is a 32 y.o. year old female  who  has a past medical history of Cerebral palsy (HCC), Eczema, Seizures (HCC), and Uses walker.    They are presenting to PM&R clinic for follow up related to spastic diplegic CP with b/l LE weakness and spasticity .  Spastic diplegic cerebral palsy (HCC) Impaired mobility and ADLs  I am sending you to PT to work on mobility and ambulation. I am also asking that they try braces on  your right foot to see if this will help you with your mobility.  I am sending a equipment referral to replace your posture control walker  More than 50% of today's face-to-face evaluation involved discussion of and evaluation for durable medical equipment to allow mobility and basic ADL's. Due to the patient's diplegic cerebral palsy, bilateral leg spasticity, coordination deficits and weakness, the patient requires a custom wheelchair to allow for mobility, positioning, pressure offloading, and reduced caregiver burden. Her current chair is >62 years old and breaking down in several places; therefore a new wheelchair evaluation is appropriate at this time. Referral sent for wheelchair evaluation.  Additionally, discussed need for replacement of posture control walker, as the device is greater than 35 years old and the wheels no longer work appropriately and catch which prevents smooth, safe ambulation.  His device is necessary to allow weightbearing and functional independence in the home, and in order to reduce risk of fractures, contractures, and wounds.  **addendum** Discussed with PT evaluation and appropriateness of RLE AFO. Based on my clinical assessment from this visit, patient has severe bilateral plantarflexion tone and could benefit from an AFO to assist in gait stability and improved standing tolerance. Script sent and PT notified.   Spasticity I am sending you a new prescription for baclofen, 10 mg 3 times daily as needed.  Be aware, this medication is for spasticity, but can worsen constipation so use sparingly.  Had Botox with Dr. Wynn Banker 03/22/23; ROM seems much improved in quads especially but continuing difficulties with positioning and ambulation, especially with R quad  Follow-up with me for repeat injections in July. Will keep 600U; may redistribute as below:   Add  long R 50 ->30 ,L 50 ->30 Add mag R 50 ->30 ,L 50 ->30 Vast int R 50 -> 70,L 50 Vast med R 50 ->70,L 50 Rect  Fem R 50 -> 70,L 50 -> 70 Gastroc R 50,L 50   Other orders -     Baclofen; Take 1 tablet (10 mg total) by mouth 3 (three) times daily as needed for muscle spasms.  Dispense: 90 each; Refill: 2

## 2023-05-23 NOTE — Patient Instructions (Signed)
I am sending you to PT to work on mobility and ambulation. I am also asking that they try braces on your right foot to see if this will help you with your mobility.  I am sending you a new prescription for baclofen, 10 mg 3 times daily as needed.  Be aware, this medication is for spasticity, but can worsen constipation so use sparingly.  I am sending a equipment referral to replace your posture control walLKER  I will be reaching out to [employer] again in regards to repair of your wheelchair  Follow-up with me for repeat injections in July.

## 2023-05-29 NOTE — Therapy (Signed)
OUTPATIENT PHYSICAL THERAPY NEURO EVALUATION   Patient Name: Kerri Harris MRN: [id number] DOB:06-Aug-1991, 32 y.o., female Today's Date: 05/30/2023   PCP: Etta Grandchild, MD  REFERRING PROVIDER: Angelina Sheriff, DO  END OF SESSION:  PT End of Session - 05/30/23 1457     Visit Number 1    Number of Visits 5    Date for PT Re-Evaluation 07/29/23   due to delay in scheduling   Authorization Type [employer]    PT Start Time 1103    PT Stop Time 1145    PT Time Calculation (min) 42 min    Equipment Utilized During Treatment Gait belt    Activity Tolerance Patient tolerated treatment well    Behavior During Therapy WFL for tasks assessed/performed             Past Medical History:  Diagnosis Date   Cerebral palsy (HCC)    legs stiff do not bend   Eczema    comes and goes per mother  on neck at times uses cocoa butter prn right neck drying out now per mother   Seizures (HCC)    Childhood seizures - not since age 11   Uses walker    in home for ambulation, uses wheelchair for distance   Past Surgical History:  Procedure Laterality Date   feet surgery Bilateral    HEMORRHOID SURGERY N/A 01/22/2018   Procedure: HEMORRHOIDECTOMY SINGLE COLUMN;  Surgeon: Romie Levee, MD;  Location: [facility];  Service: General;  Laterality: N/A;   HIP SURGERY Bilateral yrs ago   KNEE SURGERY Bilateral    REMOVAL OF DRUG DELIVERY IMPLANT Left 03/08/2021   Procedure: REMOVAL OF DRUG DELIVERY IMPLANT (NEXPLANON) FROM  LEFT UPPER ARM;  Surgeon: Willodean Rosenthal, MD;  Location: [facility];  Service: Gynecology;  Laterality: Left;   right wrist surgery     cannot bend at wrist   ROBOTIC ASSISTED TOTAL HYSTERECTOMY Bilateral 03/08/2021   Procedure: XI ROBOTIC ASSISTED TOTAL HYSTERECTOMY WITH SALPINGECTOMY, CYSTOSCOPY;  Surgeon: Willodean Rosenthal, MD;  Location: [facility];  Service: Gynecology;  Laterality: Bilateral;   Patient Active Problem List   Diagnosis  Date Noted   Spasticity 03/06/2023   Impaired mobility and ADLs 03/06/2023   Other insomnia 03/06/2023   Implanon in place 11/17/2020   Rocker bottom foot deformity 09/08/2019   Foot pain, bilateral 09/02/2019   Atopic dermatitis 11/14/2017   Encounter for general adult medical examination with abnormal findings 08/07/2017   Cerebral palsy (HCC) 07/20/2016   Needs assistance while at home 06/11/2015    ONSET DATE: 05/23/2023 (date of referral)  REFERRING DIAG: G80.1 (ICD-10-CM) - Spastic diplegic cerebral palsy (HCC) Z74.09,Z78.9 (ICD-10-CM) - Impaired mobility and ADLs  THERAPY DIAG:  Other abnormalities of gait and mobility  Cerebral palsy, unspecified type (HCC)  Muscle weakness (generalized)  Abnormal posture  Rationale for Evaluation and Treatment: Rehabilitation  SUBJECTIVE:  SUBJECTIVE STATEMENT: Has not had therapy since she graduated therapy at the end of High School. Has an old posture control walker (posterior walker) >27 years old that is very bulky. Wheels don't work and its too big to use around the house. Has a [employer] wheelchair and reports the brakes and foot rest is not working and does not fit her like it used to. Has been >5 years since pt got a new chair. Does not use her wheelchair inside, also uses 2 wheeled RW. Drags her feet when she walks and does not pick them up, has a Rocker bottom foot deformity which causes her to walk on the side of her foot. Dr. Shearon Stalls wanted her to try an AFO. No falls. Will be going on a cruise on Saturday for the first time. Brother takes her to the pool in the summer to do some exercises to swim and float. Picked up the Baclofen, but has not started it yet. Got Botox in both legs for spasticity, just one, will get it back again in July. Uses the  wheelchair out in the community.  Pt accompanied by:  Mom, Shanda Bumps  PERTINENT HISTORY:  PMH: spastic diplegic CP with b/l LE weakness and spasticity, hx of seizures (not since age 13), Rocker bottom foot deformity   Uses posture control walker >26 years old, wheels aren't working well anymore which makes it hard for her to ambulate.  [employer] never fixed WC   PAIN:  Are you having pain? No "high pain tolerance"   PRECAUTIONS: Fall  WEIGHT BEARING RESTRICTIONS: No  FALLS: Has patient fallen in last 6 months? No Pt's mom reports that if she does have a fall, will be more like a slide like off the couch   LIVING ENVIRONMENT: Lives with: lives with their family Lives in: House/apartment Stairs: Can live on ground floor, has 1 step - has to hold onto the doorframe and get help from family  Looking into getting a stair lift  Has following equipment at home: Dan Humphreys - 2 wheeled, Wheelchair (manual), shower chair, Grab bars, and has a posture control/posterior walker that is >28 years old   PLOF: Independent with household mobility with device, Needs assistance with ADLs, and for the most part, pt is able to transfer herself.   PATIENT GOALS: Wants to get legs stronger   OBJECTIVE:   COGNITION: Overall cognitive status: History of cognitive impairments - at baseline   SENSATION: Light touch: WFL Reports legs will feel numb at times   COORDINATION: Impaired due to spasticity   MUSCLE TONE:  Significant BLE spasticity to knee extensors, adductors, PFs, without legs held in foot rests by strap, legs held mainly in knee extension   POSTURE: rounded shoulders, forward head, and posterior pelvic tilt  BLE posturing with knees held in extension and feet plantarflexed    LOWER EXTREMITY ROM:    Significantly limited knee flexion bilat due to spasticity   With ankle DF, can reach neutral PROM R>LLE   LOWER EXTREMITY MMT:    MMT Right Eval Left Eval  Hip flexion 2+ 2+  Hip  extension    Hip abduction    Hip adduction    Hip internal rotation    Hip external rotation    Knee flexion 2 2  Knee extension 4- 4-  Ankle dorsiflexion Unable to accurately assess Unable to accurately assess  Ankle plantarflexion    Ankle inversion    Ankle eversion    (Blank rows = not tested)  BED MOBILITY:  Pt reports no significant difficulties with this at home.   TRANSFERS: Assistive device utilized: Environmental consultant - 2 wheeled  Sit to stand: CGA Stand to sit: CGA With RW and from pt's w/c (PT having to help steady pt's w/c as brakes don't currently work), pt pushing up through arms to stand as BLE held out into extension, once pt semi gets into standing, moves hands to RW and slowly walks feet back under her. Pt with incr forward flexed posture in standing at RW.   GAIT: Gait pattern: step to pattern, decreased stance time- Right, decreased stance time- Left, decreased stride length, decreased hip/knee flexion- Right, decreased hip/knee flexion- Left, decreased ankle dorsiflexion- Right, decreased ankle dorsiflexion- Left, Right hip hike, Right foot flat, Left foot flat, trunk flexed, poor foot clearance- Right, and poor foot clearance- Left Distance walked: approx. 40'  Assistive device utilized: Environmental consultant - 2 wheeled Level of assistance: Min A Comments: Pt with significant forward flexed posture over RW and staying outside of its BOS. Pt with catching of R toe with gait with extension and PF. Pt ambulating with incr R > L toe out. No knee flexion with gait    FUNCTIONAL TESTS:  10 meter walk test: 33.1 seconds with RW = .99 ft/sec    TODAY'S TREATMENT:                                                                                                                              N/A during eval    PATIENT EDUCATION: Education details: Clinical findings, POC, getting new referral for w/c eval and will choose between [employer] or [employer], will trial out posterior RW in session (will  work with [employer] to bring one in future session - Dr. Shearon Stalls has already put in an order for a new once), trialing AFOs in future session to see if it will help with foot clearance, getting pt started on stretching program for home  Person educated: Patient and Parent Education method: Explanation Education comprehension: verbalized understanding  HOME EXERCISE PROGRAM: Will provide in future sessions   GOALS: Goals reviewed with patient? Yes  SHORT TERM GOALS: ALL STGS = LTGS   LONG TERM GOALS: Target date: 06/27/2023   Pt will be independent with initial HEP with family support for stretching/ROM and strength as able. Baseline: pt currently with no HEP Goal status: INITIAL  2.  Pt will undergo trials of AFOs to RLE to determine if it will assist with RLE foot clearance during gait.  Baseline:  Goal status: INITIAL  3.  Pt will trial posterior rollator/RW to determine improved gait mechanics for household mobility.  Baseline: [employer] to bring one to clinic  Goal status: INITIAL  4.  Pt will improve gait speed with RW vs. Posterior RW to at least 1.2 ft/sec in order to demo decr fall risk/improved household mobility.  Baseline: 33.1 seconds with RW = .99 ft/sec  Goal status: INITIAL  ASSESSMENT:  CLINICAL IMPRESSION: Patient is a 32 year old female referred to Neuro OPPT for spastic diplegic CP with b/l LE weakness and spasticity. Pt ambulates at home with RW, but uses wheelchair to go out in the community. Pt's mom/family needs to help pt with ADLs. The following deficits were present during the exam: impaired balance, significant spasticity to BLE into knee extension, hip ADD, ankle PF, decr ROM, decr strength, postural abnormalities, gait abnormalities, decr activity tolerance, impaired sensation. . Based on gait speed with RW, pt is an incr risk for falls. Pt would benefit from skilled PT to address these impairments and functional limitations to maximize functional  mobility independence, get started on a stretching/strengthening program, see if an AFO would be appropriate, and trial new posterior RW to see if it will help pt's gait mechanics. Will also send wheelchair referral request due to pt has outgrown her current w/c and has poor positioning and brakes are broken (it has also been >5 years)   OBJECTIVE IMPAIRMENTS: Abnormal gait, decreased activity tolerance, decreased balance, decreased cognition, decreased coordination, decreased endurance, decreased mobility, difficulty walking, decreased ROM, decreased strength, hypomobility, increased muscle spasms, impaired flexibility, impaired sensation, impaired tone, and postural dysfunction.   ACTIVITY LIMITATIONS: carrying, lifting, bending, standing, squatting, stairs, transfers, bed mobility, bathing, toileting, dressing, reach over head, hygiene/grooming, and locomotion level  PARTICIPATION LIMITATIONS: meal prep, cleaning, driving, shopping, and community activity  PERSONAL FACTORS: Age, Behavior pattern, Past/current experiences, Time since onset of injury/illness/exacerbation, and spastic diplegic CP with b/l LE weakness and spasticity, hx of seizures (not since age 12), Rocker bottom foot deformity   are also affecting patient's functional outcome.   REHAB POTENTIAL: Fair due to diagnosis  CLINICAL DECISION MAKING: Evolving/moderate complexity  EVALUATION COMPLEXITY: Moderate  PLAN:  PT FREQUENCY: 1x/week  PT DURATION: 8 weeks  PLANNED INTERVENTIONS: Therapeutic exercises, Therapeutic activity, Neuromuscular re-education, Balance training, Gait training, Patient/Family education, Self Care, Joint mobilization, Orthotic/Fit training, DME instructions, Aquatic Therapy, Wheelchair mobility training, Manual therapy, and Re-evaluation  PLAN FOR NEXT SESSION: [employer] to bring in posterior walker for pt to try (pt's current one is over 50 years old and is broken), did w/c referral come in? Make sure  pt gets scheduled for this. Initiate HEP for stretching program. Trial AFOs to RLE to see if it helps with gait? Pt also with foot deformity so unsure. Pt currently using RW to ambulate   Sherlie Ban, PT, DPT 05/30/23 2:58 PM

## 2023-05-30 ENCOUNTER — Telehealth: Payer: Self-pay | Admitting: Physical Therapy

## 2023-05-30 ENCOUNTER — Ambulatory Visit: Payer: Managed Care, Other (non HMO) | Attending: Physical Medicine and Rehabilitation | Admitting: Physical Therapy

## 2023-05-30 DIAGNOSIS — R2689 Other abnormalities of gait and mobility: Secondary | ICD-10-CM | POA: Diagnosis present

## 2023-05-30 DIAGNOSIS — Z789 Other specified health status: Secondary | ICD-10-CM | POA: Diagnosis not present

## 2023-05-30 DIAGNOSIS — Z7409 Other reduced mobility: Secondary | ICD-10-CM | POA: Diagnosis not present

## 2023-05-30 DIAGNOSIS — R293 Abnormal posture: Secondary | ICD-10-CM | POA: Insufficient documentation

## 2023-05-30 DIAGNOSIS — M6281 Muscle weakness (generalized): Secondary | ICD-10-CM | POA: Insufficient documentation

## 2023-05-30 DIAGNOSIS — G809 Cerebral palsy, unspecified: Secondary | ICD-10-CM | POA: Insufficient documentation

## 2023-05-30 DIAGNOSIS — G801 Spastic diplegic cerebral palsy: Secondary | ICD-10-CM | POA: Insufficient documentation

## 2023-05-30 NOTE — Telephone Encounter (Signed)
Dr. Shearon Stalls, Kerri Harris was evaluated by PT on 05/28/23 for Cerebral Palsy.  The patient would benefit from a wheelchair evaluation for a new wheelchair for improved positioning/comfort as pt has currently outgrown her currently wheelchair and it has been >5 years.   If you agree, please place an order in [facility] workque in [facility] or fax the order to [phone]. Thank you, Sherlie Ban, PT, DPT 05/30/23 3:08 PM    [facility] [address] [place], [place]  [address] Phone:  [phone] Fax:  [phone]

## 2023-06-04 NOTE — Addendum Note (Signed)
Addended by: Elijah Birk on: 06/04/2023 12:35 AM   Modules accepted: Orders

## 2023-06-15 ENCOUNTER — Telehealth: Payer: Self-pay | Admitting: Physical Therapy

## 2023-06-15 ENCOUNTER — Ambulatory Visit: Payer: Managed Care, Other (non HMO) | Attending: Physical Medicine and Rehabilitation | Admitting: Physical Therapy

## 2023-06-15 DIAGNOSIS — M6281 Muscle weakness (generalized): Secondary | ICD-10-CM | POA: Diagnosis present

## 2023-06-15 DIAGNOSIS — G809 Cerebral palsy, unspecified: Secondary | ICD-10-CM | POA: Diagnosis not present

## 2023-06-15 DIAGNOSIS — M21371 Foot drop, right foot: Secondary | ICD-10-CM | POA: Insufficient documentation

## 2023-06-15 DIAGNOSIS — R2689 Other abnormalities of gait and mobility: Secondary | ICD-10-CM

## 2023-06-15 DIAGNOSIS — R293 Abnormal posture: Secondary | ICD-10-CM | POA: Diagnosis present

## 2023-06-15 NOTE — Therapy (Signed)
OUTPATIENT PHYSICAL THERAPY NEURO TREATMENT   Patient Name: Kerri Harris MRN: [id number] DOB:05-23-1991, 32 y.o., female Today's Date: 06/15/2023   PCP: Etta Grandchild, MD  REFERRING PROVIDER: Angelina Sheriff, DO  END OF SESSION:  PT End of Session - 06/15/23 1019     Visit Number 2    Number of Visits 5    Date for PT Re-Evaluation 07/29/23   due to delay in scheduling   Authorization Type [employer]    PT Start Time 1017    PT Stop Time 1104    PT Time Calculation (min) 47 min    Equipment Utilized During Treatment Gait belt    Activity Tolerance Patient tolerated treatment well    Behavior During Therapy WFL for tasks assessed/performed              Past Medical History:  Diagnosis Date   Cerebral palsy (HCC)    legs stiff do not bend   Eczema    comes and goes per mother  on neck at times uses cocoa butter prn right neck drying out now per mother   Seizures (HCC)    Childhood seizures - not since age 34   Uses walker    in home for ambulation, uses wheelchair for distance   Past Surgical History:  Procedure Laterality Date   feet surgery Bilateral    HEMORRHOID SURGERY N/A 01/22/2018   Procedure: HEMORRHOIDECTOMY SINGLE COLUMN;  Surgeon: Romie Levee, MD;  Location: [facility];  Service: General;  Laterality: N/A;   HIP SURGERY Bilateral yrs ago   KNEE SURGERY Bilateral    REMOVAL OF DRUG DELIVERY IMPLANT Left 03/08/2021   Procedure: REMOVAL OF DRUG DELIVERY IMPLANT (NEXPLANON) FROM  LEFT UPPER ARM;  Surgeon: Willodean Rosenthal, MD;  Location: [facility];  Service: Gynecology;  Laterality: Left;   right wrist surgery     cannot bend at wrist   ROBOTIC ASSISTED TOTAL HYSTERECTOMY Bilateral 03/08/2021   Procedure: XI ROBOTIC ASSISTED TOTAL HYSTERECTOMY WITH SALPINGECTOMY, CYSTOSCOPY;  Surgeon: Willodean Rosenthal, MD;  Location: [facility];  Service: Gynecology;  Laterality: Bilateral;   Patient Active Problem List    Diagnosis Date Noted   Spasticity 03/06/2023   Impaired mobility and ADLs 03/06/2023   Other insomnia 03/06/2023   Implanon in place 11/17/2020   Rocker bottom foot deformity 09/08/2019   Foot pain, bilateral 09/02/2019   Atopic dermatitis 11/14/2017   Encounter for general adult medical examination with abnormal findings 08/07/2017   Cerebral palsy (HCC) 07/20/2016   Needs assistance while at home 06/11/2015    ONSET DATE: 05/23/2023 (date of referral)  REFERRING DIAG: G80.1 (ICD-10-CM) - Spastic diplegic cerebral palsy (HCC) Z74.09,Z78.9 (ICD-10-CM) - Impaired mobility and ADLs  THERAPY DIAG:  Cerebral palsy, unspecified type (HCC)  Muscle weakness (generalized)  Other abnormalities of gait and mobility  Abnormal posture  Rationale for Evaluation and Treatment: Rehabilitation  SUBJECTIVE:  SUBJECTIVE STATEMENT: Pt reports no acute changes since last visit, no falls and no pain today.  Pt accompanied by:  Dad  PERTINENT HISTORY:  PMH: spastic diplegic CP with b/l LE weakness and spasticity, hx of seizures (not since age 59), Rocker bottom foot deformity   Uses posture control walker >76 years old, wheels aren't working well anymore which makes it hard for her to ambulate.  [employer] never fixed WC   PAIN:  Are you having pain? No "high pain tolerance"   PRECAUTIONS: Fall  WEIGHT BEARING RESTRICTIONS: No  FALLS: Has patient fallen in last 6 months? No Pt's mom reports that if she does have a fall, will be more like a slide like off the couch   LIVING ENVIRONMENT: Lives with: lives with their family Lives in: House/apartment Stairs: Can live on ground floor, has 1 step - has to hold onto the doorframe and get help from family  Looking into getting a stair lift  Has following  equipment at home: Dan Humphreys - 2 wheeled, Wheelchair (manual), shower chair, Grab bars, and has a posture control/posterior walker that is >59 years old   PLOF: Independent with household mobility with device, Needs assistance with ADLs, and for the most part, pt is able to transfer herself.   PATIENT GOALS: Wants to get legs stronger   OBJECTIVE:   COGNITION: Overall cognitive status: History of cognitive impairments - at baseline   SENSATION: Light touch: WFL Reports legs will feel numb at times   COORDINATION: Impaired due to spasticity   MUSCLE TONE:  Significant BLE spasticity to knee extensors, adductors, PFs, without legs held in foot rests by strap, legs held mainly in knee extension   POSTURE: rounded shoulders, forward head, and posterior pelvic tilt  BLE posturing with knees held in extension and feet plantarflexed    LOWER EXTREMITY ROM:    Significantly limited knee flexion bilat due to spasticity   With ankle DF, can reach neutral PROM R>LLE   LOWER EXTREMITY MMT:    MMT Right Eval Left Eval  Hip flexion 2+ 2+  Hip extension    Hip abduction    Hip adduction    Hip internal rotation    Hip external rotation    Knee flexion 2 2  Knee extension 4- 4-  Ankle dorsiflexion Unable to accurately assess Unable to accurately assess  Ankle plantarflexion    Ankle inversion    Ankle eversion    (Blank rows = not tested)  BED MOBILITY:  Pt reports no significant difficulties with this at home.   TRANSFERS: Assistive device utilized: Environmental consultant - 2 wheeled  Sit to stand: CGA Stand to sit: CGA With RW and from pt's w/c (PT having to help steady pt's w/c as brakes don't currently work), pt pushing up through arms to stand as BLE held out into extension, once pt semi gets into standing, moves hands to RW and slowly walks feet back under her. Pt with incr forward flexed posture in standing at RW.   GAIT: Gait pattern: step to pattern, decreased stance time- Right,  decreased stance time- Left, decreased stride length, decreased hip/knee flexion- Right, decreased hip/knee flexion- Left, decreased ankle dorsiflexion- Right, decreased ankle dorsiflexion- Left, Right hip hike, Right foot flat, Left foot flat, trunk flexed, poor foot clearance- Right, and poor foot clearance- Left Distance walked: approx. 40'  Assistive device utilized: Environmental consultant - 2 wheeled Level of assistance: Min A Comments: Pt with significant forward flexed posture over RW and staying  outside of its BOS. Pt with catching of R toe with gait with extension and PF. Pt ambulating with incr R > L toe out. No knee flexion with gait    FUNCTIONAL TESTS:  10 meter walk test: 33.1 seconds with RW = .99 ft/sec    TODAY'S TREATMENT:                                                                                                                                Gait/Orthotic Assessment Gait pattern: decreased stride length, decreased hip/knee flexion- Right, decreased hip/knee flexion- Left, decreased ankle dorsiflexion- Right, decreased ankle dorsiflexion- Left, genu recurvatum- Right, genu recurvatum- Left, trunk flexed, wide BOS, poor foot clearance- Right, and poor foot clearance- Left Distance walked: 115 ft Assistive device utilized:  crocodile posterior RW Level of assistance: Min A Comments: needs min A to stand and pivot to RW from mat table and/or clinic manual w/c; gait with no bracing, pt needs cues to advance RLE at time and exhibits above-mentioned gait deviations  Gait pattern:  see above Distance walked: 115 ft Assistive device utilized:   crocodile posterior RW Level of assistance: Min A Comments: trial gait with R foot up brace, improved limb clearance but does still need cues to attend to limb and to advance RLE   Gait pattern:  see above Distance walked: 115 ft Assistive device utilized:   crocodile posterior RW Level of assistance: Min A Comments: trial gait with Ottobock R  PLS AFO with medial strut, improved LE clearance and decreased instances of limb catching  Gait pattern:  see above Distance walked: 115 ft Assistive device utilized:   crocodile posterior RW Level of assistance: Min A Comments: trial gait with R Thusane PLS AFO with lateral strut, still catches RLE frequently along the floor   Based on trial of various RLE bracing options this session pt exhibits the most improved gait mechanics with use of Ottobock PLS with medial strut. Will reach out to referring provider for order for an AFO. Educated pt and her dad on process to obtain an AFO.  Additionally, pt does well overall with gait with posterior RW this date. Locked backwards mechanism during gait to prevent LOB backwards during ambulation and increased resistance against wheels in order to better control patient's speed during gait. Pt and her family interested in getting her a new w/c but also report that she needs a new posterior walker. Discussed with patient's dad that insurance will likely only cover one device, but can further discuss in future sessions and with [employer] or [employer] rep.    PATIENT EDUCATION: Education details: use of posterior walker and getting a new posterior walker vs a new w/c; pros/cons of each type of brace trialed this session and recommendations based on gait observations, process for obtaining an AFO Person educated: Patient and Parent Education method: Explanation Education comprehension: verbalized understanding  HOME EXERCISE PROGRAM: Will provide in future sessions  GOALS: Goals reviewed with patient? Yes  SHORT TERM GOALS: ALL STGS = LTGS   LONG TERM GOALS: Target date: 06/27/2023   Pt will be independent with initial HEP with family support for stretching/ROM and strength as able. Baseline: pt currently with no HEP Goal status: INITIAL  2.  Pt will undergo trials of AFOs to RLE to determine if it will assist with RLE foot clearance during gait.   Baseline: trialed 7/12, recommending Ottobock medial strut (7/12) Goal status: GOAL MET  3.  Pt will trial posterior rollator/RW to determine improved gait mechanics for household mobility.  Baseline: [employer] to bring one to clinic, trialed 7/12 with min A needed and can benefit from further practice (7/12)  Goal status: GOAL MET  4.  Pt will improve gait speed with RW vs. Posterior RW to at least 1.2 ft/sec in order to demo decr fall risk/improved household mobility.  Baseline: 33.1 seconds with RW = .99 ft/sec  Goal status: INITIAL    ASSESSMENT:  CLINICAL IMPRESSION: Emphasis of skilled PT session on trialing posterior walker as well as various RLE bracing options for increased safety and independence with functional mobility. Pt exhibits ability to maneuver posterior walker with back wheels in locked position and min A for steering device as well as min A overall for balance. Pt does exhibit difficulty advancing her RLE, improved with use of R Ottobock PLS with medial strut this date as compared to other bracing options trialed. Pt continues to benefit from skilled therapy services in order to continue to practice with posterior RW, be set up with a home stretching program, and to work on improving her balance in order to decrease her fall risk. Continue POC.    OBJECTIVE IMPAIRMENTS: Abnormal gait, decreased activity tolerance, decreased balance, decreased cognition, decreased coordination, decreased endurance, decreased mobility, difficulty walking, decreased ROM, decreased strength, hypomobility, increased muscle spasms, impaired flexibility, impaired sensation, impaired tone, and postural dysfunction.   ACTIVITY LIMITATIONS: carrying, lifting, bending, standing, squatting, stairs, transfers, bed mobility, bathing, toileting, dressing, reach over head, hygiene/grooming, and locomotion level  PARTICIPATION LIMITATIONS: meal prep, cleaning, driving, shopping, and community  activity  PERSONAL FACTORS: Age, Behavior pattern, Past/current experiences, Time since onset of injury/illness/exacerbation, and spastic diplegic CP with b/l LE weakness and spasticity, hx of seizures (not since age 55), Rocker bottom foot deformity   are also affecting patient's functional outcome.   REHAB POTENTIAL: Fair due to diagnosis  CLINICAL DECISION MAKING: Evolving/moderate complexity  EVALUATION COMPLEXITY: Moderate  PLAN:  PT FREQUENCY: 1x/week  PT DURATION: 8 weeks  PLANNED INTERVENTIONS: Therapeutic exercises, Therapeutic activity, Neuromuscular re-education, Balance training, Gait training, Patient/Family education, Self Care, Joint mobilization, Orthotic/Fit training, DME instructions, Aquatic Therapy, Wheelchair mobility training, Manual therapy, and Re-evaluation  PLAN FOR NEXT SESSION: discuss w/c vs posterior walker and does pt/family want to use [employer] or [employer]? Initiate HEP for stretching program. Did we get AFO order? R Ottobock with medial strut vs custom AFO? Pt also with foot deformity so unsure. Pt currently using RW to ambulate, continue to practice with posterior RW   Peter Congo, PT, DPT, CSRS  06/15/23 11:05 AM

## 2023-06-15 NOTE — Telephone Encounter (Signed)
Dr. Shearon Stalls,  Kerri Harris is being treated by physical therapy for her CP.  [name] will benefit from use of a Right AFO in order to improve safety with functional mobility.    If you agree, please submit request in EPIC under MD Order, Other Orders (list Right AFO in comments) or fax to [facility] at [phone].   Additionally, could you please addend your latest visit note to include this recommendation for a Right AFO?  Thank you, Peter Congo, PT, DPT, [facility] [address] [place], [place]  [address] Phone:  [phone] Fax:  [phone]

## 2023-06-20 ENCOUNTER — Encounter: Payer: Self-pay | Admitting: Physical Therapy

## 2023-06-20 ENCOUNTER — Ambulatory Visit: Payer: Managed Care, Other (non HMO) | Admitting: Physical Therapy

## 2023-06-20 DIAGNOSIS — R293 Abnormal posture: Secondary | ICD-10-CM

## 2023-06-20 DIAGNOSIS — M6281 Muscle weakness (generalized): Secondary | ICD-10-CM

## 2023-06-20 DIAGNOSIS — M21371 Foot drop, right foot: Secondary | ICD-10-CM | POA: Insufficient documentation

## 2023-06-20 DIAGNOSIS — G809 Cerebral palsy, unspecified: Secondary | ICD-10-CM | POA: Diagnosis not present

## 2023-06-20 DIAGNOSIS — R2689 Other abnormalities of gait and mobility: Secondary | ICD-10-CM

## 2023-06-20 NOTE — Therapy (Signed)
OUTPATIENT PHYSICAL THERAPY NEURO TREATMENT   Patient Name: Kerri Harris MRN: [id number] DOB:06-19-1991, 32 y.o., female Today's Date: 06/20/2023   PCP: Etta Grandchild, MD  REFERRING PROVIDER: Angelina Sheriff, DO  END OF SESSION:  PT End of Session - 06/20/23 1112     Visit Number 3    Number of Visits 5    Date for PT Re-Evaluation 07/29/23   due to delay in scheduling   Authorization Type [employer]    PT Start Time 1111   pt late to session   PT Stop Time 1145    PT Time Calculation (min) 34 min    Equipment Utilized During Treatment Gait belt    Activity Tolerance Patient tolerated treatment well    Behavior During Therapy WFL for tasks assessed/performed              Past Medical History:  Diagnosis Date   Cerebral palsy (HCC)    legs stiff do not bend   Eczema    comes and goes per mother  on neck at times uses cocoa butter prn right neck drying out now per mother   Seizures (HCC)    Childhood seizures - not since age 32   Uses walker    in home for ambulation, uses wheelchair for distance   Past Surgical History:  Procedure Laterality Date   feet surgery Bilateral    HEMORRHOID SURGERY N/A 01/22/2018   Procedure: HEMORRHOIDECTOMY SINGLE COLUMN;  Surgeon: Romie Levee, MD;  Location: [facility];  Service: General;  Laterality: N/A;   HIP SURGERY Bilateral yrs ago   KNEE SURGERY Bilateral    REMOVAL OF DRUG DELIVERY IMPLANT Left 03/08/2021   Procedure: REMOVAL OF DRUG DELIVERY IMPLANT (NEXPLANON) FROM  LEFT UPPER ARM;  Surgeon: Willodean Rosenthal, MD;  Location: [facility];  Service: Gynecology;  Laterality: Left;   right wrist surgery     cannot bend at wrist   ROBOTIC ASSISTED TOTAL HYSTERECTOMY Bilateral 03/08/2021   Procedure: XI ROBOTIC ASSISTED TOTAL HYSTERECTOMY WITH SALPINGECTOMY, CYSTOSCOPY;  Surgeon: Willodean Rosenthal, MD;  Location: [facility];  Service: Gynecology;  Laterality: Bilateral;   Patient Active  Problem List   Diagnosis Date Noted   Spasticity 03/06/2023   Impaired mobility and ADLs 03/06/2023   Other insomnia 03/06/2023   Implanon in place 11/17/2020   Rocker bottom foot deformity 09/08/2019   Foot pain, bilateral 09/02/2019   Atopic dermatitis 11/14/2017   Encounter for general adult medical examination with abnormal findings 08/07/2017   Cerebral palsy (HCC) 07/20/2016   Needs assistance while at home 06/11/2015    ONSET DATE: 05/23/2023 (date of referral)  REFERRING DIAG: G80.1 (ICD-10-CM) - Spastic diplegic cerebral palsy (HCC) Z74.09,Z78.9 (ICD-10-CM) - Impaired mobility and ADLs  THERAPY DIAG:  Muscle weakness (generalized)  Cerebral palsy, unspecified type (HCC)  Other abnormalities of gait and mobility  Abnormal posture  Rationale for Evaluation and Treatment: Rehabilitation  SUBJECTIVE:  SUBJECTIVE STATEMENT: Had a great time on the cruise. Wants to use the walker again today.   Pt accompanied by:  Ennis Forts   PERTINENT HISTORY:  PMH: spastic diplegic CP with b/l LE weakness and spasticity, hx of seizures (not since age 44), Rocker bottom foot deformity   Uses posture control walker >33 years old, wheels aren't working well anymore which makes it hard for her to ambulate.  [employer] never fixed WC   PAIN:  Are you having pain? No "high pain tolerance"   PRECAUTIONS: Fall  WEIGHT BEARING RESTRICTIONS: No  FALLS: Has patient fallen in last 6 months? No Pt's mom reports that if she does have a fall, will be more like a slide like off the couch   LIVING ENVIRONMENT: Lives with: lives with their family Lives in: House/apartment Stairs: Can live on ground floor, has 1 step - has to hold onto the doorframe and get help from family  Looking into getting a stair  lift  Has following equipment at home: Dan Humphreys - 2 wheeled, Wheelchair (manual), shower chair, Grab bars, and has a posture control/posterior walker that is >33 years old   PLOF: Independent with household mobility with device, Needs assistance with ADLs, and for the most part, pt is able to transfer herself.   PATIENT GOALS: Wants to get legs stronger   OBJECTIVE:   COGNITION: Overall cognitive status: History of cognitive impairments - at baseline   SENSATION: Light touch: WFL Reports legs will feel numb at times   COORDINATION: Impaired due to spasticity   MUSCLE TONE:  Significant BLE spasticity to knee extensors, adductors, PFs, without legs held in foot rests by strap, legs held mainly in knee extension   POSTURE: rounded shoulders, forward head, and posterior pelvic tilt  BLE posturing with knees held in extension and feet plantarflexed    LOWER EXTREMITY ROM:    Significantly limited knee flexion bilat due to spasticity   With ankle DF, can reach neutral PROM R>LLE   LOWER EXTREMITY MMT:    MMT Right Eval Left Eval  Hip flexion 2+ 2+  Hip extension    Hip abduction    Hip adduction    Hip internal rotation    Hip external rotation    Knee flexion 2 2  Knee extension 4- 4-  Ankle dorsiflexion Unable to accurately assess Unable to accurately assess  Ankle plantarflexion    Ankle inversion    Ankle eversion    (Blank rows = not tested)  BED MOBILITY:  Pt reports no significant difficulties with this at home.   TRANSFERS: Assistive device utilized: Environmental consultant - 2 wheeled  Sit to stand: CGA Stand to sit: CGA With RW and from pt's w/c (PT having to help steady pt's w/c as brakes don't currently work), pt pushing up through arms to stand as BLE held out into extension, once pt semi gets into standing, moves hands to RW and slowly walks feet back under her. Pt with incr forward flexed posture in standing at RW.   GAIT: Gait pattern: step to pattern, decreased  stance time- Right, decreased stance time- Left, decreased stride length, decreased hip/knee flexion- Right, decreased hip/knee flexion- Left, decreased ankle dorsiflexion- Right, decreased ankle dorsiflexion- Left, Right hip hike, Right foot flat, Left foot flat, trunk flexed, poor foot clearance- Right, and poor foot clearance- Left Distance walked: approx. 40'  Assistive device utilized: Environmental consultant - 2 wheeled Level of assistance: Min A Comments: Pt with significant forward flexed posture over  RW and staying outside of its BOS. Pt with catching of R toe with gait with extension and PF. Pt ambulating with incr R > L toe out. No knee flexion with gait    FUNCTIONAL TESTS:  10 meter walk test: 33.1 seconds with RW = .99 ft/sec    TODAY'S TREATMENT:                                                                                                                               Therapeutic Activity:  Pt's grandma face-timed pt's mom, PT spoke to pt's mom and they would like to go through [employer] as the vendor for w/c. PT to let front desk know so pt can get scheduled for w/c eval. Discussed insurance may or may not cover both a w/c and a new walker, but PT to reach out and ask. Pt's mom reports that they would get a new w/c through insurance and can buy the walker out of pocket if insurance will not cover both,   Gait/Orthotic Assessment Gait pattern: decreased stride length, decreased hip/knee flexion- Right, decreased hip/knee flexion- Left, decreased ankle dorsiflexion- Right, decreased ankle dorsiflexion- Left, genu recurvatum- Right, genu recurvatum- Left, trunk flexed, wide BOS, poor foot clearance- Right, and poor foot clearance- Left Distance walked: 115 ft Assistive device utilized:  crocodile posterior RW Level of assistance: Min A Comments: With use of R Ottobock PLS AFO needs min A to stand and pivot to RW from transport chair (with pt's grandma keeping transport chair steady), cues to attend  to RLE and bring it through, pt with improved RLE foot clearance and didn't have as many instances of RLE catching. Cues at times for posture as well   Gait pattern:  see above Distance walked: 130'  Assistive device utilized:   crocodile posterior RW Level of assistance: Min A Comments: Continued with use of R Ottobock PLS AFO, but trialed blue shoe cover to mimic a R leather toe cap, did not notice a significant difference and pt did not feel a difference as well. RLE caught more along the floor with toe cap with pt needing cues to advance limb to prevent it from getting stuck. Pt fatigued after 130', pt's grandma brought transport w/c over to pt and then pt needing min A/CGA to sit back down in chair    Pt continues to do well overall with gait with posterior RW this date with improved posture and gait mechanics compared to regular RW.  Locked backwards mechanism during gait to prevent LOB backwards during ambulation and increased resistance against wheels in order to better control patient's speed during gait.     PATIENT EDUCATION: Education details: See therapeutic activity section, process of obtaining AFO, will re-send order request and that pt might benefit from Custom AFO  Person educated: Patient and Parent Education method: Explanation and Demonstration Education comprehension: verbalized understanding and returned demonstration  HOME EXERCISE PROGRAM: Will provide in future sessions  GOALS: Goals reviewed with patient? Yes  SHORT TERM GOALS: ALL STGS = LTGS   LONG TERM GOALS: Target date: 06/27/2023   Pt will be independent with initial HEP with family support for stretching/ROM and strength as able. Baseline: pt currently with no HEP Goal status: INITIAL  2.  Pt will undergo trials of AFOs to RLE to determine if it will assist with RLE foot clearance during gait.  Baseline: trialed 7/12, recommending Ottobock medial strut (7/12) Goal status: GOAL MET  3.  Pt will  trial posterior rollator/RW to determine improved gait mechanics for household mobility.  Baseline: [employer] to bring one to clinic, trialed 7/12 with min A needed and can benefit from further practice (7/12)  Goal status: GOAL MET  4.  Pt will improve gait speed with RW vs. Posterior RW to at least 1.2 ft/sec in order to demo decr fall risk/improved household mobility.  Baseline: 33.1 seconds with RW = .99 ft/sec  Goal status: INITIAL    ASSESSMENT:  CLINICAL IMPRESSION:  Session limited today due to pt arriving late. Continued to work on gait with posterior walker and R Ottobock PLS AFO (this worked best for pt at last session). Cues to focus on bringing RLE through during gait with CGA needed and min A to stand and turn into posterior walker. Trialed use of blue shoe cover on RLE to see if it would help further with RLE advancement, but no significant difference noted. It seemed to make it harder for pt. Due to time constraints, was unable to get pt set up with stretching HEP. Will do this at next session. Continue POC.    OBJECTIVE IMPAIRMENTS: Abnormal gait, decreased activity tolerance, decreased balance, decreased cognition, decreased coordination, decreased endurance, decreased mobility, difficulty walking, decreased ROM, decreased strength, hypomobility, increased muscle spasms, impaired flexibility, impaired sensation, impaired tone, and postural dysfunction.   ACTIVITY LIMITATIONS: carrying, lifting, bending, standing, squatting, stairs, transfers, bed mobility, bathing, toileting, dressing, reach over head, hygiene/grooming, and locomotion level  PARTICIPATION LIMITATIONS: meal prep, cleaning, driving, shopping, and community activity  PERSONAL FACTORS: Age, Behavior pattern, Past/current experiences, Time since onset of injury/illness/exacerbation, and spastic diplegic CP with b/l LE weakness and spasticity, hx of seizures (not since age 1), Rocker bottom foot deformity   are  also affecting patient's functional outcome.   REHAB POTENTIAL: Fair due to diagnosis  CLINICAL DECISION MAKING: Evolving/moderate complexity  EVALUATION COMPLEXITY: Moderate  PLAN:  PT FREQUENCY: 1x/week  PT DURATION: 8 weeks  PLANNED INTERVENTIONS: Therapeutic exercises, Therapeutic activity, Neuromuscular re-education, Balance training, Gait training, Patient/Family education, Self Care, Joint mobilization, Orthotic/Fit training, DME instructions, Aquatic Therapy, Wheelchair mobility training, Manual therapy, and Re-evaluation  PLAN FOR NEXT SESSION: pt's mom would like to use [employer] - have they gotten it scheduled? Is the AFO order back yet?  Initiate HEP for stretching program.  Pt wants to try using the [employer] if possible   R Ottobock with medial strut vs custom AFO? Pt also with foot deformity so unsure. Pt currently using RW to ambulate, continue to practice with posterior RW   Sherlie Ban, PT, DPT 06/20/23 12:09 PM

## 2023-06-27 ENCOUNTER — Encounter: Payer: Managed Care, Other (non HMO) | Admitting: Physical Medicine and Rehabilitation

## 2023-06-29 ENCOUNTER — Ambulatory Visit: Payer: Managed Care, Other (non HMO) | Admitting: Physical Therapy

## 2023-06-29 DIAGNOSIS — R2689 Other abnormalities of gait and mobility: Secondary | ICD-10-CM

## 2023-06-29 DIAGNOSIS — M21371 Foot drop, right foot: Secondary | ICD-10-CM

## 2023-06-29 DIAGNOSIS — G809 Cerebral palsy, unspecified: Secondary | ICD-10-CM | POA: Diagnosis not present

## 2023-06-29 DIAGNOSIS — R293 Abnormal posture: Secondary | ICD-10-CM

## 2023-06-29 DIAGNOSIS — M6281 Muscle weakness (generalized): Secondary | ICD-10-CM

## 2023-06-29 NOTE — Therapy (Signed)
OUTPATIENT PHYSICAL THERAPY NEURO TREATMENT   Patient Name: Kerri Harris MRN: [id number] DOB:12/24/1990, 32 y.o., female Today's Date: 06/29/2023   PCP: Etta Grandchild, MD  REFERRING PROVIDER: Angelina Sheriff, DO  END OF SESSION:  PT End of Session - 06/29/23 1500     Visit Number 4    Number of Visits 5    Date for PT Re-Evaluation 07/29/23   due to delay in scheduling   Authorization Type [employer]    PT Start Time 1500   pt arrived late   PT Stop Time 1540    PT Time Calculation (min) 40 min    Equipment Utilized During Treatment Gait belt    Activity Tolerance Patient tolerated treatment well    Behavior During Therapy WFL for tasks assessed/performed               Past Medical History:  Diagnosis Date   Cerebral palsy (HCC)    legs stiff do not bend   Eczema    comes and goes per mother  on neck at times uses cocoa butter prn right neck drying out now per mother   Seizures (HCC)    Childhood seizures - not since age 45   Uses walker    in home for ambulation, uses wheelchair for distance   Past Surgical History:  Procedure Laterality Date   feet surgery Bilateral    HEMORRHOID SURGERY N/A 01/22/2018   Procedure: HEMORRHOIDECTOMY SINGLE COLUMN;  Surgeon: Romie Levee, MD;  Location: [facility];  Service: General;  Laterality: N/A;   HIP SURGERY Bilateral yrs ago   KNEE SURGERY Bilateral    REMOVAL OF DRUG DELIVERY IMPLANT Left 03/08/2021   Procedure: REMOVAL OF DRUG DELIVERY IMPLANT (NEXPLANON) FROM  LEFT UPPER ARM;  Surgeon: Willodean Rosenthal, MD;  Location: [facility];  Service: Gynecology;  Laterality: Left;   right wrist surgery     cannot bend at wrist   ROBOTIC ASSISTED TOTAL HYSTERECTOMY Bilateral 03/08/2021   Procedure: XI ROBOTIC ASSISTED TOTAL HYSTERECTOMY WITH SALPINGECTOMY, CYSTOSCOPY;  Surgeon: Willodean Rosenthal, MD;  Location: [facility];  Service: Gynecology;  Laterality: Bilateral;   Patient Active  Problem List   Diagnosis Date Noted   Muscle weakness (generalized) 06/20/2023   Other abnormalities of gait and mobility 06/20/2023   Right foot drop 06/20/2023   Spasticity 03/06/2023   Impaired mobility and ADLs 03/06/2023   Other insomnia 03/06/2023   Implanon in place 11/17/2020   Rocker bottom foot deformity 09/08/2019   Foot pain, bilateral 09/02/2019   Atopic dermatitis 11/14/2017   Encounter for general adult medical examination with abnormal findings 08/07/2017   Cerebral palsy (HCC) 07/20/2016   Needs assistance while at home 06/11/2015    ONSET DATE: 05/23/2023 (date of referral)  REFERRING DIAG: G80.1 (ICD-10-CM) - Spastic diplegic cerebral palsy (HCC) Z74.09,Z78.9 (ICD-10-CM) - Impaired mobility and ADLs  THERAPY DIAG:  Muscle weakness (generalized)  Cerebral palsy, unspecified type (HCC)  Abnormal posture  Other abnormalities of gait and mobility  Right foot drop  Rationale for Evaluation and Treatment: Rehabilitation  SUBJECTIVE:  SUBJECTIVE STATEMENT: ***Pt's grandma reports that patient's mom did hear from [facility] and she has an appointment scheduled for August 8. Pt and family agreeable to add appointments this visit.  Pt accompanied by:  Ennis Forts and brother   PERTINENT HISTORY:  PMH: spastic diplegic CP with b/l LE weakness and spasticity, hx of seizures (not since age 30), Rocker bottom foot deformity   Uses posture control walker >9 years old, wheels aren't working well anymore which makes it hard for her to ambulate.  [employer] never fixed WC   PAIN:  Are you having pain? No "high pain tolerance"   PRECAUTIONS: Fall  WEIGHT BEARING RESTRICTIONS: No  FALLS: Has patient fallen in last 6 months? No Pt's mom reports that if she does have a fall, will be  more like a slide like off the couch   LIVING ENVIRONMENT: Lives with: lives with their family Lives in: House/apartment Stairs: Can live on ground floor, has 1 step - has to hold onto the doorframe and get help from family  Looking into getting a stair lift  Has following equipment at home: Dan Humphreys - 2 wheeled, Wheelchair (manual), shower chair, Grab bars, and has a posture control/posterior walker that is >24 years old   PLOF: Independent with household mobility with device, Needs assistance with ADLs, and for the most part, pt is able to transfer herself.   PATIENT GOALS: Wants to get legs stronger   OBJECTIVE:   COGNITION: Overall cognitive status: History of cognitive impairments - at baseline   SENSATION: Light touch: WFL Reports legs will feel numb at times   COORDINATION: Impaired due to spasticity   MUSCLE TONE:  Significant BLE spasticity to knee extensors, adductors, PFs, without legs held in foot rests by strap, legs held mainly in knee extension   POSTURE: rounded shoulders, forward head, and posterior pelvic tilt  BLE posturing with knees held in extension and feet plantarflexed    LOWER EXTREMITY ROM:    Significantly limited knee flexion bilat due to spasticity   With ankle DF, can reach neutral PROM R>LLE   LOWER EXTREMITY MMT:    MMT Right Eval Left Eval  Hip flexion 2+ 2+  Hip extension    Hip abduction    Hip adduction    Hip internal rotation    Hip external rotation    Knee flexion 2 2  Knee extension 4- 4-  Ankle dorsiflexion Unable to accurately assess Unable to accurately assess  Ankle plantarflexion    Ankle inversion    Ankle eversion    (Blank rows = not tested)  BED MOBILITY:  Pt reports no significant difficulties with this at home.   TRANSFERS: Assistive device utilized: Environmental consultant - 2 wheeled  Sit to stand: CGA Stand to sit: CGA With RW and from pt's w/c (PT having to help steady pt's w/c as brakes don't currently work), pt  pushing up through arms to stand as BLE held out into extension, once pt semi gets into standing, moves hands to RW and slowly walks feet back under her. Pt with incr forward flexed posture in standing at RW.   GAIT: Gait pattern: step to pattern, decreased stance time- Right, decreased stance time- Left, decreased stride length, decreased hip/knee flexion- Right, decreased hip/knee flexion- Left, decreased ankle dorsiflexion- Right, decreased ankle dorsiflexion- Left, Right hip hike, Right foot flat, Left foot flat, trunk flexed, poor foot clearance- Right, and poor foot clearance- Left Distance walked: approx. 40'  Assistive device utilized: Environmental consultant -  2 wheeled Level of assistance: Min A Comments: Pt with significant forward flexed posture over RW and staying outside of its BOS. Pt with catching of R toe with gait with extension and PF. Pt ambulating with incr R > L toe out. No knee flexion with gait    FUNCTIONAL TESTS:  10 meter walk test: 33.1 seconds with RW = .99 ft/sec    TODAY'S TREATMENT:                                                                                                                               Therapeutic Activity:  ***  TherEx  Supine PROM hip flexion and knee flexion, LLE tighter than RLE Quadruped alt UE lifts (diff holding herself up on RLE due to rod in wrist) Quad to tall-kneel Seated knee flexion  Gait/Orthotic Assessment Gait pattern: decreased stride length, decreased hip/knee flexion- Right, decreased hip/knee flexion- Left, decreased ankle dorsiflexion- Right, decreased ankle dorsiflexion- Left, genu recurvatum- Right, genu recurvatum- Left, trunk flexed, wide BOS, poor foot clearance- Right, and poor foot clearance- Left Distance walked: 115 ft Assistive device utilized:  crocodile posterior RW Level of assistance: Min A Comments: With use of R Ottobock PLS AFO needs min A to stand and pivot to RW from transport chair (with pt's grandma keeping  transport chair steady), cues to attend to RLE and bring it through, pt with improved RLE foot clearance and didn't have as many instances of RLE catching. Cues at times for posture as well   Gait pattern:  see above Distance walked: 130'  Assistive device utilized:   crocodile posterior RW Level of assistance: Min A Comments: Continued with use of R Ottobock PLS AFO, but trialed blue shoe cover to mimic a R leather toe cap, did not notice a significant difference and pt did not feel a difference as well. RLE caught more along the floor with toe cap with pt needing cues to advance limb to prevent it from getting stuck. Pt fatigued after 130', pt's grandma brought transport w/c over to pt and then pt needing min A/CGA to sit back down in chair    Pt continues to do well overall with gait with posterior RW this date with improved posture and gait mechanics compared to regular RW.  Locked backwards mechanism during gait to prevent LOB backwards during ambulation and increased resistance against wheels in order to better control patient's speed during gait.     PATIENT EDUCATION: Education details: See therapeutic activity section, process of obtaining AFO, will re-send order request and that pt might benefit from Custom AFO *** Person educated: Patient and Parent Education method: Explanation and Demonstration Education comprehension: verbalized understanding and returned demonstration  HOME EXERCISE PROGRAM: Will provide in future sessions ***  GOALS: Goals reviewed with patient? Yes  SHORT TERM GOALS: ALL STGS = LTGS   LONG TERM GOALS: Target date: 06/27/2023***   Pt will be independent with initial HEP with  family support for stretching/ROM and strength as able. Baseline: pt currently with no HEP Goal status: INITIAL  2.  Pt will undergo trials of AFOs to RLE to determine if it will assist with RLE foot clearance during gait.  Baseline: trialed 7/12, recommending Ottobock medial  strut (7/12) Goal status: GOAL MET  3.  Pt will trial posterior rollator/RW to determine improved gait mechanics for household mobility.  Baseline: [employer] to bring one to clinic, trialed 7/12 with min A needed and can benefit from further practice (7/12)  Goal status: GOAL MET  4.  Pt will improve gait speed with RW vs. Posterior RW to at least 1.2 ft/sec in order to demo decr fall risk/improved household mobility.  Baseline: 33.1 seconds with RW = .99 ft/sec  Goal status: INITIAL    ASSESSMENT:  CLINICAL IMPRESSION: Emphasis of skilled PT session*** Continue POC.     OBJECTIVE IMPAIRMENTS: Abnormal gait, decreased activity tolerance, decreased balance, decreased cognition, decreased coordination, decreased endurance, decreased mobility, difficulty walking, decreased ROM, decreased strength, hypomobility, increased muscle spasms, impaired flexibility, impaired sensation, impaired tone, and postural dysfunction.   ACTIVITY LIMITATIONS: carrying, lifting, bending, standing, squatting, stairs, transfers, bed mobility, bathing, toileting, dressing, reach over head, hygiene/grooming, and locomotion level  PARTICIPATION LIMITATIONS: meal prep, cleaning, driving, shopping, and community activity  PERSONAL FACTORS: Age, Behavior pattern, Past/current experiences, Time since onset of injury/illness/exacerbation, and spastic diplegic CP with b/l LE weakness and spasticity, hx of seizures (not since age 62), Rocker bottom foot deformity   are also affecting patient's functional outcome.   REHAB POTENTIAL: Fair due to diagnosis  CLINICAL DECISION MAKING: Evolving/moderate complexity  EVALUATION COMPLEXITY: Moderate  PLAN:  PT FREQUENCY: 1x/week  PT DURATION: 8 weeks  PLANNED INTERVENTIONS: Therapeutic exercises, Therapeutic activity, Neuromuscular re-education, Balance training, Gait training, Patient/Family education, Self Care, Joint mobilization, Orthotic/Fit training, DME  instructions, Aquatic Therapy, Wheelchair mobility training, Manual therapy, and Re-evaluation  PLAN FOR NEXT SESSION: pt's mom would like to use [employer] - have they gotten it scheduled? Is the AFO order back yet?  Initiate HEP for stretching program.  Pt wants to try using the SciFit if possible   R Ottobock with medial strut vs custom AFO? Pt also with foot deformity so unsure. Pt currently using RW to ambulate, continue to practice with posterior RW***create Hep (standing step taps or hip flexion? Seated knee flexion, quadruped to tall kneel, prone, PROM hip and knee flexion), family to decide if they are ok adding appts to POC; recert   Peter Congo, PT, DPT, CSRS  06/29/23 3:50 PM

## 2023-07-06 ENCOUNTER — Ambulatory Visit: Payer: Managed Care, Other (non HMO) | Attending: Physical Medicine and Rehabilitation | Admitting: Physical Therapy

## 2023-07-06 ENCOUNTER — Encounter: Payer: Self-pay | Admitting: Physical Therapy

## 2023-07-06 DIAGNOSIS — M6281 Muscle weakness (generalized): Secondary | ICD-10-CM | POA: Insufficient documentation

## 2023-07-06 DIAGNOSIS — G809 Cerebral palsy, unspecified: Secondary | ICD-10-CM | POA: Diagnosis present

## 2023-07-06 DIAGNOSIS — M21371 Foot drop, right foot: Secondary | ICD-10-CM | POA: Insufficient documentation

## 2023-07-06 DIAGNOSIS — R293 Abnormal posture: Secondary | ICD-10-CM | POA: Diagnosis present

## 2023-07-06 DIAGNOSIS — R2689 Other abnormalities of gait and mobility: Secondary | ICD-10-CM | POA: Diagnosis present

## 2023-07-06 NOTE — Therapy (Signed)
OUTPATIENT PHYSICAL THERAPY NEURO TREATMENT   Patient Name: Kerri Harris MRN: [id number] DOB:1991/08/30, 32 y.o., female Today's Date: 07/06/2023   PCP: Etta Grandchild, MD  REFERRING PROVIDER: Angelina Sheriff, DO  END OF SESSION:   PT End of Session - 07/06/23 1108     Visit Number 5    Number of Visits 8   recert   Date for PT Re-Evaluation 08/10/23   due to delay in scheduling   Authorization Type [employer]    PT Start Time 1105    PT Stop Time 1145    PT Time Calculation (min) 40 min    Equipment Utilized During Treatment Gait belt    Activity Tolerance Patient tolerated treatment well    Behavior During Therapy WFL for tasks assessed/performed               Past Medical History:  Diagnosis Date   Cerebral palsy (HCC)    legs stiff do not bend   Eczema    comes and goes per mother  on neck at times uses cocoa butter prn right neck drying out now per mother   Seizures (HCC)    Childhood seizures - not since age 58   Uses walker    in home for ambulation, uses wheelchair for distance   Past Surgical History:  Procedure Laterality Date   feet surgery Bilateral    HEMORRHOID SURGERY N/A 01/22/2018   Procedure: HEMORRHOIDECTOMY SINGLE COLUMN;  Surgeon: Romie Levee, MD;  Location: [facility];  Service: General;  Laterality: N/A;   HIP SURGERY Bilateral yrs ago   KNEE SURGERY Bilateral    REMOVAL OF DRUG DELIVERY IMPLANT Left 03/08/2021   Procedure: REMOVAL OF DRUG DELIVERY IMPLANT (NEXPLANON) FROM  LEFT UPPER ARM;  Surgeon: Willodean Rosenthal, MD;  Location: [facility];  Service: Gynecology;  Laterality: Left;   right wrist surgery     cannot bend at wrist   ROBOTIC ASSISTED TOTAL HYSTERECTOMY Bilateral 03/08/2021   Procedure: XI ROBOTIC ASSISTED TOTAL HYSTERECTOMY WITH SALPINGECTOMY, CYSTOSCOPY;  Surgeon: Willodean Rosenthal, MD;  Location: [facility];  Service: Gynecology;  Laterality: Bilateral;   Patient Active Problem List    Diagnosis Date Noted   Muscle weakness (generalized) 06/20/2023   Other abnormalities of gait and mobility 06/20/2023   Right foot drop 06/20/2023   Spasticity 03/06/2023   Impaired mobility and ADLs 03/06/2023   Other insomnia 03/06/2023   Implanon in place 11/17/2020   Rocker bottom foot deformity 09/08/2019   Foot pain, bilateral 09/02/2019   Atopic dermatitis 11/14/2017   Encounter for general adult medical examination with abnormal findings 08/07/2017   Cerebral palsy (HCC) 07/20/2016   Needs assistance while at home 06/11/2015    ONSET DATE: 05/23/2023 (date of referral)  REFERRING DIAG: G80.1 (ICD-10-CM) - Spastic diplegic cerebral palsy (HCC) Z74.09,Z78.9 (ICD-10-CM) - Impaired mobility and ADLs  THERAPY DIAG:  Abnormal posture  Muscle weakness (generalized)  Other abnormalities of gait and mobility  Right foot drop  Rationale for Evaluation and Treatment: Rehabilitation  SUBJECTIVE:  SUBJECTIVE STATEMENT: Has been working on exercises at home with her Grandma. Has an appt next week with [facility] for her brace. Got scheduled for her w/c eval for the end of the month.   Pt accompanied by:  parents  PERTINENT HISTORY:  PMH: spastic diplegic CP with b/l LE weakness and spasticity, hx of seizures (not since age 75), Rocker bottom foot deformity   Uses posture control walker >70 years old, wheels aren't working well anymore which makes it hard for her to ambulate.  [employer] never fixed WC   PAIN:  Are you having pain? No "high pain tolerance"   PRECAUTIONS: Fall  WEIGHT BEARING RESTRICTIONS: No  FALLS: Has patient fallen in last 6 months? No Pt's mom reports that if she does have a fall, will be more like a slide like off the couch   LIVING ENVIRONMENT: Lives with: lives with  their family Lives in: House/apartment Stairs: Can live on ground floor, has 1 step - has to hold onto the doorframe and get help from family  Looking into getting a stair lift  Has following equipment at home: Dan Humphreys - 2 wheeled, Wheelchair (manual), shower chair, Grab bars, and has a posture control/posterior walker that is >90 years old   PLOF: Independent with household mobility with device, Needs assistance with ADLs, and for the most part, pt is able to transfer herself.   PATIENT GOALS: Wants to get legs stronger   OBJECTIVE:   COGNITION: Overall cognitive status: History of cognitive impairments - at baseline   SENSATION: Light touch: WFL Reports legs will feel numb at times   COORDINATION: Impaired due to spasticity   MUSCLE TONE:  Significant BLE spasticity to knee extensors, adductors, PFs, without legs held in foot rests by strap, legs held mainly in knee extension   POSTURE: rounded shoulders, forward head, and posterior pelvic tilt  BLE posturing with knees held in extension and feet plantarflexed    LOWER EXTREMITY ROM:    Significantly limited knee flexion bilat due to spasticity   With ankle DF, can reach neutral PROM R>LLE   LOWER EXTREMITY MMT:    MMT Right Eval Left Eval  Hip flexion 2+ 2+  Hip extension    Hip abduction    Hip adduction    Hip internal rotation    Hip external rotation    Knee flexion 2 2  Knee extension 4- 4-  Ankle dorsiflexion Unable to accurately assess Unable to accurately assess  Ankle plantarflexion    Ankle inversion    Ankle eversion    (Blank rows = not tested)   TODAY'S TREATMENT:                                                                                                                               TherEx Trial of various exercises to see what would be appropriate for patient to perform as HEP: Attempted bridging with therapist holding pt's feet in  limited knee flexed position, pt unable to perform,  attempted glute squeeze, but pt compensating by moving upper body  Supine AAROM heel slides x10 reps each side, more difficulty performing with RLE  Supine AAROM marching x10 reps each side for hip flexor strengthening, pt with knee only slightly flexed when performing, when pt attempts to perform opposite leg on mat table goes into extension and lifts from table  Seated knee flexion EOM with passive over pressure   Will continue to trial various exercises in future sessions to determine what would be appropriate for pt to continue to work on at home.  Gait/Orthotic Assessment Gait pattern: decreased stride length, decreased hip/knee flexion- Right, decreased hip/knee flexion- Left, decreased ankle dorsiflexion- Right, decreased ankle dorsiflexion- Left, genu recurvatum- Right, genu recurvatum- Left, trunk flexed, wide BOS, poor foot clearance- Right, and poor foot clearance- Left Distance walked: 230' x 1  Assistive device utilized:  crocodile posterior RW Level of assistance: Min A Comments: With use of R Ottobock PLS AFO needs min A to stand and pivot to RW from transport chair. Compared to last session, pt did better with foot clearance with R foot with use of AFO and less instances of R foot catching compared to previous session. Pt does need cues to attend to RLE and really focus on picking it up, otherwise it does catch.   Pt to have appt at [facility] next week, likely best option will be a custom AFO.    Pt continues to do well overall with gait with posterior RW this date with improved posture and gait mechanics compared to regular RW.  Locked backwards mechanism during gait to prevent LOB backwards during ambulation and increased resistance against wheels in order to better control patient's speed during gait.     PATIENT EDUCATION: Education details: Continue POC  Person educated: Patient and parents Education method: Medical illustrator Education comprehension: verbalized  understanding and returned demonstration  HOME EXERCISE PROGRAM: Will provide in future sessions   GOALS: Goals reviewed with patient? Yes  SHORT TERM GOALS: ALL STGS = LTGS   LONG TERM GOALS: Target date: 06/27/2023  Pt will be independent with initial HEP with family support for stretching/ROM and strength as able. Baseline: pt currently with no HEP, trialed exercises but due to time constraints no exercises assigned (7/26) Goal status: IN PROGRESS  2.  Pt will undergo trials of AFOs to RLE to determine if it will assist with RLE foot clearance during gait.  Baseline: trialed 7/12, recommending Ottobock medial strut (7/12) Goal status: GOAL MET  3.  Pt will trial posterior rollator/RW to determine improved gait mechanics for household mobility.  Baseline: [employer] to bring one to clinic, trialed 7/12 with min A needed and can benefit from further practice (7/12)  Goal status: GOAL MET  4.  Pt will improve gait speed with RW vs. Posterior RW to at least 1.2 ft/sec in order to demo decr fall risk/improved household mobility.  Baseline: 33.1 seconds with RW = .99 ft/sec; 1.16 ft/sec with posterior RW (7/26)  Goal status: IN PROGRESS  NEW SHORT TERM GOALS=LONG TERM GOALS due to length of POC   NEW LONG TERM GOALS:  Target date: 07/27/2023  Pt will be independent with final HEP with family support for stretching/ROM and strength as able. Baseline: pt currently with no HEP, trialed exercises but due to time constraints no exercises assigned (7/26) Goal status: INITIAL  2.  Pt will improve gait speed with RW vs. Posterior RW  to at least 1.2 ft/sec in order to demo decr fall risk/improved household mobility.  Baseline: 33.1 seconds with RW = .99 ft/sec; 1.16 ft/sec with posterior RW (7/26)  Goal status: IN PROGRESS    ASSESSMENT:  CLINICAL IMPRESSION: Today's skilled session focused on working on gait with posterior walker and use of Ottobock PLS AFO for RLE for improved foot  clearance. Pt demonstrating decr episodes of R toe catching compared to previous session, but pt needs cues throughout to attend to RLE. Pt has appt with [facility] next week for brace. Remainder of session tried to work on [employer] exercises in supine and in sitting for knee flexion for potential HEP. Discussed can try AAROM of hip flexion for strengthening for home with family assist. Will continue per POC.     OBJECTIVE IMPAIRMENTS: Abnormal gait, decreased activity tolerance, decreased balance, decreased cognition, decreased coordination, decreased endurance, decreased mobility, difficulty walking, decreased ROM, decreased strength, hypomobility, increased muscle spasms, impaired flexibility, impaired sensation, impaired tone, and postural dysfunction.   ACTIVITY LIMITATIONS: carrying, lifting, bending, standing, squatting, stairs, transfers, bed mobility, bathing, toileting, dressing, reach over head, hygiene/grooming, and locomotion level  PARTICIPATION LIMITATIONS: meal prep, cleaning, driving, shopping, and community activity  PERSONAL FACTORS: Age, Behavior pattern, Past/current experiences, Time since onset of injury/illness/exacerbation, and spastic diplegic CP with b/l LE weakness and spasticity, hx of seizures (not since age 66), Rocker bottom foot deformity   are also affecting patient's functional outcome.   REHAB POTENTIAL: Fair due to diagnosis  CLINICAL DECISION MAKING: Evolving/moderate complexity  EVALUATION COMPLEXITY: Moderate  PLAN:  PT FREQUENCY: 1x/week  PT DURATION: 8 weeks + 4 weeks (recert)  PLANNED INTERVENTIONS: Therapeutic exercises, Therapeutic activity, Neuromuscular re-education, Balance training, Gait training, Patient/Family education, Self Care, Joint mobilization, Orthotic/Fit training, DME instructions, Aquatic Therapy, Wheelchair mobility training, Manual therapy, and Re-evaluation  PLAN FOR NEXT SESSION: were they able to move w/c appt up? (also would like  to know if both w/c and posterior walker covered or just one?)  Keep working on trying to add exercises to HEP, (standing step taps or hip flexion? Seated knee flexion, quadruped to tall kneel, prone, PROM hip and knee flexion),  Pt wants to try using the SciFit if possible   How did AFO appt go? Pt currently using RW to ambulate, continue to practice with posterior RW  Sherlie Ban, PT, DPT 07/06/23 11:09 AM

## 2023-07-20 ENCOUNTER — Ambulatory Visit: Payer: Managed Care, Other (non HMO) | Admitting: Physical Therapy

## 2023-07-27 ENCOUNTER — Ambulatory Visit: Payer: Managed Care, Other (non HMO) | Admitting: Physical Therapy

## 2023-07-27 DIAGNOSIS — G809 Cerebral palsy, unspecified: Secondary | ICD-10-CM

## 2023-07-27 DIAGNOSIS — R293 Abnormal posture: Secondary | ICD-10-CM

## 2023-07-27 DIAGNOSIS — R2689 Other abnormalities of gait and mobility: Secondary | ICD-10-CM

## 2023-07-27 DIAGNOSIS — M6281 Muscle weakness (generalized): Secondary | ICD-10-CM

## 2023-07-27 DIAGNOSIS — M21371 Foot drop, right foot: Secondary | ICD-10-CM

## 2023-07-27 NOTE — Therapy (Signed)
OUTPATIENT PHYSICAL THERAPY NEURO TREATMENT - DISCHARGE NOTE***   Patient Name: Kerri Harris MRN: [id number] DOB:Dec 11, 1990, 32 y.o., female Today's Date: 07/27/2023   PCP: Etta Grandchild, MD  REFERRING PROVIDER: Angelina Sheriff, DO  PHYSICAL THERAPY DISCHARGE SUMMARY  Visits from Start of Care: 6  Current functional level related to goals / functional outcomes: ***   Remaining deficits: ***   Education / Equipment: ***   Patient agrees to discharge. Patient goals were {OP Goals:25702::"met"}. Patient is being discharged due to {OP Discharge Reasons:25703::"meeting the stated rehab goals."}   END OF SESSION:   PT End of Session - 07/27/23 1447     Visit Number 6    Number of Visits 8   recert   Date for PT Re-Evaluation 08/10/23   due to delay in scheduling   Authorization Type [employer]    PT Start Time 1445    PT Stop Time 1520   d/c   PT Time Calculation (min) 35 min    Equipment Utilized During Treatment Gait belt    Activity Tolerance Patient tolerated treatment well    Behavior During Therapy WFL for tasks assessed/performed                Past Medical History:  Diagnosis Date   Cerebral palsy (HCC)    legs stiff do not bend   Eczema    comes and goes per mother  on neck at times uses cocoa butter prn right neck drying out now per mother   Seizures (HCC)    Childhood seizures - not since age 73   Uses walker    in home for ambulation, uses wheelchair for distance   Past Surgical History:  Procedure Laterality Date   feet surgery Bilateral    HEMORRHOID SURGERY N/A 01/22/2018   Procedure: HEMORRHOIDECTOMY SINGLE COLUMN;  Surgeon: Romie Levee, MD;  Location: [facility];  Service: General;  Laterality: N/A;   HIP SURGERY Bilateral yrs ago   KNEE SURGERY Bilateral    REMOVAL OF DRUG DELIVERY IMPLANT Left 03/08/2021   Procedure: REMOVAL OF DRUG DELIVERY IMPLANT (NEXPLANON) FROM  LEFT UPPER ARM;  Surgeon: Willodean Rosenthal, MD;  Location: [place];  Service: Gynecology;  Laterality: Left;   right wrist surgery     cannot bend at wrist   ROBOTIC ASSISTED TOTAL HYSTERECTOMY Bilateral 03/08/2021   Procedure: XI ROBOTIC ASSISTED TOTAL HYSTERECTOMY WITH SALPINGECTOMY, CYSTOSCOPY;  Surgeon: Willodean Rosenthal, MD;  Location: [facility] [place];  Service: Gynecology;  Laterality: Bilateral;   Patient Active Problem List   Diagnosis Date Noted   Muscle weakness (generalized) 06/20/2023   Other abnormalities of gait and mobility 06/20/2023   Right foot drop 06/20/2023   Spasticity 03/06/2023   Impaired mobility and ADLs 03/06/2023   Other insomnia 03/06/2023   Implanon in place 11/17/2020   Rocker bottom foot deformity 09/08/2019   Foot pain, bilateral 09/02/2019   Atopic dermatitis 11/14/2017   Encounter for general adult medical examination with abnormal findings 08/07/2017   Cerebral palsy (HCC) 07/20/2016   Needs assistance while at home 06/11/2015    ONSET DATE: 05/23/2023 (date of referral)  REFERRING DIAG: G80.1 (ICD-10-CM) - Spastic diplegic cerebral palsy (HCC) Z74.09,Z78.9 (ICD-10-CM) - Impaired mobility and ADLs  THERAPY DIAG:  Abnormal posture  Muscle weakness (generalized)  Other abnormalities of gait and mobility  Right foot drop  Cerebral palsy, unspecified type (HCC)  Rationale for Evaluation and Treatment: Rehabilitation  SUBJECTIVE:  SUBJECTIVE STATEMENT: Pt reports she did have her appointment at [facility], will get her custom AFO at the end of September. Pt also has been scheduled for her w/c eval next Friday.  Pt accompanied by:  dad  PERTINENT HISTORY:  PMH: spastic diplegic CP with b/l LE weakness and spasticity, hx of seizures (not since age 54), Rocker bottom foot deformity   Uses  posture control walker >43 years old, wheels aren't working well anymore which makes it hard for her to ambulate.  [employer] never fixed WC   PAIN:  Are you having pain? No "high pain tolerance"   PRECAUTIONS: Fall  WEIGHT BEARING RESTRICTIONS: No  FALLS: Has patient fallen in last 6 months? No Pt's mom reports that if she does have a fall, will be more like a slide like off the couch   LIVING ENVIRONMENT: Lives with: lives with their family Lives in: House/apartment Stairs: Can live on ground floor, has 1 step - has to hold onto the doorframe and get help from family  Looking into getting a stair lift  Has following equipment at home: Dan Humphreys - 2 wheeled, Wheelchair (manual), shower chair, Grab bars, and has a posture control/posterior walker that is >43 years old   PLOF: Independent with household mobility with device, Needs assistance with ADLs, and for the most part, pt is able to transfer herself.   PATIENT GOALS: Wants to get legs stronger   OBJECTIVE:   COGNITION: Overall cognitive status: History of cognitive impairments - at baseline   SENSATION: Light touch: WFL Reports legs will feel numb at times   COORDINATION: Impaired due to spasticity   MUSCLE TONE:  Significant BLE spasticity to knee extensors, adductors, PFs, without legs held in foot rests by strap, legs held mainly in knee extension   POSTURE: rounded shoulders, forward head, and posterior pelvic tilt  BLE posturing with knees held in extension and feet plantarflexed    LOWER EXTREMITY ROM:    Significantly limited knee flexion bilat due to spasticity   With ankle DF, can reach neutral PROM R>LLE   LOWER EXTREMITY MMT:    MMT Right Eval Left Eval  Hip flexion 2+ 2+  Hip extension    Hip abduction    Hip adduction    Hip internal rotation    Hip external rotation    Knee flexion 2 2  Knee extension 4- 4-  Ankle dorsiflexion Unable to accurately assess Unable to accurately assess  Ankle  plantarflexion    Ankle inversion    Ankle eversion    (Blank rows = not tested)   TODAY'S TREATMENT:                                                                                                                               TherEx Trial of various exercises to see what would be appropriate for patient to perform as HEP: Attempted bridging with therapist holding pt's feet in limited knee  flexed position, pt unable to perform, attempted glute squeeze, but pt compensating by moving upper body  Supine AAROM heel slides x10 reps each side, more difficulty performing with RLE  Supine AAROM marching x10 reps each side for hip flexor strengthening, pt with knee only slightly flexed when performing, when pt attempts to perform opposite leg on mat table goes into extension and lifts from table  Seated knee flexion EOM with passive over pressure   Will continue to trial various exercises in future sessions to determine what would be appropriate for pt to continue to work on at home.  Gait/Orthotic Assessment Gait pattern: decreased stride length, decreased hip/knee flexion- Right, decreased hip/knee flexion- Left, decreased ankle dorsiflexion- Right, decreased ankle dorsiflexion- Left, genu recurvatum- Right, genu recurvatum- Left, trunk flexed, wide BOS, poor foot clearance- Right, and poor foot clearance- Left Distance walked: 230' x 1  Assistive device utilized:  crocodile posterior RW Level of assistance: Min A Comments: With use of R Ott[place]ock PLS AFO needs min A to stand and pivot to RW from transport chair. Compared to last session, pt did better with foot clearance with R foot with use of AFO and less instances of R foot catching compared to previous session. Pt does need cues to attend to RLE and really focus on picking it up, otherwise it does catch.   Pt to have appt at [facility] next week, likely best option will be a custom AFO.    Pt continues to do well overall with gait with posterior  RW this date with improved posture and gait mechanics compared to regular RW.  Locked backwards mechanism during gait to prevent LOB backwards during ambulation and increased resistance against wheels in order to better control patient's speed during gait.   Gait pattern: {gait characteristics:25376} Distance walked: *** Assistive device utilized: {Assistive devices:23999} Level of assistance: {Levels of assistance:24026} Comments: ***     PATIENT EDUCATION: Education details: Continue POC *** Person educated: Patient and parents Education method: Medical illustrator Education comprehension: verbalized understanding and returned demonstration  HOME EXERCISE PROGRAM: Will provide in future sessions   GOALS: Goals reviewed with patient? Yes  SHORT TERM GOALS: ALL STGS = LTGS   LONG TERM GOALS: Target date: 06/27/2023  Pt will be independent with initial HEP with family support for stretching/ROM and strength as able. Baseline: pt currently with no HEP, trialed exercises but due to time constraints no exercises assigned (7/26) Goal status: IN PROGRESS  2.  Pt will undergo trials of AFOs to RLE to determine if it will assist with RLE foot clearance during gait.  Baseline: trialed 7/12, recommending Ottobock medial strut (7/12) Goal status: GOAL MET  3.  Pt will trial posterior rollator/RW to determine improved gait mechanics for household mobility.  Baseline: [employer] to bring one to clinic, trialed 7/12 with min A needed and can benefit from further practice (7/12)  Goal status: GOAL MET  4.  Pt will improve gait speed with RW vs. Posterior RW to at least 1.2 ft/sec in order to demo decr fall risk/improved household mobility.  Baseline: 33.1 seconds with RW = .99 ft/sec; 1.16 ft/sec with posterior RW (7/26)  Goal status: IN PROGRESS  NEW SHORT TERM GOALS=LONG TERM GOALS due to length of POC   NEW LONG TERM GOALS:  Target date: 07/27/2023***  Pt will be independent  with final HEP with family support for stretching/ROM and strength as able. Baseline: pt currently with no HEP, trialed exercises but due to time constraints  no exercises assigned (7/26) Goal status: INITIAL  2.  Pt will improve gait speed with RW vs. Posterior RW to at least 1.2 ft/sec in order to demo decr fall risk/improved household mobility.  Baseline: 33.1 seconds with RW = .99 ft/sec; 1.16 ft/sec with posterior RW (7/26)  Goal status: IN PROGRESS    ASSESSMENT:  CLINICAL IMPRESSION: Emphasis of skilled PT session*** Continue POC.     OBJECTIVE IMPAIRMENTS: Abnormal gait, decreased activity tolerance, decreased balance, decreased cognition, decreased coordination, decreased endurance, decreased mobility, difficulty walking, decreased ROM, decreased strength, hypomobility, increased muscle spasms, impaired flexibility, impaired sensation, impaired tone, and postural dysfunction.   ACTIVITY LIMITATIONS: carrying, lifting, bending, standing, squatting, stairs, transfers, bed mobility, bathing, toileting, dressing, reach over head, hygiene/grooming, and locomotion level  PARTICIPATION LIMITATIONS: meal prep, cleaning, driving, shopping, and community activity  PERSONAL FACTORS: Age, Behavior pattern, Past/current experiences, Time since onset of injury/illness/exacerbation, and spastic diplegic CP with b/l LE weakness and spasticity, hx of seizures (not since age 79), Rocker bottom foot deformity   are also affecting patient's functional outcome.   REHAB POTENTIAL: Fair due to diagnosis  CLINICAL DECISION MAKING: Evolving/moderate complexity  EVALUATION COMPLEXITY: Moderate  PLAN:  PT FREQUENCY: 1x/week  PT DURATION: 8 weeks + 4 weeks (recert)  PLANNED INTERVENTIONS: Therapeutic exercises, Therapeutic activity, Neuromuscular re-education, Balance training, Gait training, Patient/Family education, Self Care, Joint mobilization, Orthotic/Fit training, DME instructions, Aquatic  Therapy, Wheelchair mobility training, Manual therapy, and Re-evaluation  PLAN FOR NEXT SESSION: were they able to move w/c appt up? (also would like to know if both w/c and posterior walker covered or just one?)  Keep working on trying to add exercises to HEP, (standing step taps or hip flexion? Seated knee flexion, quadruped to tall kneel, prone, PROM hip and knee flexion),  Pt wants to try using the [employer] if possible  How did AFO appt go? Pt currently using RW to ambulate, continue to practice with posterior RW***  Peter Congo, PT, DPT, CSRS  07/27/23 3:25 PM

## 2023-08-03 ENCOUNTER — Ambulatory Visit: Payer: Managed Care, Other (non HMO) | Admitting: Physical Therapy

## 2023-08-03 ENCOUNTER — Ambulatory Visit: Payer: Managed Care, Other (non HMO)

## 2023-08-03 ENCOUNTER — Other Ambulatory Visit: Payer: Self-pay

## 2023-08-03 DIAGNOSIS — M21371 Foot drop, right foot: Secondary | ICD-10-CM

## 2023-08-03 DIAGNOSIS — M6281 Muscle weakness (generalized): Secondary | ICD-10-CM

## 2023-08-03 DIAGNOSIS — R2689 Other abnormalities of gait and mobility: Secondary | ICD-10-CM

## 2023-08-03 DIAGNOSIS — G809 Cerebral palsy, unspecified: Secondary | ICD-10-CM

## 2023-08-03 DIAGNOSIS — R293 Abnormal posture: Secondary | ICD-10-CM | POA: Diagnosis not present

## 2023-08-03 NOTE — Therapy (Addendum)
OUTPATIENT PHYSICAL THERAPY WHEELCHAIR EVALUATION   Patient Name: Kerri Harris MRN: [id number] DOB:[birth year], 32 y.o., female Today's Date: 08/03/2023  END OF SESSION:  PT End of Session - 08/03/23 0937     Visit Number 1    Number of Visits 1    PT Start Time 0935    PT Stop Time 1015    PT Time Calculation (min) 40 min    Equipment Utilized During Treatment Gait belt    Activity Tolerance Patient tolerated treatment well    Behavior During Therapy WFL for tasks assessed/performed             Past Medical History:  Diagnosis Date   Cerebral palsy (HCC)    legs stiff do not bend   Eczema    comes and goes per mother  on neck at times uses cocoa butter prn right neck drying out now per mother   Seizures (HCC)    Childhood seizures - not since age 32   Uses walker    in home for ambulation, uses wheelchair for distance   Past Surgical History:  Procedure Laterality Date   feet surgery Bilateral    HEMORRHOID SURGERY N/A 01/22/2018   Procedure: HEMORRHOIDECTOMY SINGLE COLUMN;  Surgeon: Romie Levee, MD;  Location: [facility];  Service: General;  Laterality: N/A;   HIP SURGERY Bilateral yrs ago   KNEE SURGERY Bilateral    REMOVAL OF DRUG DELIVERY IMPLANT Left 03/08/2021   Procedure: REMOVAL OF DRUG DELIVERY IMPLANT (NEXPLANON) FROM  LEFT UPPER ARM;  Surgeon: Willodean Rosenthal, MD;  Location: [facility];  Service: Gynecology;  Laterality: Left;   right wrist surgery     cannot bend at wrist   ROBOTIC ASSISTED TOTAL HYSTERECTOMY Bilateral 03/08/2021   Procedure: XI ROBOTIC ASSISTED TOTAL HYSTERECTOMY WITH SALPINGECTOMY, CYSTOSCOPY;  Surgeon: Willodean Rosenthal, MD;  Location: [facility];  Service: Gynecology;  Laterality: Bilateral;   Patient Active Problem List   Diagnosis Date Noted   Muscle weakness (generalized) 06/20/2023   Other abnormalities of gait and mobility 06/20/2023   Right foot drop 06/20/2023   Spasticity  03/06/2023   Impaired mobility and ADLs 03/06/2023   Other insomnia 03/06/2023   Implanon in place 11/17/2020   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)  Rationale for Evaluation and Treatment Rehabilitation  SUBJECTIVE:  SUBJECTIVE STATEMENT: Pt presents for wheelchair evaluation. Has a [employer] wheelchair and reports the brakes and foot rest is not working and does not fit her like it used to. Has been >5 years since pt got a new chair. Does not use her wheelchair inside, also uses 2 wheeled RW. Drags her feet when she walks and does not pick them up, has a Rocker bottom foot deformity which causes her to walk on the side of her foot. Hx of broken R wrist with fusion which restricts her flexion/extension.  PRECAUTIONS: Fall  RED FLAGS: None  WEIGHT BEARING RESTRICTIONS No    OCCUPATION: disability  PLOF:  Needs assistance with ADLs and Needs assistance with homemaking  PATIENT GOALS: get an updated chair         MEDICAL HISTORY:  Primary diagnosis onset: 06/04/2023- date of referral     Medical Diagnosis with ICD-10 code: G80.1 (ICD-10-CM) - Spastic diplegic cerebral palsy (HCC) Z74.09,Z78.9 (ICD-10-CM) - Impaired mobility and ADLs R25.2 (ICD-10-CM) - Spasticity   [] Progressive disease  Relevant future surgeries:     Height: 4\' 11"  Weight: 103 lbs  Explain recent changes or trends in weight:      History:  Past Medical History:  Diagnosis Date   Cerebral palsy (HCC)    legs stiff do not bend   Eczema    comes and goes per mother  on neck at times uses cocoa butter prn right neck drying out now per mother   Seizures (HCC)    Childhood seizures - not since age 32   Uses walker    in home for ambulation, uses wheelchair for distance       Cardio Status:  Functional Limitations:   [x] Intact  []  Impaired      Respiratory Status:  Functional Limitations:   [x] Intact  [] Impaired   [] SOB [] COPD [] O2 Dependent ______LPM  [] Ventilator Dependent  Resp equip:                                                     Objective Measure(s):   Orthotics:   [] Amputee:                                                             [] Prosthesis:        HOME ENVIRONMENT:  [x] House [] Condo/town home [] Apartment [] Asst living [] LTCF         [] Own  [] Rent   [] Lives alone [x] Lives with others -  mom, husband, brother                          Hours without assistance: 0 hours  [] Home is accessible to patient                                 Storage of wheelchair:  [x] In home   [] Other Comments:        COMMUNITY :  TRANSPORTATION:  [x] Car [] Van [] Public Transportation [] Adapted w/c Lift []  Ambulance [] Other:                     []   Sits in wheelchair during transport   Where is w/c stored during transport?  [] [place] [place]  []  [employer]  r   [] Self-Driver       Drive while in  Biomedical scientist [] yes [x] no   Employment and/or school:  Specific requirements pertaining to mobility        Other:  COMMUNICATION:  Verbal Communication  [x] WFL [] receptive [] WFL [] expressive [] Understandable  [] Difficult to understand  [] non-communicative  Primary Language:____English__________ 2nd:_____________  Communication provided by:[x] Patient [x] Family [] Caregiver [] Translator   [] Uses an Paramedic device     Manufacturer/Model :                                                                 MOBILITY/BALANCE:  Sitting Balance  Standing Balance  Transfers  Ambulation   [x] WFL      [] WFL  [] Independent  []  Independent   [] Uses UE for balance in sitting Comments:  [x] Uses UE/device for stability Comments:  [x]  Min assist  []  Ambulates independently with       device:___________________      []  Mod assist  [x]  Able to ambulate __230____ feet        safely/functionally/independently   []  Min assist  []  Min assist  []  Max assist  []  Non-functional ambulator         History/High risk of falls   []  Mod assist  []  Mod assist  []  Dependent  []  Unable to ambulate   []  Max  assist  []  Max assist  Transfer method:[] 1 person [] 2 person [] sliding board [] squat pivot [x] stand pivot [] mechanical patient lift  [] other:   []  Unable  []  Unable    Fall History: # of falls in the past 6 months? 0 # of "near" falls in the past 6 months? 0    CURRENT SEATING / MOBILITY:  Current Mobility Device: [] None [x] Cane/Walker [x] Manual [] Dependent [] Dependent w/ Tilt rScooter  [] Power (type of control):   Manufacturer: tilite Model: Aero X Serial #:   Size:  Color:  Age: >6 years  Purchased by whom: [employer]  Current condition of mobility base:    Current seating system:       Acta embrace                                                                Age of seating system:  6+  Describe posture in present seating system:    Is the current mobility meeting medical necessity?:  [] Yes [x] No Describe: Brakes are not working, tires are worn, casters are worn, not supporting posture or back .                                    Ability to complete Mobility-Related Activities of Daily Living (MRADL's) with Current Mobility Device:   Move room to room  [x] Independent  [] Min [] Mod [] Max assist  [] Unable  Comments:   Meal prep  [] Independent  [] Min [x] Mod [] Max assist  [] Unable    Feeding  [  x]Independent  [] Min [] Mod [] Max assist  [] Unable    Bathing  [] Independent  [x] Min  [] Mod [] Max assist  [] Unable    Grooming  [] Independent  [x] Min [] Mod [] Max assist  [] Unable    UE dressing  [x] Independent  [] Min [] Mod [] Max assist  [] Unable    LE dressing  [] Independent   [x] Min [] Mod [] Max assist  [] Unable    Toileting  [] Independent  [x] Min [] Mod [] Max assist  [] Unable    Bowel Mgt: []  Continent []  Incontinent [x]  Accidents []  Diapers []  Colostomy []  Bowel Program:  Bladder Mgt: []  Continent []  Incontinent [x]  Accidents []  Diapers []  Urinal []  Intermittent Cath []  Indwelling Cath []  Supra-pubic Cath     Current Mobility Equipment Trialed/ Ruled Out:    Does not meet mobility needs due to:    Mark all boxes that indicate inability to use the specific equipment listed     Meets needs for safe  independent functional  ambulation  / mobility    Risk of  Falling or History of Falls    Enviromental limitations      Cognition    Safety concerns with  physical ability    Decreased / limitations endurance  & strength     Decreased / limitations  motor skills  & coordination    Pain    Pace /  Speed    Cardiac and/or  respiratory condition    Contra - indicated by diagnosis   Cane/Crutches  []   []   []   []   []   []   []   []   []   []   []    Walker / Rollator  []  NA   []   [x]   []   []   []   [x]   [x]   []   [x]   []   []     Manual Wheelchair [id number]:  []  NA  [x]   []   []   []   []   []   []   []   []   []   []    Manual W/C (K0005) with power assist  []  NA  []   []   []   []   []   []   []   []   []   []   []    Scooter  []  NA  []   []   []   []   []   []   []   []   []   []   []    Power Wheelchair: standard joystick  []  NA  []   []   []   []   []   []   []   []   []   []   []    Power Wheelchair: alternative controls  []  NA  []   []   []   []   []   []   []   []   []   []   []    Summary:  The least costly alternative for independent functional mobility was found to be:    []  Crutch/Cane  []  Walker [x]  Manual w/c  []  Manual w/c with power assist   []  Scooter   []  Power w/c std joystick   []  Power w/c alternative  control        []  Requires dependent care mobility device   Cabin crew for [employer] skills are adequate for safe mobility equipment operation  [x]   Yes []   No  Patient is willing and motivated to use recommended mobility equipment  [x]   Yes []   No       []  Patient is unable to safely operate mobility equipment independently and requires dependent care equipment Comments:  SENSATION and SKIN ISSUES:  Sensation [x]  Intact  []  Impaired []  Absent []  Hyposensate []  Hypersensate  []  Defensiveness  Location(s) of impairment:    Pressure Relief Method(s):  [x]  Lean side to side to offload (without risk of falling)  [x]   W/C push up (4+ times/hour for 15+ seconds) [x]  Stand up (without risk of falling)    []  Other: (Describe): Effective pressure relief method(s) above can be performed consistently throughout the day: [x] Yes  []  No If not, Why?:  Skin Integrity Risk:       [x]  Low risk           []  Moderate risk            []  High risk  If high risk, explain:   Skin Issues/Skin Integrity  Current skin Issues  []  Yes [x]  No [x]  Intact  []   Red area   []   Open area  []  Scar tissue  []  At risk from prolonged sitting  Where: History of Skin Issues  []  Yes [x]  No Where : When: Stage: Hx of skin flap surgeries  []  Yes []  No Where:  When:  Pain: []  Yes [x]  No   Pain Location(s):  Intensity scale: (0-10) : How does pain interfere with mobility and/or MRADLs? -         MAT EVALUATION:  Neuro-Muscular Status: (Tone, Reflexive, Responses, etc.)     []   Intact   [x]  Spasticity: bil LE with increased tone in calves, quads, hip adductors, hamstrings []  Hypotonicity  []  Fluctuating  []  Muscle Spasms  []  Poor Righting Reactions/Poor Equilibrium Reactions  []  Primal Reflex(s):    Comments:            COMMENTS:    POSTURE:     Comments:  Pelvis Anterior/Posterior:  [x]  Neutral   []  Posterior  []  Anterior  []  Fixed - No  movement []  Tendency away from neutral []  Flexible []  Self-correction []  External correction Obliquity (viewed from front)  [x]  WFL []  R Obliquity []  L Obliquity  []  Fixed - No movement []  Tendency away from neutral []  Flexible []  Self-correction []  External correction Rotation  [x]  WFL []  R anterior []  L anterior  []  Fixed - No movement []  Tendency away from neutral []  Flexible []  Self-correction []  External correction Tonal Influence Pelvis:  []  Normal []  Flaccid [x]  Low tone []  Spasticity []  Dystonia []  Pelvis thrust []  Other:    Trunk Anterior/Posterior:  []  WFL []  Thoracic kyphosis []  Lumbar lordosis  []  Fixed - No movement [x]  Tendency away from neutral- tends to the right side [x]  Flexible [x]  Self-correction [x]  External correction  [x]  WFL []  Convex to left  []  Convex to right []  S-curve   []  C-curve []  Multiple curves []  Tendency away from neutral []  Flexible []  Self-correction []  External correction Rotation of shoulders and upper trunk:  [x]  Neutral []  Left-anterior []  Right- anterior []  Fixed- no movement []  Tendency away from neutral []  Flexible []  Self correction []  External correction Tonal influence Trunk:  []  Normal []  Flaccid [x]  Low tone []  Spasticity []  Dystonia []  Other:   Head & Neck  [x]  Functional []  Flexed    []  Extended []  Rotated right  []  Rotated left []  Laterally flexed right []  Laterally flexed left []  Cervical hyperextension   [x]  Good head control []  Adequate head control []  Limited head control []  Absent head control Describe tone/movement of head and neck:      Lower Extremity Measurements: LE ROM:  unable to measure due to significant tone  Active ROM Right 08/03/2023 Left 08/03/2023  Hip flexion    Hip extension    Hip abduction    Hip adduction    Knee flexion    Knee extension    Ankle dorsiflexion    Ankle plantarflexion     (Blank rows = not tested)  LE MMT: unable to measure due  to significant tone  MMT Right 08/03/2023 Left 08/03/2023  Hip flexion    Hip extension    Hip abduction    Hip adduction    Knee flexion    Knee extension    Ankle dorsiflexion    Ankle plantarflexion     (Blank rows = not tested)  Hip positions:  []  Neutral   []  Abducted   [x]  Adducted  []  Subluxed   []  Dislocated   [x]  Fixed   [x]  Tendency away from neutral []  Flexible []  Self-correction []  External correction   Hip Windswept:[]  Neutral  []  Right    [x]  Left  []  Subluxed   []  Dislocated   [x]  Fixed   [x]  Tendency away from neutral []  Flexible []  Self-correction []  External correction  LE Tone: []  Normal []  Low tone [x]  Spasticity []  Flaccid []  Dystonia []  Rocks/Extends at hip []  Thrust into knee extension []  Pushes legs downward into footrest  Foot positioning: ROM Concerns: Dorsiflexed: []  Right   []  Left Plantar flexed: [x]  Right    [x]  Left Inversion: []  Right    []  Left Eversion: []  Right    []  Left  LE Edema: [x]  1+ (Barely detectable impression when finger is pressed into skin) []  2+ (slight indentation. 15 seconds to rebound) []  3+ (deeper indentation. 30 seconds to rebound) []  4+ (>30 seconds to rebound)  UE Measurements:  UPPER EXTREMITY ROM:   Active ROM Right 08/03/2023 Left 08/03/2023  Shoulder flexion    Shoulder abduction    Shoulder adduction    Elbow flexion    Elbow extension    Wrist flexion    Wrist extension    (Blank rows = not tested)  UPPER EXTREMITY MMT:  MMT Right 08/03/2023 Left 08/03/2023  Shoulder flexion    Shoulder abduction    Shoulder adduction    Elbow flexion    Elbow extension    Wrist flexion    Wrist extension    Pinch strength    Grip strength    (Blank rows = not tested)  Shoulder Posture:  Right Tendency towards Left  []   Functional []    [x]   Elevation [x]    []   Depression []    []   Protraction []    []   Retraction []    []   Internal rotation []    []   External rotation []    []   Subluxed []      UE Tone: [x]  Normal []  Flaccid []  Low tone []  Spasticity  []  Dystonia []  Other:   UE Edema: [x]  1+ (Barely detectable impression when finger is pressed into skin) []  2+ (slight indentation. 15 seconds to rebound) []  3+ (deeper indentation. 30 seconds to rebound) []  4+ (>30 seconds to rebound)  Wrist/Hand: Handedness: []  Right   []  Left   []  NA: Comments:  Right  Left  []   WNL [x]    [x]   Limitations []    []   Contractures []    []   Fisting []    []   Tremors []    []   Weak grasp []    []   Poor dexterity []    []   Hand movement non functional []    []   Paralysis []         MOBILITY BASE RECOMMENDATIONS and JUSTIFICATION:  MOBILITY BASE  JUSTIFICATION   Manufacturer:   [employer] Model:      A6                        Color: Pink Pearl Seat Width:  16" Seat Depth 18"   [x]  Manual mobility base (continue below)   []  Scooter/POV  []  Power mobility base   Number of hours per day spent in above selected mobility base: 4-5 hours  Typical daily mobility base use Schedule: using wheelchair when she is tired with using walker at home   [x]  is not a safe, functional ambulator  [x]  limitation prevents from completing a MRADL(s) within a reasonable time frame    [x]  limitation places at high risk of morbidity or mortality secondary to  the attempts to perform a    MRADL(s)  []  limitation prevents accomplishing a MRADL(s) entirely  [x]  provide independent mobility  [x]  equipment is a lifetime medical need  [x]  walker or cane inadequate  []  any type manual wheelchair      inadequate  []  scooter/POV inadequate      []  requires dependent mobility          MANUAL MOBILITY      []  Standard manual wheelchair  K0001      Arm:    []  both []  right  []  left      Foot:   []  both []  right   []  left  []  self-propels wheelchair  []  will use on regular basis  []  chair fits throughout home  []  willing and motivated to use  []  propels with assistance     []  dependent use   []   Standard hemi-manual wheelchair  K0002      Arm:    []  both []  right  []  left      Foot:   []  both []  right   []  left  []  lower seat height required to foot propel  []  short stature  []  self-propels wheelchair  []  will use on regular basis  []  chair fits throughout home  []  willing and motivated to use   []  propels with assistance  []  dependent use   []  Lightweight manual wheelchair  K0003      Arm:    []  both []  right  []  left      Foot:   []  both  []  right  []  left                   []  hemi height required  []  medical condition and weight of  wheelchair affect ability to self      propel standard manual wheelchair in the residence  []  can and does self-propel (marginal propulsion skills)  []  daily use _________hours  []  chair fits throughout home  []  willing and motivated to use  []  lower seat height required to foot propel  []  short stature   []  High strength lightweight manual  wheelchair (Breezy Ultra 4)  K0004     Arm:    []  both []  right  []  left     Foot:   []  both []  right   []  left                                                                  []   hemi height required []  medical condition and weight of wheelchair affect ability to self propel while engaging in frequent MRADL(s) that cannot be performed in a standard or lightweight manual wheelchair  []  daily use _________hours  []  chair fits throughout home  []  willing and motivated to use  []  prevent repetitive use injuries   []  lower seat height required to foot propel  []  short stature    [x]  Ultra-lightweight manual wheelchair  K0005     Arm:    [x]  both []  right  []  left     Foot:   [x]  both []  right  []  left       []  hemi height required  []  heavy duty    Front seat to floor ___18"__ inches      Rear seat to floor __17"___ inches      Back height ___19.5"__ inches     Back angle __adjustable____ degrees      Front angle __60 ___ degrees  [x]   full-time manual wheelchair user  [x]  Requires  individualized fitting and optimal adjustments for multiple features that include adjustable axle configuration, fully adjustable center of gravity, wheel camber, seat and back angle, angle of seat slope, which cannot be accommodated by a K0001 through K0004 manual wheelchair  [x]  prevent repetitive use injuries  [x]  daily use___4-5______hours   [x]  user has high activity patterns that frequently require  them  to go out into the community for the purpose of independently accomplishing high level MRADL activities. Examples of these might include a combination of; shopping, work, school, Photographer, childcare, independently loading and unloading from a vehicle etc.  []  lower seat height required to foot propel  []  short stature  []  heavy duty -  weight over 250lbs   [x]  Current chair is a K0005   manufacture:_____Tilite_____________  model:______Aero X___________  serial#____________________  age:____6+_____    []  First time [id number] user (complete trial)  K0004 time and # of strokes to propel 30 feet: ________seconds _________strokes  O9629 time and # of strokes to propel 30 feet: ________seconds _________strokes  What was the result of the trial between the K0004 and K0005 manual wheelchair? ___    What features of the K0005 w/c are needed as compared to the K0004 base? Why?___    [x]  adjustable seat and back angle changes the angle of seat slope of the frame to attain a gravity assisted position for efficient propulsion and proper weight distribution along the frame     [x]  the front of the wheelchair will be configured higher than the back of the chair to allow gravity to assist the user with postural stability  [x]  the center of the wheel will be positioned for stability, safety and efficient propulsion  [x]  adjustable axle allows for vertical, horizontal, camber and overall width changes  throughout the wheels for adjustment of the client's exact needs and abilities.   [x]  adjustable axle  increases the stability and function of the chair allowing for adjustment of the center of gravity.   [x]  accommodates the client's anatomical position in the chair maximizing independence in mobility and maneuverability in all environments.   []  create a minimal fixed tilt-in space to assist in positioning.   [x]  Describe users full-time manual wheelchair activity patterns:_pt currently is using 65+ year old posterior walker which is not working as well. Patient is able to ambulate using posterior walker for short distances but does report fatigue with bil UE as she primarily uses bil UE for support  on walker for WB as her LE have significant tone bil. Pt uses manual wheelchair to conserve energy and manage fatigue and soreness in her arms. Pt uses wheelchair when going in community with family.    []  Power assist Comments:  []  prevent repetitive use injuries  []  repetitive strain injury present in    shoulder girdle    []  shoulder pain is (> or =) to 7/10     during manual propulsion       Current Pain _____/10  []  requires conservation of energy to participate in MRADL(s) runable to propel up ramps or curbs using manual wheelchair  []  been K0005 user greater than one year  []  user unwilling to use power      wheelchair (reason): []  less expensive option to power   wheelchair   []  rim activated power assist -      decreased strength   []  Heavy duty manual wheelchair       K0006     Arm:    []  both []  right  []  left     Foot:   []  both []  right  []  left     []  hemi height required    []  Dependent base  []  user exceeds 250lbs  []  non-functional ambulator    []  extreme spasticity  []  over active movement   []  broken frame/hx of repeated     repairs  []  able to self-propel in residence       []  lower seat to floor height required  []  unable to self-propel in residence   []  Extra heavy duty manual wheelchair  K0007     Arm:    []  both []  right  []  left     Foot:   []  both []  right  []   left     []  hemi height required  []  Dependent base  []  user exceeds 300lbs  []  non-functional ambulator    []  able to self-propel in residence   []  lower seat to floor height required  []  unable to self-propel in residence     []  Manual wheelchair with tilt [phone]      (Manual "Tilt-n-Space")  []  patient is dependent for transfers  []  patient requires frequent       positioning for pressure relief   []  patient requires frequent      positioning for poor/absent trunk control        []  Stroller Base  []  infant/child   []  unable to propel manual      wheelchair  []  allows for growth  []  non-functional ambulator  []  non-functional UE  []  independent mobility is not a goal at this time    MANUAL FRAME OPTIONS      Push handles  []  extended   []  angle adjustable   [x]  standard  []  caregiver access  []  caregiver assist    []  allows "hooking" to enable      increased ability to perform ADLs or maintain balance   [x]  Angle Adjustable Back  [x]  postural control  [x]  control of tone/spasticity  []  accommodation of range of motion  [x]  UE functional control  [x]  accommodation for seating system    Rear wheel placement  []  std/fixed  [x] fully adjustable [x]  camber ____3"____degree  [x]  removable rear wheel  []  non-removable rear wheel  Wheel size __24"_____  Wheel style_____Spoke with pneumatic solids_  [x]  improved UE access to wheels  [x]  increase propulsion ability  [x]  improved stability  [  x] changing angle in space for      improvement of postural stability  [x]  remove for transport    [x]  allow for seating system to fit on  base  []  amputee placement  []  1-arm drive access   r R  r L  []  enable propulsion of manual       wheelchair with one arm    []  amputee placement   Wheel rims/ Hand rims  [x]  Standard    []  Specialized-____ [x]  provide ability to propel manual   [x]  increase self-propulsion with hand wheelchair weakness/decreased grasp     []  Spoke protector/guard   []   prevent hands from getting caught in spokes   Tires:  [x]  pneumatic with airless inserts []  flat free inserts  []  solid  Style:  [x]  decrease roll resistance              [x]  prevent frequent flats  [x]  increase shock absorbency  [x]  decrease maintenance   []  decrease pain from road shock    []  decrease spasms from road shock    Wheel Locks:    [x]  push []  pull []  scissor  [x]  lock wheels for transfers  [x]  lock wheels from rolling   Brake/wheel lock extension:  [x]  R  [x]  L  [x]  allow user to operate wheel locks due to decreased reach or strength   Caster housing: [employer] manager size:    5 x 1 inch                  Style:           Poly                               []  suspension fork  [x]  maneuverability   [x]  stability of wheelchair   [x]  durability  [x]  maintenance  [x]  angle adjustment for posture  [x]  allow for feet to come under        wheelchair base  [x]  allows change in seat to floor  height   [x]  increase shock absorbency  [x]  decrease pain from road shock  [x]  decrease spasms from road    shock   []  Side guards  []  prevent clothing getting caught in wheel or becoming soiled   [] provide hip and pelvic stability  []  eliminates contact between body and wheels  []  limit hand contact with wheels   [x]  Anti-tippers      [x]  prevent wheelchair from tipping    backward  [x]  assist caregiver with curbs     POWER MOBILITY      []  Scooter/POV    []  can safely operate   []  can safely transfer   []  has adequate trunk stability   []  cannot functionally propel  manual wheelchair    []  Power mobility base    []  non-ambulatory   []  cannot functionally propel manual wheelchair   []  cannot functionally and safely      operate scooter/POV  []  can safely operate power       wheelchair  []  home is accessible  []  willing to use power wheelchair     Tilt  []  Powered tilt on powered chair  []  Powered tilt on manual chair  []  Manual tilt on manual chair Comments:  []  change  position for pressure      []  elief/cannot weight shift   []  change position against  gravitational force on head and      shoulders   []  decrease pain  []  blood pressure management   []  control autonomic dysreflexia  []  decrease respiratory distress  []  management of spasticity  []  management of low tone  []  facilitate postural control   []  rest periods   []  control edema  []  increase sitting tolerance   []  aid with transfers     Recline   []  Power recline on power chair  []  Manual recline on manual chair  Comments:    []  intermittent catheterization  []  manage spasticity  []  accommodate femur to back angle  []  change position for pressure relief/cannot weight shift rhigh risk of pressure sore development  []  tilt alone does not accomplish     effective pressure relief, maximum pressure relief achieved at -      _______ degrees tilt   _______ degrees recline   []  difficult to transfer to and from bed []  rest periods and sleeping in chair  []  repositioning for transfers  []  bring to full recline for ADL care  []  clothing/diaper changes in chair  []  gravity PEG tube feeding  []  head positioning  []  decrease pain  []  blood pressure management   []  control autonomic dysreflexia  []  decrease respiratory distress  []  user on ventilator     Elevator on mobility base  []  Power wheelchair  []  Scooter  []  increase Indep in transfers   []  increase Indep in ADLs    []  bathroom function and safety  []  kitchen/cooking function and safety  []  shopping  []  raise height for communication at standing level  []  raise height for eye contact which reduces cervical neck strain and pain  []  drive at raised height for safety and navigating crowds  []  Other:   []  Vertical position system  (anterior tilt)     (Drive locks-out)    []  Stand       (Drive enabled)  []  independent weight bearing  []  decrease joint contractures  []  decrease/manage spasticity  []  decrease/manage spasms   []  pressure distribution away from   scapula, sacrum, coccyx, and ischial tuberosity  []  increase digestion and elimination   []  access to counters and cabinets  []  increase reach  []  increase interaction with others at eye level, reduces neck strain  []  increase performance of       MRADL(s)      Power elevating legrest    []  Center mount (Single) 85-170 degrees       []  Standard (Pair) 100-170 degrees  []  position legs at 90 degrees, not available with std power ELR  []  center mount tucks into chair to decrease turning radius in home, not available with std power ELR  []  provide change in position for LE  []  elevate legs during recline    []  maintain placement of feet on      footplate  []  decrease edema  []  improve circulation  []  actuator needed to elevate legrest  []  actuator needed to articulate legrest preventing knees from flexing  []  Increase ground clearance over      curbs  []   STD (pair) independently                     elevate legrest   POWER WHEELCHAIR CONTROLS      Controls/input device  []  Expandable  []  Non-expandable  []  Proportional  []  Right Hand []  Left Hand  []  Non-proportional/switches/head-array  []   Electrical/proximity         []   Mechanical      Manufacturer:___________________   Type:________________________ []  provides access for controlling wheelchair  []  programming for accurate control  []  progressive disease/changing condition  []  required for alternative drive      controls       []  lacks motor control to operate  proportional drive control  []  unable to understand proportional controls  []  limited movement/strength  []  extraneous movement / tremors / ataxic / spastic       []  Upgraded electronics controller/harness    []  Single power (tilt or recline)   []  Expandable    []  Non-expandable plus   []  Multi-power (tilt, recline, power legrest, power seat lift, vertical positioning system, stand)  []  allows input device to communicate  with drive motors  []  harness provides necessary connections between the controller, input device, and seat functions     []  needed in order to operate power seat functions through joystick/ input device  []  required for alternative drive controls     []  Enhanced display  []  required to connect all alternative drive controls   []  required for upgraded joystick      (lite-throw, heavy duty, micro)  []  Allows user to see in which mode and drive the wheelchair is set; necessary for alternate controls       []  Upgraded tracking electronics  []  correct tracking when on uneven surfaces makes switch driving more efficient and less fatiguing  []  increase safety when driving  []  increase ability to traverse thresholds    []  Safety / reset / mode switches     Type:    []  Used to change modes and stop the wheelchair when driving     []  Mount for joystick / input device/switches  []  swing away for access or transfers   []  attaches joystick / input device / switches to wheelchair   []  provides for consistent access  []  midline for optimal placement    []  Attendant controlled joystick plus     mount  []  safety  []  long distance driving  []  operation of seat functions  []  compliance with transportation regulations    []  Battery  []  required to power (power assist / scooter/ power wc / other):   []  Power inverter (24V to 12V)  []  required for ventilator / respiratory equipment / other:     CHAIR OPTIONS MANUAL & POWER      Armrests   [x]  adjustable height []  removable  []  swing away []  fixed  [x]  flip back  []  reclining  []  full length pads []  desk []  tube arms []  gel pads  [x]  provide support with elbow at 90    [x]  remove/flip back/swing away for  transfers  [x]  provide support and positioning of upper body    [x]  allow to come closer to table top  []  remove for access to tables  []  provide support for w/c tray  []  change of height/angles for variable activities   []  Elbow support / Elbow  stop  []  keep elbow positioned on arm pad  []  keep arms from falling off arm pad  during tilt and/or recline   Upper Extremity Support  []  Arm trough  []   R  []   L  Style:  []  swivel mount []  fixed mount   []  posterior hand support  []   tray  []  full tray  []  joystick cut out  []   R  []   L  Style:  []  decrease gravitational pull on      shoulders  []  provide support to increase UE  function  []  provide hand support in natural    position  []  position flaccid UE  []  decrease subluxation    []  decrease edema       []  manage spasticity   []  provide midline positioning  []  provide work surface  []  placement for AAC/ Computer/ EADL       Hangers/ Legrests   []  ______ degree  []  Elevating []  articulating  []  swing away []  fixed []  lift off  []  heavy duty  []  adjustable knee angle  []  adjustable calf panel   []  longer extension tube              []  provide LE support  []  maintain placement of feet on      footplate   []  accommodate lower leg length  []  accommodate to hamstring       tightness  []  enable transfers  []  provide change in position for LE's  []  elevate legs during recline    []  decrease edema  []  durability      Foot support   [x]  footplate []  R []  L [x]  flip up           []  Depth adjustable   []  angle adjustable  []  foot board/one piece    [x]  provide foot support  []  accommodate to ankle ROM  [x]  allow foot to go under wheelchair base  [x]  enable transfers     [x]  Shoe holders  [x]  position foot    [x]  decrease / manage spasticity  [x]  control position of LE  [x]  stability    [x]  safety     []  Ankle strap/heel      loops  []  support foot on foot support  []  decrease extraneous movement  []  provide input to heel   []  protect foot     []  Amputee adapter []  R  []  L     Style:                  Size:  []  Provide support for stump/residual extremity    []  Transportation tie-down  []  to provide crash tested tie-down brackets    []  Crutch/cane holder    []  O2  holder    []  IV hanger   []  Ventilator tray/mount    []  stabilize accessory on wheelchair       Component  Justification     [x]  Seat cushion     Acta embrace anti thrust with incontinence liner []  accommodate impaired sensation  []  decubitus ulcers present or history  []  unable to shift weight  []  increase pressure distribution  []  prevent pelvic extension  [x]  custom required "off-the-shelf"    seat cushion will not accommodate deformity  [x]  stabilize/promote pelvis alignment  [x]  stabilize/promote femur alignment  [x]  accommodate obliquity  []  accommodate multiple deformity  [x]  incontinent/accidents  [x]  low maintenance     []  seat mounts                 []  fixed []  removable  []  attach seat platform/cushion to wheelchair frame    []  Seat wedge    []  provide increased aggressiveness of seat shape to decrease sliding  down in the seat  []  accommodate ROM        []  Cover replacement   []  protect back or seat cushion  []   incontinent/accidents    []  Solid seat / insert    []  support cushion to prevent      hammocking  []  allows attachment of cushion to mobility base    []  Lateral pelvic/thigh/hip     support (Guides)     []  decrease abduction  []  accommodate pelvis  []  position upper legs  []  accommodate spasticity  []  removable for transfers     []  Lateral pelvic/thigh      supports mounts  []  fixed   []  swing-away   []  removable  []  mounts lateral pelvic/thigh supports     []  mounts lateral pelvic/thigh supports swing-away or removable for transfers    []  Medial thigh support (Pommel)  [] decrease adduction  [] accommodate ROM  []  remove for transfers   []  alignment      []  Medial thigh   []  fixed      support mounts      []  swing-away   []  removable  []  mounts medial thigh supports   []  Mounts medial supports swing- away or removable for transfers       Component  Justification   [x]  Back   Jay 3 deep contour     [x]  provide posterior trunk support []  facilitate tone   [x]  provide lumbar/sacral support []  accommodate deformity  [x]  support trunk in midline   [x]  custom required "off-the-shelf" back support will not accommodate deformity   [x]  provide lateral trunk support []  accommodate or decrease tone            []  Back mounts  []  fixed  []  removable  []  attach back rest/cushion to wheelchair frame   []  Lateral trunk      supports  []  R []  L  []  decrease lateral trunk leaning  []  accommodate asymmetry    []  contour for increased contact  []  safety    []  control of tone    []  Lateral trunk      supports mounts  []  fixed  []  swing-away   []  removable  []  mounts lateral trunk supports     []  Mounts lateral trunk supports swing-away or removable for transfers   [x]  Anterior chest      strap, vest     [x]  decrease forward movement of shoulder  [x]  decrease forward movement of trunk  [x]  safety/stability  []  added abdominal support  [x]  trunk alignment  []  assistance with shoulder control   []  decrease shoulder elevation    []  Headrest      []  provide posterior head support  []  provide posterior neck support  []  provide lateral head support  []  provide anterior head support  []  support during tilt and recline  []  improve feeding     []  improve respiration  []  placement of switches  []  safety    []  accommodate ROM   []  accommodate tone  []  improve visual orientation   []  Headrest           []  fixed []  removable []  flip down      Mounting hardware   []  swing-away laterals/switches  []  mount headrest   []  mounts headrest flip down or  removable for transfers  []  mount headrest swing-away laterals   []  mount switches     []  Neck Support    []  decrease neck rotation  []  decrease forward neck flexion   Pelvic Positioner    []  std hip belt          [x]  padded  hip belt - evo fix  []  dual pull hip belt  []  four point hip belt  [x]  stabilize tone  [x]  decrease falling out of chair  []  prevent excessive extension  []  special pull angle to  control      rotation  [x]  pad for protection over boney   prominence  [x]  promote comfort    []  Essential needs        bag/pouch   []  medicines []  special food rorthotics []  clothing changes  []  diapers  []  catheter/hygiene []  ostomy supplies   The above equipment has a life- long use expectancy.  Growth and changes in medical and/or functional conditions would be the exceptions.   SUMMARY:    ASSESSMENT:  Gait speed: 0.33 m/s with posterior walker, high fall risk (<0.50 m/s); pt uses WB on bil UE excessively with bil elbows bent to 90 deg during walking for support, decreased bil foot clearance bil, decreased bil hip/knee flexion due to excessive tone, pt circumduct's legs to swing legs forward- not functional Ambulator due to gait quality  CLINICAL IMPRESSION: Patient is a 32 y.o. female who was seen today for physical therapy evaluation and treatment for wheelchair evaluation. Patient is currently using a K5 chair which is not working appropriately with broken brakes, worn casters, and worn tires. Patient has also outgrown chair where cushion and size of the chair is uncomfortable for prolonged use. Patient is currently able to use posterior walker (older walker) but her walking is not very functional as she is using primarily UE for WB and support and she has moderate to severe tone in bil LE due to her medical diagnosis. Patient will benefit from newer K5 manual wheelchair to improve her functional independence, safety with transfers, and help conserve energy and preserve UE functional use.    OBJECTIVE IMPAIRMENTS Abnormal gait, decreased activity tolerance, decreased balance, decreased coordination, decreased endurance, decreased mobility, difficulty walking, decreased strength, decreased safety awareness, increased muscle spasms, impaired flexibility, impaired tone, impaired UE functional use, improper body mechanics, and postural dysfunction.   ACTIVITY LIMITATIONS carrying, lifting,  bending, standing, squatting, stairs, transfers, bathing, toileting, dressing, and hygiene/grooming  PARTICIPATION LIMITATIONS: meal prep, cleaning, laundry, medication management, shopping, and community activity  PERSONAL FACTORS Age, Past/current experiences, Time since onset of injury/illness/exacerbation, Transportation, and 1-2 comorbidities: CP  are also affecting patient's functional outcome.   REHAB POTENTIAL: Fair Medical diagnosis  CLINICAL DECISION MAKING: Stable/uncomplicated  EVALUATION COMPLEXITY: Low                                   GOALS: One time visit. No goals established.    PLAN: PT FREQUENCY: one time visit    Ileana Ladd, PT 08/03/2023, 9:38 AM    I concur with the above findings and recommendations of the therapist:  Physician name printed:         Physician's signature:      Date:

## 2023-08-29 ENCOUNTER — Encounter: Payer: Self-pay | Admitting: Obstetrics and Gynecology

## 2023-09-03 ENCOUNTER — Other Ambulatory Visit: Payer: Self-pay | Admitting: *Deleted

## 2023-09-03 DIAGNOSIS — K649 Unspecified hemorrhoids: Secondary | ICD-10-CM

## 2023-09-03 NOTE — Progress Notes (Signed)
Mychart message received from pt's mother [name]) requesting referral for her daughter to [facility]. Kerri Harris had surgery for hemorrhoid in 2019 and now the hemorrhoid(s) have returned and are very painful. Per consult w/Dr. Alysia Penna, referral order was placed and [facility] office was notified.

## 2023-09-20 ENCOUNTER — Ambulatory Visit: Payer: Managed Care, Other (non HMO) | Admitting: Internal Medicine

## 2023-09-20 ENCOUNTER — Encounter: Payer: Self-pay | Admitting: Internal Medicine

## 2023-09-20 VITALS — BP 120/74 | HR 87 | Temp 98.7°F | Ht 59.0 in

## 2023-09-20 DIAGNOSIS — Z23 Encounter for immunization: Secondary | ICD-10-CM | POA: Diagnosis not present

## 2023-09-20 DIAGNOSIS — Z0000 Encounter for general adult medical examination without abnormal findings: Secondary | ICD-10-CM | POA: Diagnosis not present

## 2023-09-20 DIAGNOSIS — Z114 Encounter for screening for human immunodeficiency virus [HIV]: Secondary | ICD-10-CM

## 2023-09-20 DIAGNOSIS — Z0001 Encounter for general adult medical examination with abnormal findings: Secondary | ICD-10-CM

## 2023-09-20 DIAGNOSIS — Z1159 Encounter for screening for other viral diseases: Secondary | ICD-10-CM

## 2023-09-20 NOTE — Progress Notes (Signed)
Subjective:  Patient ID: Kerri Harris, female    DOB: 12-20-1990  Age: 32 y.o. MRN: [id number]  CC: Annual Exam   HPI Kerri Harris presents for a CPX ----  She is with her parents today and they tell me she is doing well.     Outpatient Medications Prior to Visit  Medication Sig Dispense Refill   baclofen (LIORESAL) 10 MG tablet Take 1 tablet (10 mg total) by mouth 3 (three) times daily as needed for muscle spasms. 90 each 2   Emollient (COCOA BUTTER EX) Apply topically. Prn to eczema     psyllium (METAMUCIL) 58.6 % powder Take 1 packet by mouth every evening.     No facility-administered medications prior to visit.    ROS Review of Systems  Constitutional: Negative.   HENT: Negative.    Respiratory:  Negative for cough, chest tightness, shortness of breath and wheezing.   Cardiovascular:  Negative for chest pain, palpitations and leg swelling.  Gastrointestinal:  Negative for abdominal pain, diarrhea and vomiting.  Genitourinary: Negative.  Negative for difficulty urinating.  Musculoskeletal:  Positive for gait problem.  Skin: Negative.   Hematological:  Negative for adenopathy. Does not bruise/bleed easily.  Psychiatric/Behavioral: Negative.      Objective:  BP 120/74 (BP Location: Right Arm, Patient Position: Sitting, Cuff Size: Large)   Pulse 87   Temp 98.7 F (37.1 C) (Oral)   Ht 4\' 11"  (1.499 m)   LMP 02/15/2021   SpO2 98%   BMI 24.08 kg/m   BP Readings from Last 3 Encounters:  09/20/23 120/74  05/23/23 127/63  03/22/23 (!) 162/89    Wt Readings from Last 3 Encounters:  05/23/23 119 lb 3.2 oz (54.1 kg)  03/22/23 110 lb (49.9 kg)  02/28/23 102 lb (46.3 kg)    Physical Exam Vitals reviewed.  HENT:     Mouth/Throat:     Mouth: Mucous membranes are moist.  Eyes:     General: No scleral icterus.    Conjunctiva/sclera: Conjunctivae normal.  Cardiovascular:     Rate and Rhythm: Normal rate and regular rhythm.     Heart sounds: No murmur  heard. Pulmonary:     Effort: Pulmonary effort is normal.     Breath sounds: No stridor. No wheezing, rhonchi or rales.  Abdominal:     General: Abdomen is flat.     Palpations: There is no mass.     Tenderness: There is no abdominal tenderness. There is no guarding.     Hernia: No hernia is present.  Musculoskeletal:     Cervical back: Neck supple.     Right lower leg: No edema.     Left lower leg: No edema.  Lymphadenopathy:     Cervical: No cervical adenopathy.  Skin:    General: Skin is warm.  Neurological:     Mental Status: She is alert. Mental status is at baseline.     Lab Results  Component Value Date   WBC 6.6 03/04/2021   HGB 13.1 03/04/2021   HCT 40.8 03/04/2021   PLT 329 03/04/2021   GLUCOSE 95 09/02/2019   CHOL 139 08/07/2017   TRIG 148.0 08/07/2017   HDL 49.00 08/07/2017   LDLCALC 61 08/07/2017   ALT 10 08/22/2019   AST 13 08/22/2019   NA 139 09/02/2019   K 3.6 09/02/2019   CL 105 09/02/2019   CREATININE 0.74 09/02/2019   BUN 15 09/02/2019   CO2 23 09/02/2019   TSH 1.69 06/11/2015  HGBA1C 5.3 11/14/2017    No results found.  Assessment & Plan:  Flu vaccine need -     Flu vaccine trivalent PF, 6mos and older(Flulaval,Afluria,Fluarix,Fluzone)  Encounter for screening for HIV -     HIV Antibody (routine testing w rflx); Future  Need for hepatitis C screening test -     Hepatitis C antibody; Future  Encounter for general adult medical examination with abnormal findings- Exam completed, labs reviewed, vaccines reviewed and updated, cancer screenings are UTD, pt ed material was given.  -     Hepatitis C antibody; Future -     HIV Antibody (routine testing w rflx); Future     Follow-up: Return in about 6 months (around 03/20/2024).  Kerri Linger, MD

## 2023-09-20 NOTE — Patient Instructions (Signed)

## 2023-09-21 LAB — HEPATITIS C ANTIBODY: Hepatitis C Ab: NONREACTIVE

## 2023-09-21 LAB — HIV ANTIBODY (ROUTINE TESTING W REFLEX): HIV 1&2 Ab, 4th Generation: NONREACTIVE

## 2023-09-24 ENCOUNTER — Encounter: Payer: Managed Care, Other (non HMO) | Admitting: Physical Medicine and Rehabilitation

## 2023-10-01 ENCOUNTER — Encounter
Payer: Managed Care, Other (non HMO) | Attending: Physical Medicine and Rehabilitation | Admitting: Physical Medicine and Rehabilitation

## 2023-10-01 ENCOUNTER — Encounter: Payer: Self-pay | Admitting: Physical Medicine and Rehabilitation

## 2023-10-01 VITALS — BP 126/75 | HR 88 | Ht 59.0 in

## 2023-10-01 DIAGNOSIS — R252 Cramp and spasm: Secondary | ICD-10-CM | POA: Insufficient documentation

## 2023-10-01 DIAGNOSIS — Z789 Other specified health status: Secondary | ICD-10-CM | POA: Diagnosis present

## 2023-10-01 DIAGNOSIS — G801 Spastic diplegic cerebral palsy: Secondary | ICD-10-CM | POA: Diagnosis present

## 2023-10-01 DIAGNOSIS — Z7409 Other reduced mobility: Secondary | ICD-10-CM | POA: Insufficient documentation

## 2023-10-01 MED ORDER — SODIUM CHLORIDE (PF) 0.9 % IJ SOLN
6.0000 mL | Freq: Once | INTRAMUSCULAR | Status: AC
Start: 2023-10-01 — End: 2023-10-01
  Administered 2023-10-01: 6 mL via INTRAVENOUS

## 2023-10-01 MED ORDER — ONABOTULINUMTOXINA 100 UNITS IJ SOLR
600.0000 [IU] | Freq: Once | INTRAMUSCULAR | Status: AC
Start: 2023-10-01 — End: 2023-10-01
  Administered 2023-10-01: 600 [IU] via INTRAMUSCULAR

## 2023-10-01 NOTE — Addendum Note (Signed)
Addended by: Becky Sax on: 10/01/2023 09:49 AM   Modules accepted: Orders

## 2023-10-01 NOTE — Progress Notes (Signed)
Kerri Harris is a 32 y.o. year old female  who  has a past medical history of Cerebral palsy (HCC), Eczema, Seizures (HCC), and Uses walker.   They are presenting to PM&R clinic for follow up related to spasticity requiring botox injections Q3Months .  No acute concerns. No changes from last visit. Still in agreement with adjustments from last injection as below. Has RLE AFO which is fitting well. She is overall improving in her mobility with PT and doing great!   Botolulinum Toxin Injection: [x]  BOTOX (onabotulinumtoxinA) [_] DYSPORT (abobotulinumtoxinA) [_] Xeomin (Incobotulinum toxin A)  Goals with treatment: [x ] Decrease spasms/ abnormal movements [ x] Improve Active / Passive ROM [ ]  Improve ADLs [x ] Improve functional mobility [ x] Improve gait mechanics [ x] Improve positioning/posture [x ] Prevent contracture  [ x] Prevent joint destruction [ ]  Prevent skin breakdown [ x] Decrease caregiver burden [x ] Improve hygiene [ ]  Improve Pain  MEDICATION:  ONAbotulinum toxin. 600 Units   NaCl 6 ccs   CONSENT: Obtained in writing followed by time-out per policy. Consent uploaded to chart.  Benefits discussed included, but were not limited to, decreased muscle tightness and spasticity, increased joint range of motion, improved limb positioning and facilitation of hygiene and nursing care.   Risks discussed included, but were not limited to, pain and discomfort, bleeding, bruising, excessive weakness, venous thrombosis, muscle atrophy, and distant spread of toxin which could include generalized muscle weakness, diplopia, blurred vision, ptosis, dysphagia, dysphonia, dysarthria, urinary incontinence and breathing difficulties. These symptoms have been reported hours to weeks after injection. Swallowing and breathing difficulties may be life threatening, and there have been reports of death. Patient/Family member/Guardian/Caregiver have been offered botulinum toxin informational  material upon initial consultation and this information has been continually available. All questions answered to patient/family member/guardian/ caregiver satisfaction. They would like to proceed with procedure. There are no noted contraindications to procedure.  PROCEDURE [x]  Without Ultrasound: Patient was placed in a position with the appropriate muscles exposed, located and identified. The skin was cleaned with ChloraPrep and ethyl chloride was sprayed for topical anesthetic. Using combination EMG amplification and electrical stimulation, the following muscles were identified using anatomical landmarks described by Perotto, et al (1994) and injected following aspiration to ensure blood vessels were avoided.  [_ ] With Ultrasound: Patient was placed in a position with the appropriate muscles exposed, located and identified. The skin was cleaned with ChloraPrep and ethyl chloride was sprayed for topical anesthetic. Using combination Ultrasound guidance for anatomical guidance, avoidance of significant vasculature, to decrease the risk of hematoma formation and ensure botox was placed in the correct location; EMG amplification and electrical stimulation, the following muscles were identified and injected following aspiration to ensure blood vessels were avoided. A _linear transducer was used during the procedure. The botulinum toxin was visualized entering the appropriate musculature.   MUSCLE: Units /Sites Add long R 50 ->30 ,L 50 ->30 Add mag R 50 ->30 ,L 50 ->30 Vast int R 50-> 70U x2 spots ,L 50-> 70Ux2 spots Vast med R 50   ,L 50 Rect Fem R 50-> 70U x2 spots ,L 50 -> 70U x2 spots Gastroc R 50, L 50   600 units were injected without difficulty. No complications were encountered. The patient tolerated the procedure well. Wasted 0   PLAN: - Resume Usual Activities. Notify Physician of any unusual bleeding, erythema or concern for side effects as reviewed above. - Apply ice prn for pain -  Tylenol  prn for pain - Follow up in 3 months to assess response and repeat injections

## 2024-04-23 ENCOUNTER — Other Ambulatory Visit: Payer: Self-pay | Admitting: Physical Medicine and Rehabilitation

## 2024-04-24 NOTE — Telephone Encounter (Signed)
 Thank you :)

## 2024-04-24 NOTE — Telephone Encounter (Signed)
 I will refill now, but it has been 6 months since I have seen her; any further refills will require a visit.

## 2024-06-02 ENCOUNTER — Encounter: Payer: Self-pay | Admitting: Physical Medicine and Rehabilitation

## 2024-06-02 ENCOUNTER — Encounter: Attending: Physical Medicine and Rehabilitation | Admitting: Physical Medicine and Rehabilitation

## 2024-06-02 VITALS — BP 120/67 | HR 97 | Ht 59.0 in | Wt 118.0 lb

## 2024-06-02 DIAGNOSIS — Z7409 Other reduced mobility: Secondary | ICD-10-CM | POA: Insufficient documentation

## 2024-06-02 DIAGNOSIS — Z789 Other specified health status: Secondary | ICD-10-CM | POA: Diagnosis present

## 2024-06-02 DIAGNOSIS — G801 Spastic diplegic cerebral palsy: Secondary | ICD-10-CM | POA: Diagnosis present

## 2024-06-02 MED ORDER — BACLOFEN 15 MG PO TABS: 15.0000 mg | ORAL_TABLET | Freq: Three times a day (TID) | ORAL | 11 refills | Status: DC

## 2024-06-02 NOTE — Progress Notes (Signed)
 Subjective:    Patient ID: Kerri Harris, female    DOB: September 03, 1991, 33 y.o.   MRN: [id number]  HPI  Kerri Harris is a 33 y.o. year old female  who  has a past medical history of Cerebral palsy (HCC), Eczema, Seizures (HCC), and Uses walker.   They are presenting to PM&R clinic for follow up related to spastic diplegic CP .  Plan from last visit: No acute concerns. No changes from last visit. Still in agreement with adjustments from last injection as below. Has RLE AFO which is fitting well. She is overall improving in her mobility with PT and doing great!      Spasticity MUSCLE: Units /Sites Add long R 50 ->30 ,L 50 ->30 Add mag R 50 ->30 ,L 50 ->30 Vast int R 50-> 70U x2 spots ,L 50-> 70Ux2 spots Vast med R 50   ,L 50 Rect Fem R 50-> 70U x2 spots ,L 50 -> 70U x2 spots Gastroc R 50, L 50     600 units were injected without difficulty   Impaired mobility and ADLs Assessment & Plan: Will give a call to [employer] to look into repairing your wheelchair, visit positioning straps for your feet specifically.     Interval Hx:  - Therapies: She has a pool in the back yard and swims every day. She is walking with her walker every day, and uses her wheelchair for distance. Her dad will help stretch and bend her legs in the pool.    - Follow ups: Did not have an issue with injections but didn't get another appointment. Mom felt she was a little looser but not much with the injections. No other modalities to loosen her legs. With botox  her walking I'm[proved about 35% and lasted about 2 months.    - Falls: none   - DME:She mainly uses her walker to get around. WC is fine. Her foot brace is garbage, she cannot use it because she cannot bend at the knee.    - Medications: She notices reductions in spasms with the baclofen , but mom does not notice a difference. They are not painful.    - Other concerns: Has some constipation at baseline but well controlled with fiber supplements.    Pain Inventory Average Pain 0 Pain Right Now 0 My pain is N/A  In the last 24 hours, has pain interfered with the following? General activity 0 Relation with others 0 Enjoyment of life 0 What TIME of day is your pain at its worst? No pain Sleep (in general) Good  Pain is worse with: unsure Pain improves with: No Relief from Meds: No pain meds  Family History  Problem Relation Age of Onset   Arthritis Mother    Hypertension Maternal Grandmother    Diabetes Maternal Grandmother    Social History   Socioeconomic History   Marital status: Single    Spouse name: Not on file   Number of children: 0   Years of education: 12   Highest education level: 12th grade  Occupational History   Not on file  Tobacco Use   Smoking status: Never   Smokeless tobacco: Never  Vaping Use   Vaping status: Never Used  Substance and Sexual Activity   Alcohol use: No    Alcohol/week: 0.0 standard drinks of alcohol   Drug use: No   Sexual activity: Never  Other Topics Concern   Not on file  Social History Narrative   Fun: Play and exercise, read  Denies religious beliefs effecting health care.    Social Drivers of Corporate investment banker Strain: Low Risk  (09/19/2023)   Overall Financial Resource Strain (CARDIA)    Difficulty of Paying Living Expenses: Not hard at all  Food Insecurity: No Food Insecurity (09/19/2023)   Hunger Vital Sign    Worried About Running Out of Food in the Last Year: Never true    Ran Out of Food in the Last Year: Never true  Transportation Needs: No Transportation Needs (09/19/2023)   PRAPARE - Administrator, Civil Service (Medical): No    Lack of Transportation (Non-Medical): No  Physical Activity: Unknown (09/19/2023)   Exercise Vital Sign    Days of Exercise per Week: 7 days    Minutes of Exercise per Session: Patient declined  Stress: No Stress Concern Present (09/19/2023)   [name] - Occupational  Stress Questionnaire    Feeling of Stress : Not at all  Social Connections: Socially Isolated (09/19/2023)   Social Connection and Isolation Panel    Frequency of Communication with Friends and Family: More than three times a week    Frequency of Social Gatherings with Friends and Family: More than three times a week    Attends Religious Services: Never    Database administrator or Organizations: No    Attends Engineer, structural: Not on file    Marital Status: Never married   Past Surgical History:  Procedure Laterality Date   feet surgery Bilateral    HEMORRHOID SURGERY N/A 01/22/2018   Procedure: HEMORRHOIDECTOMY SINGLE COLUMN;  Surgeon: Debby Hila, MD;  Location: [facility];  Service: General;  Laterality: N/A;   HIP SURGERY Bilateral yrs ago   KNEE SURGERY Bilateral    REMOVAL OF DRUG DELIVERY IMPLANT Left 03/08/2021   Procedure: REMOVAL OF DRUG DELIVERY IMPLANT (NEXPLANON ) FROM  LEFT UPPER ARM;  Surgeon: Corene Coy, MD;  Location: [facility];  Service: Gynecology;  Laterality: Left;   right wrist surgery     cannot bend at wrist   ROBOTIC ASSISTED TOTAL HYSTERECTOMY Bilateral 03/08/2021   Procedure: XI ROBOTIC ASSISTED TOTAL HYSTERECTOMY WITH SALPINGECTOMY, CYSTOSCOPY;  Surgeon: Corene Coy, MD;  Location: [facility];  Service: Gynecology;  Laterality: Bilateral;   Past Surgical History:  Procedure Laterality Date   feet surgery Bilateral    HEMORRHOID SURGERY N/A 01/22/2018   Procedure: HEMORRHOIDECTOMY SINGLE COLUMN;  Surgeon: Debby Hila, MD;  Location: [facility];  Service: General;  Laterality: N/A;   HIP SURGERY Bilateral yrs ago   KNEE SURGERY Bilateral    REMOVAL OF DRUG DELIVERY IMPLANT Left 03/08/2021   Procedure: REMOVAL OF DRUG DELIVERY IMPLANT (NEXPLANON ) FROM  LEFT UPPER ARM;  Surgeon: Corene Coy, MD;  Location: [facility];  Service: Gynecology;  Laterality: Left;   right wrist  surgery     cannot bend at wrist   ROBOTIC ASSISTED TOTAL HYSTERECTOMY Bilateral 03/08/2021   Procedure: XI ROBOTIC ASSISTED TOTAL HYSTERECTOMY WITH SALPINGECTOMY, CYSTOSCOPY;  Surgeon: Corene Coy, MD;  Location: [facility];  Service: Gynecology;  Laterality: Bilateral;   Past Medical History:  Diagnosis Date   Cerebral palsy (HCC)    legs stiff do not bend   Eczema    comes and goes per mother  on neck at times uses cocoa butter prn right neck drying out now per mother   Seizures (HCC)    Childhood seizures - not since age 47  Uses walker    in home for ambulation, uses wheelchair for distance   BP 120/67 (Cuff Size: Normal)   Pulse 97   Ht 4' 11 (1.499 m)   Wt 118 lb (53.5 kg) Comment: wheelchair  LMP 02/15/2021   SpO2 98%   BMI 23.83 kg/m   Opioid Risk Score:   Fall Risk Score:  `1  Depression screen [facility] 2/9     06/02/2024   10:46 AM 10/01/2023    9:03 AM 05/23/2023   11:06 AM 02/28/2023    1:02 PM 07/03/2022    1:43 PM 11/17/2020    5:12 PM 09/03/2019    8:18 AM  Depression screen PHQ 2/9  Decreased Interest 0 0 0 1 0 0 0  Down, Depressed, Hopeless 0 0 0 0 0 0 0  PHQ - 2 Score 0 0 0 1 0 0 0  Altered sleeping    2  0   Tired, decreased energy    0  0   Change in appetite    0  0   Feeling bad or failure about yourself     0  0   Trouble concentrating    0  0   Moving slowly or fidgety/restless    0  0   Suicidal thoughts    0  0   PHQ-9 Score    3  0      Review of Systems  Neurological:        Spastic diplegic cerebral palsy   All other systems reviewed and are negative.      Objective:   Physical Exam  PE: Constitution: Appropriate appearance for age. No apparent distress  Resp: No respiratory distress. No accessory muscle usage. on RA and CTAB Cardio: Well perfused appearance. No peripheral edema. Abdomen: Nondistended. Nontender.   Psych: Appropriate mood and affect. Neuro: AAOx4. Mild cognitive deficits, mostly in  delayed responses; appropriate content.    Neurologic Exam:   + Bilateral rocker bottom feet Sensory exam: revealed  with reduced sensation to light touch in bilateral lower extremities Motor exam: strength 5/5 throughout bilateral upper extremities and with exception of 2+/5 strength RLE hip flexion, knee extension, plantarflexion; 2-/5 LLE hip flexion, knee extension, plantarflexion; 1/5 active dorsiflexion b/l --unchanged Coordination: Fine motor coordination in BL UE WNL Gait: Wheelchair with belt clip foot positioners;no apparent breakdown. Cushion intact.  Tone: MAS 3 BL knee extensors, MAS 2-3 R adductor, MAS 3-4 L adductor; MAS 1+ bilateral plantarflexors (gastroc > soleus), Mas 3 b/l hip flexors        Assessment & Plan:   Krissi Willaims is a 33 y.o. year old female  who  has a past medical history of Cerebral palsy (HCC), Eczema, Seizures (HCC), and Uses walker.    They are presenting to PM&R clinic for follow up related to spastic diplegic CP.  Spastic diplegic cerebral palsy (HCC) I am increasing oral baclofen  to 15 mg 3 times daily.  Given she has been on this medication for over a year, I would like to get some lab work for monitoring, which can be done anytime before next appointment.  * addendum- Labs 06/02/24 - renal and liver function  WNL   Follow-up in the next few weeks for repeat Botox  injections into the quads and adductors; I will also be adding injections into the hips.  We will increase the dosage from 600 units to 1000 units total.  If she does not get better results out of this  next set of injections, we may switch to Xeomin.  Please record a video of her walking to show me prior to injections next visit to help with planning.  Tentative planning: 900 U total  Add Iliopsoas - 50 U R, 50 U L Adductor long R 50  ,L 50  Adductor mag R 50  ,L 50  Vastus intermedius R  100 ,L 100 Vastus medialis R 50   ,L 50 Rectus Femoris R  100 x2 spots ,L 100 x2  spots Gastrocnemius R 50, L 50   Impaired mobility and ADLs    Continue being active at home in the pool and using your walker on a daily basis to stand, bear weight, and walk.  Stretch your hips and knees daily.  I agree that the ankle brace is likely not benefiting her much, and she does not need to continue this.

## 2024-06-02 NOTE — Patient Instructions (Signed)
 Continue being active at home in the pool and using your walker on a daily basis to stand, bear weight, and walk.  Stretch your hips and knees daily.  I agree that the ankle brace is likely not benefiting her much, and she does not need to continue this.  I am increasing oral baclofen  to 15 mg 3 times daily.  Given she has been on this medication for over a year, I would like to get some lab work for monitoring, which can be done anytime before next appointment.  Follow-up in the next few weeks for repeat Botox  injections into the quads and adductors; I will also be adding injections into the hips.  We will increase the dosage from 600 units to 1000 units total.  If she does not get better results out of this next set of injections, we may switch to Xeomin.  Please record a video of her walking to show me prior to injections next visit to help with planning.

## 2024-06-03 LAB — COMPREHENSIVE METABOLIC PANEL WITH GFR
ALT: 12 IU/L (ref 0–32)
AST: 15 IU/L (ref 0–40)
Albumin: 4.6 g/dL (ref 3.9–4.9)
Alkaline Phosphatase: 45 IU/L (ref 44–121)
BUN/Creatinine Ratio: 19 (ref 9–23)
BUN: 11 mg/dL (ref 6–20)
Bilirubin Total: 0.7 mg/dL (ref 0.0–1.2)
CO2: 18 mmol/L — ABNORMAL LOW (ref 20–29)
Calcium: 9.4 mg/dL (ref 8.7–10.2)
Chloride: 106 mmol/L (ref 96–106)
Creatinine, Ser: 0.58 mg/dL (ref 0.57–1.00)
Globulin, Total: 2.1 g/dL (ref 1.5–4.5)
Glucose: 89 mg/dL (ref 70–99)
Potassium: 3.7 mmol/L (ref 3.5–5.2)
Sodium: 142 mmol/L (ref 134–144)
Total Protein: 6.7 g/dL (ref 6.0–8.5)
eGFR: 122 mL/min/{1.73_m2} (ref >59)

## 2024-07-02 ENCOUNTER — Encounter: Attending: Physical Medicine and Rehabilitation | Admitting: Physical Medicine and Rehabilitation

## 2024-07-02 ENCOUNTER — Encounter: Payer: Self-pay | Admitting: Physical Medicine and Rehabilitation

## 2024-07-02 VITALS — BP 128/81 | HR 110 | Ht 59.0 in

## 2024-07-02 DIAGNOSIS — R252 Cramp and spasm: Secondary | ICD-10-CM | POA: Diagnosis present

## 2024-07-02 DIAGNOSIS — G801 Spastic diplegic cerebral palsy: Secondary | ICD-10-CM | POA: Insufficient documentation

## 2024-07-02 MED ORDER — ONABOTULINUMTOXINA 100 UNITS IJ SOLR
800.0000 [IU] | Freq: Once | INTRAMUSCULAR | Status: AC
  Administered 2024-07-02: 800 [IU] via INTRAMUSCULAR

## 2024-07-02 MED ORDER — SODIUM CHLORIDE (PF) 0.9 % IJ SOLN
8.0000 mL | Freq: Once | INTRAMUSCULAR | Status: AC
  Administered 2024-07-02: 8 mL

## 2024-07-02 NOTE — Progress Notes (Signed)
 Ambulation: stiff, straight leg with forward flexion; dragging circumduction of right foot.  Botolulinum Toxin Injection: [x ] BOTOX  (onabotulinumtoxinA ) [_] DYSPORT (abobotulinumtoxinA) [_] Xeomin (Incobotulinum toxin A)  Goals with treatment: [ x] Decrease spasms/ abnormal movements [x ] Improve Active / Passive ROM [ x] Improve ADLs [x ] Improve functional mobility [ x] Improve gait mechanics [x ] Improve positioning/posture [x ] Prevent contracture  [ ]  Prevent joint destruction [ ]  Prevent skin breakdown [x ] Decrease caregiver burden [ ]  Improve hygiene [ ]  Improve Pain  MEDICATION:  ONAbotulinum toxin. 800 Units    NaCl 8 ml   CONSENT: Obtained in writing followed by time-out per policy. Consent uploaded to chart.  Benefits discussed included, but were not limited to, decreased muscle tightness and spasticity, increased joint range of motion, improved limb positioning and facilitation of hygiene and nursing care.   Risks discussed included, but were not limited to, pain and discomfort, bleeding, bruising, excessive weakness, venous thrombosis, muscle atrophy, and distant spread of toxin which could include generalized muscle weakness, diplopia, blurred vision, ptosis, dysphagia, dysphonia, dysarthria, urinary incontinence and breathing difficulties. These symptoms have been reported hours to weeks after injection. Swallowing and breathing difficulties may be life threatening, and there have been reports of death. Patient/Family member/Guardian/Caregiver have been offered botulinum toxin informational material upon initial consultation and this information has been continually available. All questions answered to patient/family member/guardian/ caregiver satisfaction. They would like to proceed with procedure. There are no noted contraindications to procedure.  PROCEDURE [x]  Without Ultrasound: Patient was placed in a position with the appropriate muscles exposed, located and  identified. The skin was cleaned with ChloraPrep and ethyl chloride was sprayed for topical anesthetic. Using combination EMG amplification and electrical stimulation, the following muscles were identified using anatomical landmarks described by Perotto, et al (1994) and injected following aspiration to ensure blood vessels were avoided.  [_ ] With Ultrasound: Patient was placed in a position with the appropriate muscles exposed, located and identified. The skin was cleaned with ChloraPrep and ethyl chloride was sprayed for topical anesthetic. Using combination Ultrasound guidance for anatomical guidance, avoidance of significant vasculature, to decrease the risk of hematoma formation and ensure botox  was placed in the correct location; EMG amplification and electrical stimulation, the following muscles were identified and injected following aspiration to ensure blood vessels were avoided. A _linear transducer was used during the procedure. The botulinum toxin was visualized entering the appropriate musculature.   MUSCLE: Units /Sites: 800 U total  Add Iliopsoas - 50 U R, 25 U L Adductor long R 50  ,L 50  Adductor mag R 50  ,L 50  Vastus intermedius R  50/50 -> 100 R ,L 50 L Vastus medialis R 50   ,L 50 Rectus Femoris R  50/50 -> 100 x2 spots ,L 75 x2 spots Gastrocnemius R 50, L 50   800 Units were injected without difficulty. No complications were encountered. The patient tolerated the procedure well. Wasted 0 U   PLAN: - Resume Usual Activities. Notify Physician of any unusual bleeding, erythema or concern for side effects as reviewed above. - Apply ice prn for pain - Tylenol  prn for pain - Follow up in 6-8 weeks to assess response to injection

## 2024-07-02 NOTE — Patient Instructions (Signed)
-   Resume Usual Activities. Notify Physician of any unusual bleeding, erythema or concern for side effects as reviewed above. - Apply ice prn for pain - Tylenol prn for pain - Follow up in 6-8 weeks to assess response to injection

## 2024-09-01 ENCOUNTER — Encounter: Attending: Physical Medicine and Rehabilitation | Admitting: Physical Medicine and Rehabilitation

## 2024-09-01 VITALS — BP 124/77 | HR 99 | Ht 59.0 in | Wt 119.0 lb

## 2024-09-01 DIAGNOSIS — G801 Spastic diplegic cerebral palsy: Secondary | ICD-10-CM | POA: Insufficient documentation

## 2024-09-01 DIAGNOSIS — R252 Cramp and spasm: Secondary | ICD-10-CM | POA: Diagnosis present

## 2024-09-01 DIAGNOSIS — Z7409 Other reduced mobility: Secondary | ICD-10-CM | POA: Insufficient documentation

## 2024-09-01 DIAGNOSIS — Z789 Other specified health status: Secondary | ICD-10-CM | POA: Diagnosis present

## 2024-09-01 MED ORDER — TIZANIDINE HCL 4 MG PO TABS: 2.0000 mg | ORAL_TABLET | Freq: Every evening | ORAL | 5 refills | Status: DC | PRN

## 2024-09-01 NOTE — Patient Instructions (Signed)
 Stop baclofen  due to gastrointestinal side effects.  Start tizanidine at nighttime for spasms; start with one half tab, then progressed up to 1 tab if it does not make her too sedated.  Come back at the end of October for repeat Botox  for reducing spasms, positioning, and preventing skin breakdown and reducing caregiver burden.  I am sending you back to PT with the neurorehab to work on gait with your walker, especially with turns as grandma describes patient forgetting to place down her left foot during these.  Also, they should be able to troubleshoot problems with the walker and talk to [employer] for any adjustments with the handles.  We did briefly discuss neurolysis to the bilateral lower extremities versus baclofen  pump, but given that patient remains ambulatory most of the time we want to maintain some of her tone.  Therefore, we will continue current management.

## 2024-09-01 NOTE — Progress Notes (Signed)
 Subjective:    Patient ID: Kerri Harris, female    DOB: 06-03-1991, 33 y.o.   MRN: [id number]  HPI  Kerri Harris is a 33 y.o. year old female  who  has a past medical history of Cerebral palsy (HCC), Eczema, Seizures (HCC), and Uses walker.    They are presenting to PM&R clinic for follow up related to spastic diplegic CP .   Plan from last visit: Add Iliopsoas - 50 U R, 25 U L Adductor long R 50  ,L 50  Adductor mag R 50  ,L 50  Vastus intermedius R  50/50 -> 100 R ,L 50 L Vastus medialis R 50   ,L 50 Rectus Femoris R  50/50 -> 100 x2 spots ,L 75 x2 spots Gastrocnemius R 50, L 50  Continue being active at home in the pool and using your walker on a daily basis to stand, bear weight, and walk.  Stretch your hips and knees daily.  I agree that the ankle brace is likely not benefiting her much, and she does not need to continue this.   I am increasing oral baclofen  to 15 mg 3 times daily.  Given she has been on this medication for over a year, I would like to get some lab work for monitoring, which can be done anytime before next appointment.   Follow-up in the next few weeks for repeat Botox  injections into the quads and adductors; I will also be adding injections into the hips.  We will increase the dosage from 600 units to 1000 units total.  If she does not get better results out of this next set of injections, we may switch to Xeomin.   Please record a video of her walking to show me prior to injections next visit to help with planning.  Interval Hx:  - Therapies: Mom says after injections she is picking up her left leg a little more; right is still dragging. She still uses the walker in the home; [facility] only when going out. No changes in mobility or tolerance. Haven't been in the pool because of the weather. Mom says they do not have the time for [facility].    - Follow ups: none   - Falls: none   - DME: Mom says the wlker has been difficult, the handles are forced down. They tried to  get it fixed but that did not help. Her WC and walker are less than a year old. Within 1 month of coming out they had to make adjustments to it.    - Medications: She stopped taking baclofen  because every time I took it I went to the bathroom. She is chronically constipated but the increased dose made her go a lot; still going a lot since stopping it.    - Other concerns: Mom says her spasms are not nearly as bad as they used to be; now more sporradic.   Pain Inventory Average Pain 0 Pain Right Now 0 My pain is .  In the last 24 hours, has pain interfered with the following? General activity 0 Relation with others 0 Enjoyment of life 0 What TIME of day is your pain at its worst? . Sleep (in general) Good  Pain is worse with: . Pain improves with: . Relief from Meds: .  Family History  Problem Relation Age of Onset   Arthritis Mother    Hypertension Maternal Grandmother    Diabetes Maternal Grandmother    Social History   Socioeconomic History  Marital status: Single    Spouse name: Not on file   Number of children: 0   Years of education: 9   Highest education level: 12th grade  Occupational History   Not on file  Tobacco Use   Smoking status: Never   Smokeless tobacco: Never  Vaping Use   Vaping status: Never Used  Substance and Sexual Activity   Alcohol use: No    Alcohol/week: 0.0 standard drinks of alcohol   Drug use: No   Sexual activity: Never  Other Topics Concern   Not on file  Social History Narrative   Fun: Play and exercise, read   Denies religious beliefs effecting health care.    Social Drivers of Corporate investment banker Strain: Low Risk  (09/19/2023)   Overall Financial Resource Strain (CARDIA)    Difficulty of Paying Living Expenses: Not hard at all  Food Insecurity: No Food Insecurity (09/19/2023)   Hunger Vital Sign    Worried About Running Out of Food in the Last Year: Never true    Ran Out of Food in the Last Year: Never  true  Transportation Needs: No Transportation Needs (09/19/2023)   PRAPARE - Administrator, Civil Service (Medical): No    Lack of Transportation (Non-Medical): No  Physical Activity: Unknown (09/19/2023)   Exercise Vital Sign    Days of Exercise per Week: 7 days    Minutes of Exercise per Session: Patient declined  Stress: No Stress Concern Present (09/19/2023)   [name] - Occupational Stress Questionnaire    Feeling of Stress : Not at all  Social Connections: Socially Isolated (09/19/2023)   Social Connection and Isolation Panel    Frequency of Communication with Friends and Family: More than three times a week    Frequency of Social Gatherings with Friends and Family: More than three times a week    Attends Religious Services: Never    Database administrator or Organizations: No    Attends Engineer, structural: Not on file    Marital Status: Never married   Past Surgical History:  Procedure Laterality Date   feet surgery Bilateral    HEMORRHOID SURGERY N/A 01/22/2018   Procedure: HEMORRHOIDECTOMY SINGLE COLUMN;  Surgeon: Debby Hila, MD;  Location: [facility];  Service: General;  Laterality: N/A;   HIP SURGERY Bilateral yrs ago   KNEE SURGERY Bilateral    REMOVAL OF DRUG DELIVERY IMPLANT Left 03/08/2021   Procedure: REMOVAL OF DRUG DELIVERY IMPLANT (NEXPLANON ) FROM  LEFT UPPER ARM;  Surgeon: Corene Coy, MD;  Location: [facility];  Service: Gynecology;  Laterality: Left;   right wrist surgery     cannot bend at wrist   ROBOTIC ASSISTED TOTAL HYSTERECTOMY Bilateral 03/08/2021   Procedure: XI ROBOTIC ASSISTED TOTAL HYSTERECTOMY WITH SALPINGECTOMY, CYSTOSCOPY;  Surgeon: Corene Coy, MD;  Location: [facility];  Service: Gynecology;  Laterality: Bilateral;   Past Surgical History:  Procedure Laterality Date   feet surgery Bilateral    HEMORRHOID SURGERY N/A 01/22/2018    Procedure: HEMORRHOIDECTOMY SINGLE COLUMN;  Surgeon: Debby Hila, MD;  Location: [facility];  Service: General;  Laterality: N/A;   HIP SURGERY Bilateral yrs ago   KNEE SURGERY Bilateral    REMOVAL OF DRUG DELIVERY IMPLANT Left 03/08/2021   Procedure: REMOVAL OF DRUG DELIVERY IMPLANT (NEXPLANON ) FROM  LEFT UPPER ARM;  Surgeon: Corene Coy, MD;  Location: [facility];  Service: Gynecology;  Laterality: Left;  right wrist surgery     cannot bend at wrist   ROBOTIC ASSISTED TOTAL HYSTERECTOMY Bilateral 03/08/2021   Procedure: XI ROBOTIC ASSISTED TOTAL HYSTERECTOMY WITH SALPINGECTOMY, CYSTOSCOPY;  Surgeon: Corene Coy, MD;  Location: [facility];  Service: Gynecology;  Laterality: Bilateral;   Past Medical History:  Diagnosis Date   Cerebral palsy (HCC)    legs stiff do not bend   Eczema    comes and goes per mother  on neck at times uses cocoa butter prn right neck drying out now per mother   Seizures (HCC)    Childhood seizures - not since age 42   Uses walker    in home for ambulation, uses wheelchair for distance   BP 124/77   Pulse 99   Ht 4' 11 (1.499 m)   Wt 119 lb (54 kg)   LMP 02/15/2021   SpO2 98%   BMI 24.04 kg/m   Opioid Risk Score:   Fall Risk Score:  `1  Depression screen [facility] 2/9     07/02/2024    9:53 AM 06/02/2024   10:46 AM 10/01/2023    9:03 AM 05/23/2023   11:06 AM 02/28/2023    1:02 PM 07/03/2022    1:43 PM 11/17/2020    5:12 PM  Depression screen PHQ 2/9  Decreased Interest 0 0 0 0 1 0 0  Down, Depressed, Hopeless 0 0 0 0 0 0 0  PHQ - 2 Score 0 0 0 0 1 0 0  Altered sleeping     2  0  Tired, decreased energy     0  0  Change in appetite     0  0  Feeling bad or failure about yourself      0  0  Trouble concentrating     0  0  Moving slowly or fidgety/restless     0  0  Suicidal thoughts     0  0  PHQ-9 Score     3  0     Review of Systems     Objective:   Physical Exam   PE: Constitution:  Appropriate appearance for age. No apparent distress  Resp: No respiratory distress. No accessory muscle usage. on RA and CTAB Cardio: Well perfused appearance. No peripheral edema. Abdomen: Nondistended. Nontender.   Psych: Appropriate mood and affect. Neuro: AAOx4. Mild cognitive deficits, mostly in delayed responses; appropriate content.    Neurologic Exam:   + Bilateral rocker bottom feet Sensory exam: revealed  with reduced sensation to light touch in bilateral lower extremities Motor exam: strength 5/5 throughout bilateral upper extremities and with exception of 2+/5 strength RLE hip flexion, knee extension, plantarflexion; 2-/5 LLE hip flexion, knee extension, plantarflexion; 1/5 active dorsiflexion b/l --unchanged 9/29 Coordination: Fine motor coordination in BL UE WNL Gait: Wheelchair ;no apparent breakdown. Cushion intact.  Tone: MAS 3 BL knee extensors, MAS 3 R adductor, MAS 3 L adductor; MAS 2 bilateral plantarflexors , Mas 3 b/l hip flexors   R shoulder ROM reduced to < 90 degrees abduction      Assessment & Plan:   Kerri Harris is a 33 y.o. year old female  who  has a past medical history of Cerebral palsy (HCC), Eczema, Seizures (HCC), and Uses walker.   They are presenting to PM&R clinic for follow up related to spastic diplegic CP .   Spastic diplegic cerebral palsy (HCC) Spasticity  Stop baclofen  due to gastrointestinal side effects.  Start tizanidine at  nighttime for spasms; start with one half tab, then progressed up to 1 tab if it does not make her too sedated.  Come back at the end of October for repeat Botox  for reducing spasms, positioning, and preventing skin breakdown and reducing caregiver burden.  We did briefly discuss neurolysis to the bilateral lower extremities versus baclofen  pump, but given that patient remains ambulatory most of the time we want to maintain some of her tone.  Therefore, we will continue current management.  Impaired mobility and  ADLs I am sending you back to PT with the neurorehab to work on gait with your walker, especially with turns as grandma describes patient forgetting to place down her left foot during these.    Also, they should be able to troubleshoot problems with the walker and talk to nu motion for any adjustments with the handles.  Other orders -     tiZANidine HCl; Take 0.5-1 tablets (2-4 mg total) by mouth at bedtime as needed for muscle spasms.  Dispense: 30 tablet; Refill: 5

## 2024-09-17 NOTE — Therapy (Incomplete)
 OUTPATIENT PHYSICAL THERAPY NEURO EVALUATION   Patient Name: Kerri Harris MRN: [id number] DOB:February 20, 1991, 33 y.o., female Today's Date: 09/17/2024   PCP: Joshua Debby CROME, MD   REFERRING PROVIDER: Emeline Joesph BROCKS, DO   END OF SESSION:   Past Medical History:  Diagnosis Date   Cerebral palsy (HCC)    legs stiff do not bend   Eczema    comes and goes per mother  on neck at times uses cocoa butter prn right neck drying out now per mother   Seizures (HCC)    Childhood seizures - not since age 11   Uses walker    in home for ambulation, uses wheelchair for distance   Past Surgical History:  Procedure Laterality Date   feet surgery Bilateral    HEMORRHOID SURGERY N/A 01/22/2018   Procedure: HEMORRHOIDECTOMY SINGLE COLUMN;  Surgeon: Debby Hila, MD;  Location: [facility];  Service: General;  Laterality: N/A;   HIP SURGERY Bilateral yrs ago   KNEE SURGERY Bilateral    REMOVAL OF DRUG DELIVERY IMPLANT Left 03/08/2021   Procedure: REMOVAL OF DRUG DELIVERY IMPLANT (NEXPLANON ) FROM  LEFT UPPER ARM;  Surgeon: Corene Coy, MD;  Location: [facility];  Service: Gynecology;  Laterality: Left;   right wrist surgery     cannot bend at wrist   ROBOTIC ASSISTED TOTAL HYSTERECTOMY Bilateral 03/08/2021   Procedure: XI ROBOTIC ASSISTED TOTAL HYSTERECTOMY WITH SALPINGECTOMY, CYSTOSCOPY;  Surgeon: Corene Coy, MD;  Location: [facility];  Service: Gynecology;  Laterality: Bilateral;   Patient Active Problem List   Diagnosis Date Noted   Muscle weakness (generalized) 06/20/2023   Other abnormalities of gait and mobility 06/20/2023   Right foot drop 06/20/2023   Spasticity 03/06/2023   Impaired mobility and ADLs 03/06/2023   Implanon  in place 11/17/2020   Rocker bottom foot deformity 09/08/2019   Foot pain, bilateral 09/02/2019   Atopic dermatitis 11/14/2017   Encounter for general adult medical examination with abnormal findings 08/07/2017    Cerebral palsy (HCC) 07/20/2016   Needs assistance while at home 06/11/2015    ONSET DATE: 09/08/2024  REFERRING DIAG: G80.1 (ICD-10-CM) - Spastic diplegic cerebral palsy (HCC) R25.2 (ICD-10-CM) - Spasticity Z74.09,Z78.9 (ICD-10-CM) - Impaired mobility and ADLs    THERAPY DIAG:  No diagnosis found.  Rationale for Evaluation and Treatment: {HABREHAB:27488}  SUBJECTIVE:                                                                                                                                                                                             SUBJECTIVE STATEMENT: *** Pt accompanied by: {accompnied:27141}  PERTINENT HISTORY:  PMH: spastic diplegic CP with b/l LE weakness and spasticity, hx of seizures (not since age 80), Rocker bottom foot deformity   Seen prior; now with increased difficulty using walker, forgetting safety strategies / limb advancement with turns.   PAIN:  Are you having pain? {OPRCPAIN:27236}  PRECAUTIONS: {Therapy precautions:24002}  RED FLAGS: {PT Red Flags:29287}   WEIGHT BEARING RESTRICTIONS: {Yes ***/No:24003}  FALLS: Has patient fallen in last 6 months? {fallsyesno:27318}  LIVING ENVIRONMENT: Lives with: {OPRC lives with:25569::lives with their family} Lives in: {Lives in:25570} Stairs: {opstairs:27293} Has following equipment at home: {Assistive devices:23999}  PLOF: {PLOF:24004}  PATIENT GOALS: ***  OBJECTIVE:  Note: Objective measures were completed at Evaluation unless otherwise noted.  DIAGNOSTIC FINDINGS: ***  COGNITION: Overall cognitive status: {cognition:24006}   SENSATION: {sensation:27233}  COORDINATION: ***  EDEMA:  {edema:24020}  MUSCLE TONE: {LE tone:25568}  MUSCLE LENGTH: Hamstrings: Right *** deg; Left *** deg Debby test: Right *** deg; Left *** deg  DTRs:  {DTR SITE:24025}  POSTURE: {posture:25561}  LOWER EXTREMITY ROM:     {AROM/PROM:27142}  Right Eval Left Eval  Hip flexion     Hip extension    Hip abduction    Hip adduction    Hip internal rotation    Hip external rotation    Knee flexion    Knee extension    Ankle dorsiflexion    Ankle plantarflexion    Ankle inversion    Ankle eversion     (Blank rows = not tested)  LOWER EXTREMITY MMT:    MMT Right Eval Left Eval  Hip flexion    Hip extension    Hip abduction    Hip adduction    Hip internal rotation    Hip external rotation    Knee flexion    Knee extension    Ankle dorsiflexion    Ankle plantarflexion    Ankle inversion    Ankle eversion    (Blank rows = not tested)  BED MOBILITY:  {bed mobility:32615:p}  TRANSFERS: {transfers eval:32620}  RAMP:  {ramp eval:32616}  CURB:  {curb eval:32617}  STAIRS: {stairs eval:32618} GAIT: Findings: {GaitneuroPT:32644::Distance walked: ***,Comments: ***}  FUNCTIONAL TESTS:  {Functional tests:24029}  PATIENT SURVEYS:  {rehab surveys:24030}                                                                                                                              TREATMENT DATE: ***    PATIENT EDUCATION: Education details: *** Person educated: {Person educated:25204} Education method: {Education Method:25205} Education comprehension: {Education Comprehension:25206}  HOME EXERCISE PROGRAM: ***  GOALS: Goals reviewed with patient? {yes/no:20286}  SHORT TERM GOALS: Target date: ***  *** Baseline: Goal status: INITIAL  2.  *** Baseline:  Goal status: INITIAL  3.  *** Baseline:  Goal status: INITIAL  4.  *** Baseline:  Goal status: INITIAL  5.  *** Baseline:  Goal status: INITIAL  6.  *** Baseline:  Goal status: INITIAL  LONG TERM GOALS: Target  date: ***  *** Baseline:  Goal status: INITIAL  2.  *** Baseline:  Goal status: INITIAL  3.  *** Baseline:  Goal status: INITIAL  4.  *** Baseline:  Goal status: INITIAL  5.  *** Baseline:  Goal status: INITIAL  6.  *** Baseline:  Goal  status: INITIAL  ASSESSMENT:  CLINICAL IMPRESSION: Patient is a *** y.o. *** who was seen today for physical therapy evaluation and treatment for ***.   OBJECTIVE IMPAIRMENTS: {opptimpairments:25111}.   ACTIVITY LIMITATIONS: {activitylimitations:27494}  PARTICIPATION LIMITATIONS: {participationrestrictions:25113}  PERSONAL FACTORS: {Personal factors:25162} are also affecting patient's functional outcome.   REHAB POTENTIAL: {rehabpotential:25112}  CLINICAL DECISION MAKING: {clinical decision making:25114}  EVALUATION COMPLEXITY: {Evaluation complexity:25115}  PLAN:  PT FREQUENCY: {rehab frequency:25116}  PT DURATION: {rehab duration:25117}  PLANNED INTERVENTIONS: {rehab planned interventions:25118::97110-Therapeutic exercises,97530- Therapeutic [phone]- Neuromuscular re-education,97535- Self Rjmz,02859- Manual therapy,Patient/Family education}  PLAN FOR NEXT SESSION: ***   [place]ffield LOISE Senate, PT, DPT 09/17/2024, 8:36 AM

## 2024-09-18 ENCOUNTER — Ambulatory Visit: Admitting: Physical Therapy

## 2024-09-19 ENCOUNTER — Ambulatory Visit: Attending: Physical Medicine and Rehabilitation | Admitting: Physical Therapy

## 2024-09-19 ENCOUNTER — Other Ambulatory Visit: Payer: Self-pay

## 2024-09-19 ENCOUNTER — Encounter: Payer: Self-pay | Admitting: Physical Therapy

## 2024-09-19 VITALS — BP 125/61 | HR 88

## 2024-09-19 DIAGNOSIS — R2689 Other abnormalities of gait and mobility: Secondary | ICD-10-CM | POA: Insufficient documentation

## 2024-09-19 DIAGNOSIS — R293 Abnormal posture: Secondary | ICD-10-CM | POA: Insufficient documentation

## 2024-09-19 DIAGNOSIS — R252 Cramp and spasm: Secondary | ICD-10-CM | POA: Insufficient documentation

## 2024-09-19 DIAGNOSIS — M21371 Foot drop, right foot: Secondary | ICD-10-CM | POA: Insufficient documentation

## 2024-09-19 DIAGNOSIS — Z7409 Other reduced mobility: Secondary | ICD-10-CM | POA: Diagnosis not present

## 2024-09-19 DIAGNOSIS — G801 Spastic diplegic cerebral palsy: Secondary | ICD-10-CM | POA: Diagnosis not present

## 2024-09-19 DIAGNOSIS — Z789 Other specified health status: Secondary | ICD-10-CM | POA: Diagnosis not present

## 2024-09-19 DIAGNOSIS — M6281 Muscle weakness (generalized): Secondary | ICD-10-CM | POA: Diagnosis present

## 2024-09-19 DIAGNOSIS — G809 Cerebral palsy, unspecified: Secondary | ICD-10-CM | POA: Insufficient documentation

## 2024-09-19 NOTE — Therapy (Signed)
 OUTPATIENT PHYSICAL THERAPY NEURO EVALUATION   Patient Name: Kerri Harris MRN: [id number] DOB:1991-03-02, 33 y.o., female Today's Date: 09/19/2024   PCP: Joshua Debby CROME, MD REFERRING PROVIDER: Emeline Joesph BROCKS, DO  END OF SESSION:  PT End of Session - 09/19/24 1234     Visit Number 1    Number of Visits 11   10 visits plus Eval   Date for Recertification  11/14/24   For scheduling delays   Authorization Type [employer]    PT Start Time 1230    PT Stop Time 1313    PT Time Calculation (min) 43 min    Equipment Utilized During Treatment Gait belt    Activity Tolerance Patient tolerated treatment well    Behavior During Therapy --   Flat affect intermittently         Past Medical History:  Diagnosis Date   Cerebral palsy (HCC)    legs stiff do not bend   Eczema    comes and goes per mother  on neck at times uses cocoa butter prn right neck drying out now per mother   Seizures (HCC)    Childhood seizures - not since age 79   Uses walker    in home for ambulation, uses wheelchair for distance   Past Surgical History:  Procedure Laterality Date   feet surgery Bilateral    HEMORRHOID SURGERY N/A 01/22/2018   Procedure: HEMORRHOIDECTOMY SINGLE COLUMN;  Surgeon: Debby Hila, MD;  Location: [facility];  Service: General;  Laterality: N/A;   HIP SURGERY Bilateral yrs ago   KNEE SURGERY Bilateral    REMOVAL OF DRUG DELIVERY IMPLANT Left 03/08/2021   Procedure: REMOVAL OF DRUG DELIVERY IMPLANT (NEXPLANON ) FROM  LEFT UPPER ARM;  Surgeon: Corene Coy, MD;  Location: [facility];  Service: Gynecology;  Laterality: Left;   right wrist surgery     cannot bend at wrist   ROBOTIC ASSISTED TOTAL HYSTERECTOMY Bilateral 03/08/2021   Procedure: XI ROBOTIC ASSISTED TOTAL HYSTERECTOMY WITH SALPINGECTOMY, CYSTOSCOPY;  Surgeon: Corene Coy, MD;  Location: [facility] [place];  Service: Gynecology;  Laterality: Bilateral;   Patient Active Problem List    Diagnosis Date Noted   Muscle weakness (generalized) 06/20/2023   Other abnormalities of gait and mobility 06/20/2023   Right foot drop 06/20/2023   Spasticity 03/06/2023   Impaired mobility and ADLs 03/06/2023   Implanon  in place 11/17/2020   Rocker bottom foot deformity 09/08/2019   Foot pain, bilateral 09/02/2019   Atopic dermatitis 11/14/2017   Encounter for general adult medical examination with abnormal findings 08/07/2017   Cerebral palsy (HCC) 07/20/2016   Needs assistance while at home 06/11/2015    ONSET DATE: 09/08/2024 (Date of referral)  REFERRING DIAG: G80.1 (ICD-10-CM) - Spastic diplegic cerebral palsy (HCC) R25.2 (ICD-10-CM) - Spasticity Z74.09,Z78.9 (ICD-10-CM) - Impaired mobility and ADLs  THERAPY DIAG:  Muscle weakness (generalized)  Other abnormalities of gait and mobility  Cerebral palsy, unspecified type (HCC)  Abnormal posture  Rationale for Evaluation and Treatment: Rehabilitation  SUBJECTIVE:  SUBJECTIVE STATEMENT: Pt's mom reports pt could benefit from therapy to improve walking (L>R dragging feet) and has difficulty with turns.  Getting botox  to help with spasticity.  Handlebars sliding down on posterior walker (company came out to fix it but fix did not work); got walker and w/c earlier this year. November 3rd next Botox  (every 4 months) Pt accompanied by: self and mom; grandma and aunt in waiting room  PERTINENT HISTORY: Pt is a 33 y.o. female with PMH of spastic diplegic CP and uses walker.  Per PT referral: Evaluate and treat. Seen prior; now with increased difficulty using walker, forgetting safety strategies / limb advancement with turns.  PMH includes: spastic diplegia CP, seizures, B feet sx, B hip and knee sx, R wrist sx (cannot bend at wrist).  PAIN:  Are  you having pain? No  PRECAUTIONS: Fall  RED FLAGS: None   WEIGHT BEARING RESTRICTIONS: No  FALLS: Has patient fallen in last 6 months? No  LIVING ENVIRONMENT: Lives with: lives with their family (Mom and Dad) Lives in: House/apartment Bedroom main level Stairs: Yes: External: 1 plus 1 steps; none; 1 assist in/out of home (assist to lift walker) Has following equipment at home: Wheelchair (manual), Grab bars, and posterior walker (Crocodile by R82)  PLOF: Uses walker in home; w/c used when going out.  Modified independent with walking, showering, dressing, cleaning room.  PATIENT GOALS: Pt states she wants to get stronger with legs and be able to bend legs better.  Pt's mom wants pt to be able to bend legs better to get in/out of car.  OBJECTIVE:  Note: Objective measures were completed at Evaluation unless otherwise noted.  COGNITION: Overall cognitive status: History of cognitive impairments - at baseline   SENSATION: Light touch: WFL  COORDINATION: Unable to assess d/t significant LE extensor tone  MUSCLE TONE: Significant B LE extensor tone  POSTURE: Mild R lean; posterior trunk lean with B LE knees almost straight in extension in sitting (d/t LE tone)  LOWER EXTREMITY ROM:     Passive  Right Eval Left Eval  Hip flexion Approximately 90 degrees Approximately 75 degrees  Hip extension    Hip abduction    Hip adduction    Hip internal rotation    Hip external rotation    Knee flexion 85 degrees 70 degrees  Knee extension Neutral Neutral  Ankle dorsiflexion Positioned in PF with minimal AROM Positioned in PF with minimal AROM  Ankle plantarflexion    Ankle inversion    Ankle eversion     (Blank rows = not tested)  LOWER EXTREMITY MMT:    MMT Right Eval Left Eval  Hip flexion    Hip extension    Hip abduction    Hip adduction    Hip internal rotation    Hip external rotation    Knee flexion    Knee extension    Ankle dorsiflexion 2/5 2/5  Ankle  plantarflexion    Ankle inversion    Ankle eversion    (Blank rows = not tested)  BED MOBILITY:  Modified independent (uses walker) at baseline per pt and pt's mom report  TRANSFERS: Modified independent with walker  GAIT: Findings: flexed trunk; B LE's appearing stiff (d/t tone); decreased foot clearance initially noted on R but then was more pronounced on the L (pt intermittently catching feet on floor); B LE ER with wider BOS; ataxic gait; using posterior walker; ambulated clinic distances; SBA for safety  FUNCTIONAL TESTS:  10 meter  walk test: 0.337 m/sec (29.66 seconds; used posterior walker)  PATIENT SURVEYS:  TBA  VITALS: Vitals:   09/19/24 1242  BP: 125/61  Pulse: 88                                                                                                                           TREATMENT DATE: 09/19/24  PATIENT EDUCATION: Education details: Eval findings; POC Person educated: Patient and Parent Education method: Explanation, Demonstration, and Verbal cues Education comprehension: verbalized understanding and needs further education  HOME EXERCISE PROGRAM: 09/19/24 ***  GOALS: Goals reviewed with patient? Yes  SHORT TERM GOALS: Target date: 10/17/2024  Pt will be independent with initial HEP in order to improve strength and balance in order to decrease fall risk and improve function at home for ADL's and at work. Baseline: Goal status: INITIAL  2.  *** Baseline:  Goal status: INITIAL  3.  *** Baseline:  Goal status: INITIAL  4.  *** Baseline:  Goal status: INITIAL  5.  *** Baseline:  Goal status: INITIAL  6.  *** Baseline:  Goal status: INITIAL  LONG TERM GOALS: Target date: 10/31/2024  Pt will be independent with final HEP in order to improve strength and balance in order to decrease fall risk and improve function at home for ADL's and at work. Baseline:  Goal status: INITIAL  2.  Pt will increase by at least 0.13 m/s in  order to demonstrate clinically significant improvement in community ambulation. Baseline: 0.337 m/sec (Eval) Goal status: INITIAL  3.  *** Baseline:  Goal status: INITIAL  4.  *** Baseline:  Goal status: INITIAL  5.  *** Baseline:  Goal status: INITIAL  6.  *** Baseline:  Goal status: INITIAL  ASSESSMENT:  CLINICAL IMPRESSION: Patient is a 33 y.o. female who was seen today for physical therapy evaluation and treatment for spastic diplegic cerebral palsy.  Patient presents with significant increased LE tone causing LE ROM, transfer, and gait impairments. These impairments are limiting patient from functional mobility, ambulation, and getting in/out of car.  Evaluation included the following assessment tools: .  Pt scored 0.337 m/sec on the 10 Meter Walk Test indicating pt is a household Ambulator and increased fall risk.  Pt is scheduled for Botox  on November 3rd; d/t this will start therapy initially with 1x/week for 2 weeks and then 2x/week for 4 weeks once pt has received Botox  to assist with spasticity.  Patient will benefit from skilled PT to address noted impairments, improve overall function, and progress towards long term goals.  OBJECTIVE IMPAIRMENTS: Abnormal gait, decreased balance, decreased endurance, decreased mobility, difficulty walking, decreased ROM, decreased strength, impaired flexibility, impaired tone, improper body mechanics, and postural dysfunction.   ACTIVITY LIMITATIONS: carrying, lifting, bending, sitting, standing, squatting, stairs, transfers, bed mobility, bathing, toileting, dressing, and locomotion level  PARTICIPATION LIMITATIONS: meal prep, cleaning, laundry, driving, shopping, community activity, occupation, and yard work  PERSONAL FACTORS: Past/current experiences, Time since onset of injury/illness/exacerbation,  and 3+ comorbidities: spastic diplegia CP, seizures, B feet sx, B hip and knee sx, R wrist sx are also affecting patient's functional  outcome.   REHAB POTENTIAL: Fair d/t spasticity  CLINICAL DECISION MAKING: Evolving/moderate complexity  EVALUATION COMPLEXITY: Moderate  PLAN:  PT FREQUENCY: 1-2x/week (1x/week for 2 weeks then 2x/week for 4 weeks after receiving botox )  PT DURATION: 6 weeks  PLANNED INTERVENTIONS: 02835- PT Re-evaluation, 97750- Physical Performance Testing, 97110-Therapeutic exercises, 97530- Therapeutic activity, [id number]- Neuromuscular re-education, 97535- Self Care, 02859- Manual therapy, [id number]- Gait training, [phone]- Orthotic Initial, [phone]- Orthotic/Prosthetic subsequent, [phone]- Aquatic Therapy, [phone]- Splinting, [phone]- Electrical stimulation (manual), [phone]- Ultrasound, Patient/Family education, Balance training, Stair training, Taping, Joint mobilization, Spinal mobilization, DME instructions, Wheelchair mobility training, Cryotherapy, Moist heat, and Biofeedback  PLAN FOR NEXT SESSION: Options to fix walker?; LE stretching/ROM; functional strengthening; balance; gait training (improve foot clearance); work on Arts development officer with walker   [employer], PT 09/19/2024, 10:17 PM

## 2024-09-22 ENCOUNTER — Encounter: Admitting: Internal Medicine

## 2024-09-24 ENCOUNTER — Ambulatory Visit: Admitting: Physical Therapy

## 2024-09-29 ENCOUNTER — Ambulatory Visit: Admitting: Physical Therapy

## 2024-10-02 ENCOUNTER — Ambulatory Visit: Admitting: Physical Therapy

## 2024-10-02 DIAGNOSIS — M21371 Foot drop, right foot: Secondary | ICD-10-CM

## 2024-10-02 DIAGNOSIS — R293 Abnormal posture: Secondary | ICD-10-CM

## 2024-10-02 DIAGNOSIS — M6281 Muscle weakness (generalized): Secondary | ICD-10-CM | POA: Diagnosis not present

## 2024-10-02 DIAGNOSIS — G809 Cerebral palsy, unspecified: Secondary | ICD-10-CM

## 2024-10-02 DIAGNOSIS — R2689 Other abnormalities of gait and mobility: Secondary | ICD-10-CM

## 2024-10-02 NOTE — Therapy (Signed)
 OUTPATIENT PHYSICAL THERAPY NEURO TREATMENT   Patient Name: Kerri Harris MRN: [id number] DOB:1991-08-31, 33 y.o., female Today's Date: 10/02/2024   PCP: Joshua Debby CROME, MD REFERRING PROVIDER: Emeline Joesph BROCKS, DO  END OF SESSION:  PT End of Session - 10/02/24 1153     Visit Number 2    Number of Visits 11   10 visits plus Eval   Date for Recertification  11/14/24   For scheduling delays   Authorization Type [employer]    PT Start Time 1148    PT Stop Time 1232    PT Time Calculation (min) 44 min    Equipment Utilized During Treatment Gait belt    Activity Tolerance Patient tolerated treatment well    Behavior During Therapy --   Flat affect intermittently          Past Medical History:  Diagnosis Date   Cerebral palsy (HCC)    legs stiff do not bend   Eczema    comes and goes per mother  on neck at times uses cocoa butter prn right neck drying out now per mother   Seizures (HCC)    Childhood seizures - not since age 10   Uses walker    in home for ambulation, uses wheelchair for distance   Past Surgical History:  Procedure Laterality Date   feet surgery Bilateral    HEMORRHOID SURGERY N/A 01/22/2018   Procedure: HEMORRHOIDECTOMY SINGLE COLUMN;  Surgeon: Debby Hila, MD;  Location: [facility];  Service: General;  Laterality: N/A;   HIP SURGERY Bilateral yrs ago   KNEE SURGERY Bilateral    REMOVAL OF DRUG DELIVERY IMPLANT Left 03/08/2021   Procedure: REMOVAL OF DRUG DELIVERY IMPLANT (NEXPLANON ) FROM  LEFT UPPER ARM;  Surgeon: Corene Coy, MD;  Location:  [place];  Service: Gynecology;  Laterality: Left;   right wrist surgery     cannot bend at wrist   ROBOTIC ASSISTED TOTAL HYSTERECTOMY Bilateral 03/08/2021   Procedure: XI ROBOTIC ASSISTED TOTAL HYSTERECTOMY WITH SALPINGECTOMY, CYSTOSCOPY;  Surgeon: Corene Coy, MD;  Location: [facility] ;  Service: Gynecology;  Laterality: Bilateral;   Patient Active Problem List    Diagnosis Date Noted   Muscle weakness (generalized) 06/20/2023   Other abnormalities of gait and mobility 06/20/2023   Right foot drop 06/20/2023   Spasticity 03/06/2023   Impaired mobility and ADLs 03/06/2023   Implanon  in place 11/17/2020   Rocker bottom foot deformity 09/08/2019   Foot pain, bilateral 09/02/2019   Atopic dermatitis 11/14/2017   Encounter for general adult medical examination with abnormal findings 08/07/2017   Cerebral palsy (HCC) 07/20/2016   Needs assistance while at home 06/11/2015    ONSET DATE: 09/08/2024 (Date of referral)  REFERRING DIAG: G80.1 (ICD-10-CM) - Spastic diplegic cerebral palsy (HCC) R25.2 (ICD-10-CM) - Spasticity Z74.09,Z78.9 (ICD-10-CM) - Impaired mobility and ADLs  THERAPY DIAG:  Muscle weakness (generalized)  Other abnormalities of gait and mobility  Cerebral palsy, unspecified type (HCC)  Abnormal posture  Right foot drop  Rationale for Evaluation and Treatment: Rehabilitation  SUBJECTIVE:  SUBJECTIVE STATEMENT: Pt's mom reports pt could benefit from therapy to improve walking (L>R dragging feet) and difficulty with turns.  Getting botox  to help with spasticity.  Handlebars sliding down on posterior walker (company came out to fix it but fix did not work); got walker and w/c earlier this year. November 3rd next Botox  (every 4 months) Pt accompanied by: self and mom; grandma and aunt in waiting room  ***Pt denies any acute changes since last visit. No complaints of pain today.  PERTINENT HISTORY: Pt is a 33 y.o. female with PMH of spastic diplegic CP and uses walker.  Per PT referral: Evaluate and treat. Seen prior; now with increased difficulty using walker, forgetting safety strategies / limb advancement with turns.  PMH includes: spastic  diplegia CP, seizures, B feet sx, B hip and knee sx, R wrist sx (cannot bend at wrist).  PAIN:  Are you having pain? No  PRECAUTIONS: Fall  RED FLAGS: None   WEIGHT BEARING RESTRICTIONS: No  FALLS: Has patient fallen in last 6 months? No  LIVING ENVIRONMENT: Lives with: lives with their family (Mom and Dad) Lives in: House/apartment Bedroom main level Stairs: Yes: External: 1 plus 1 steps; none; 1 assist in/out of home (assist to lift walker) Has following equipment at home: Wheelchair (manual), Grab bars, and posterior walker (Crocodile by R82)  PLOF: Uses walker in home; w/c used when going out.  Modified independent with walking, showering, dressing, cleaning room.  PATIENT GOALS: Pt states she wants to get stronger with legs and be able to bend legs better.  Pt's mom wants pt to be able to bend legs better to get in/out of car.  OBJECTIVE:  Note: Objective measures were completed at Evaluation unless otherwise noted.  COGNITION: Overall cognitive status: History of cognitive impairments - at baseline   SENSATION: Light touch: WFL  COORDINATION: Unable to assess d/t significant LE extensor tone  MUSCLE TONE: Significant B LE extensor tone (L>R)  POSTURE: Mild R lean; posterior trunk lean with B LE knees almost straight in extension in sitting (d/t increased LE tone)  LOWER EXTREMITY ROM:     Passive  Right Eval Left Eval  Hip flexion  90 degrees 75 degrees  Hip extension    Hip abduction    Hip adduction    Hip internal rotation    Hip external rotation    Knee flexion 85 degrees 70 degrees  Knee extension Neutral Neutral  Ankle dorsiflexion Positioned in PF Positioned in PF  Ankle plantarflexion    Ankle inversion    Ankle eversion     (Blank rows = not tested)  LOWER EXTREMITY MMT:  Difficult to assess d/t increased B LE tone  MMT Right Eval Left Eval  Hip flexion    Hip extension    Hip abduction    Hip adduction    Hip internal rotation     Hip external rotation    Knee flexion    Knee extension    Ankle dorsiflexion 2/5 2/5  Ankle plantarflexion    Ankle inversion    Ankle eversion    (Blank rows = not tested)  BED MOBILITY:  Modified independent (uses walker) at baseline per pt and pt's mom report  TRANSFERS: SBA with walker (increased effort and time to perform on own with adaptive strategies; vc's required to improve technique)  GAIT: Findings: flexed trunk; B LE's appearing stiff (d/t tone); decreased foot clearance initially noted more on R but then was more pronounced on  the L (pt intermittently catching feet on floor); B LE ER with wider BOS; ataxic gait; using posterior walker; ambulated clinic distances; SBA for safety  FUNCTIONAL TESTS:  10 meter walk test: 0.337 m/sec (29.66 seconds; used posterior walker)  PATIENT SURVEYS:  TBA  VITALS: There were no vitals filed for this visit.                                                                                                                          TREATMENT DATE:    Gait with crocodile backwards walker Onset of RLE weakness, decreased hip flexion, knee flex, ankle   Working on turns to the R and to the L, more difficult to turn to the R  Need tools to tighten walker handle, suggested super glue?  Worked on forwards/bacwkards pole stepovers -difficulty with R knee flexion due to spasticity  PATIENT EDUCATION: Education details: Eval findings; POC*** Person educated: Patient and Parent Education method: Explanation, Demonstration, and Verbal cues Education comprehension: verbalized understanding and needs further education  HOME EXERCISE PROGRAM: 09/19/24   GOALS: Goals reviewed with patient? Yes  SHORT TERM GOALS: Target date: 10/17/2024  Pt will be supervision with initial HEP in order to improve strength, ROM, and balance in order to decrease fall risk and improve function at home for ADL's. Baseline: TBA Goal status:  INITIAL  2.  Assess B hip PROM flexion after pt receives Botox  and adjust long term goal PROM if needed (to improve transfers and getting in/out car). Baseline: 75 degrees L hip (Eval) Goal status: INITIAL  3.  Assess B knee flexion PROM after pt receives Botox  and adjust long term goal PROM if needed (to improve transfers and getting in/out of car). Baseline: R knee 85 degrees and L knee 70 degrees (Eval) Goal status: INITIAL   LONG TERM GOALS: Target date: 10/31/2024  Pt will be supervision with final HEP in order to improve strength, ROM, and balance in order to decrease fall risk and improve function at home for ADL's. Baseline: TBA Goal status: INITIAL  2.  Pt will increase by at least 0.13 m/s in order to demonstrate clinically significant improvement in community ambulation. Baseline: 0.337 m/sec (Eval) Goal status: INITIAL  3.  Pt will increase B hip PROM flexion to at least 100 degrees to improve transfers and getting in/out car. Baseline: 75 degrees L; 90 degrees R (Eval) Goal status: INITIAL  4.  Pt will increase B knee flexion PROM to 100 degrees to improve transfers and getting in/out of car. Baseline: R knee 85 degrees and L knee 70 degrees (Eval) Goal status: INITIAL  5.  Pt will demonstrate improved safety strategies and LE limb advancement with turns using walker. Baseline: SBA and vc's required to improve technique Goal status: INITIAL   ASSESSMENT:  CLINICAL IMPRESSION: Patient is a 33 y.o. female who was seen today for physical therapy evaluation and treatment for spastic diplegic cerebral palsy.  Patient presents with significant increased  LE tone causing LE ROM, transfer, and gait impairments. These impairments are limiting patient from functional mobility, ambulation, and getting in/out of a car.  Evaluation included the following assessment tools: .  Pt scored 0.337 m/sec on the 10 Meter Walk Test indicating pt is a household Ambulator and  increased fall risk.  L LE>R LE tone noted.  Pt is scheduled for Botox  on November 3rd per family report; d/t this will start therapy initially with 1x/week for 2 weeks and then 2x/week for 4 weeks once pt has received Botox  to assist with spasticity.  Patient will benefit from skilled PT to address noted impairments, improve overall function, and progress towards long term goals.   Emphasis of skilled PT session*** Continue POC.   OBJECTIVE IMPAIRMENTS: Abnormal gait, decreased balance, decreased endurance, decreased mobility, difficulty walking, decreased ROM, decreased strength, impaired flexibility, impaired tone, improper body mechanics, and postural dysfunction.   ACTIVITY LIMITATIONS: carrying, lifting, bending, sitting, standing, squatting, stairs, transfers, bed mobility, bathing, toileting, dressing, and locomotion level  PARTICIPATION LIMITATIONS: meal prep, cleaning, laundry, driving, shopping, community activity, occupation, and yard work  PERSONAL FACTORS: Past/current experiences, Time since onset of injury/illness/exacerbation, and 3+ comorbidities: spastic diplegia CP, seizures, B feet sx, B hip and knee sx, R wrist sx are also affecting patient's functional outcome.   REHAB POTENTIAL: Fair d/t spasticity and time since onset of injury  CLINICAL DECISION MAKING: Evolving/moderate complexity  EVALUATION COMPLEXITY: Moderate  PLAN:  PT FREQUENCY: 1-2x/week (1x/week for 2 weeks then 2x/week for 4 weeks after receiving botox )  PT DURATION: 6 weeks  PLANNED INTERVENTIONS: 02835- PT Re-evaluation, 97750- Physical Performance Testing, 97110-Therapeutic exercises, 97530- Therapeutic activity, [id number]- Neuromuscular re-education, 97535- Self Care, 02859- Manual therapy, [id number]- Gait training, [phone]- Orthotic Initial, [phone]- Orthotic/Prosthetic subsequent, [phone]- Aquatic Therapy, [phone]- Splinting, [phone]- Electrical stimulation (manual), [phone]- Ultrasound, Patient/Family education,  Balance training, Stair training, Taping, Joint mobilization, Spinal mobilization, Cognitive remediation, DME instructions, Wheelchair mobility training, Cryotherapy, Moist heat, and Biofeedback  PLAN FOR NEXT SESSION: Options to fix walker?; LE stretching/ROM; functional strengthening; balance; gait training (improve foot clearance); work on arts development officer with walker***family to bring R AFO to a future session, look at R knee flexion and hip flexion   Waddell Southgate, PT Waddell Southgate, PT, DPT, CSRS  10/02/2024, 12:35 PM

## 2024-10-06 ENCOUNTER — Encounter: Payer: Self-pay | Admitting: Physical Medicine and Rehabilitation

## 2024-10-06 ENCOUNTER — Encounter: Attending: Physical Medicine and Rehabilitation | Admitting: Physical Medicine and Rehabilitation

## 2024-10-06 VITALS — BP 122/69 | HR 94 | Ht 59.0 in | Wt 119.0 lb

## 2024-10-06 DIAGNOSIS — G801 Spastic diplegic cerebral palsy: Secondary | ICD-10-CM | POA: Insufficient documentation

## 2024-10-06 DIAGNOSIS — R252 Cramp and spasm: Secondary | ICD-10-CM | POA: Diagnosis not present

## 2024-10-06 MED ORDER — ONABOTULINUMTOXINA 100 UNITS IJ SOLR
800.0000 [IU] | Freq: Once | INTRAMUSCULAR | Status: AC
  Administered 2024-10-06: 800 [IU] via INTRAMUSCULAR

## 2024-10-06 MED ORDER — SODIUM CHLORIDE (PF) 0.9 % IJ SOLN
8.0000 mL | Freq: Once | INTRAMUSCULAR | Status: AC
  Administered 2024-10-06: 8 mL

## 2024-10-06 NOTE — Progress Notes (Signed)
  No changes or concerns. Has restarted PT, feeling good. Kerri Harris is trying to encourage use of her new braces and use of her walker. Has only been taking 1/2 tabs tizanidine, no s/e, ok with increasing to a whole pill.  Botolulinum Toxin Injection: [x ] BOTOX  (onabotulinumtoxinA ) [_] DYSPORT (abobotulinumtoxinA) [_] Xeomin (Incobotulinum toxin A)   Goals with treatment: [ x] Decrease spasms/ abnormal movements [x ] Improve Active / Passive ROM [ x] Improve ADLs [x ] Improve functional mobility [ x] Improve gait mechanics [x ] Improve positioning/posture [x ] Prevent contracture  [ ]  Prevent joint destruction [ ]  Prevent skin breakdown [x ] Decrease caregiver burden [ ]  Improve hygiene [ ]  Improve Pain   MEDICATION:  ONAbotulinum toxin. 800 Units     NaCl 8 ml     CONSENT: Obtained in writing followed by time-out per policy. Consent uploaded to chart.   Benefits discussed included, but were not limited to, decreased muscle tightness and spasticity, increased joint range of motion, improved limb positioning and facilitation of hygiene and nursing care.    Risks discussed included, but were not limited to, pain and discomfort, bleeding, bruising, excessive weakness, venous thrombosis, muscle atrophy, and distant spread of toxin which could include generalized muscle weakness, diplopia, blurred vision, ptosis, dysphagia, dysphonia, dysarthria, urinary incontinence and breathing difficulties. These symptoms have been reported hours to weeks after injection. Swallowing and breathing difficulties may be life threatening, and there have been reports of death. Patient/Family member/Guardian/Caregiver have been offered botulinum toxin informational material upon initial consultation and this information has been continually available. All questions answered to patient/family member/guardian/ caregiver satisfaction. They would like to proceed with procedure. There are no noted contraindications  to procedure.   PROCEDURE [x]  Without Ultrasound: Patient was placed in a position with the appropriate muscles exposed, located and identified. The skin was cleaned with ChloraPrep and ethyl chloride was sprayed for topical anesthetic. Using combination EMG amplification and electrical stimulation, the following muscles were identified using anatomical landmarks described by Perotto, et al (1994) and injected following aspiration to ensure blood vessels were avoided.   [_ ] With Ultrasound: Patient was placed in a position with the appropriate muscles exposed, located and identified. The skin was cleaned with ChloraPrep and ethyl chloride was sprayed for topical anesthetic. Using combination Ultrasound guidance for anatomical guidance, avoidance of significant vasculature, to decrease the risk of hematoma formation and ensure botox  was placed in the correct location; EMG amplification and electrical stimulation, the following muscles were identified and injected following aspiration to ensure blood vessels were avoided. A _linear transducer was used during the procedure. The botulinum toxin was visualized entering the appropriate musculature.    MUSCLE: Units /Sites: 800 U total  Iliopsoas - 50 U R, 25 U L Adductor long R 50  ,L 50  Adductor mag R 50  ,L 50  Vastus intermedius R  50/50 -> 75 R ,L 50 L Vastus medialis R 50   ,L 50 Rectus Femoris R  50/50 -> 100 x2 spots ,L 100 x2 spots Gastrocnemius R 50, L 50   800 Units were injected without difficulty. No complications were encountered. The patient tolerated the procedure well. Wasted 0 U     PLAN: - Resume Usual Activities. Notify Physician of any unusual bleeding, erythema or concern for side effects as reviewed above. - Apply ice prn for pain - Tylenol  prn for pain - Follow up in 6-8 weeks to assess response to injection

## 2024-10-06 NOTE — Patient Instructions (Signed)
-   Resume Usual Activities. Notify Physician of any unusual bleeding, erythema or concern for side effects as reviewed above. - Apply ice prn for pain - Tylenol prn for pain - Follow up in 6-8 weeks to assess response to injection

## 2024-10-08 ENCOUNTER — Ambulatory Visit: Attending: Physical Medicine and Rehabilitation | Admitting: Physical Therapy

## 2024-10-08 ENCOUNTER — Encounter: Payer: Self-pay | Admitting: Physical Therapy

## 2024-10-08 DIAGNOSIS — G809 Cerebral palsy, unspecified: Secondary | ICD-10-CM | POA: Insufficient documentation

## 2024-10-08 DIAGNOSIS — M6281 Muscle weakness (generalized): Secondary | ICD-10-CM | POA: Insufficient documentation

## 2024-10-08 DIAGNOSIS — M21371 Foot drop, right foot: Secondary | ICD-10-CM | POA: Insufficient documentation

## 2024-10-08 DIAGNOSIS — R2689 Other abnormalities of gait and mobility: Secondary | ICD-10-CM | POA: Diagnosis present

## 2024-10-08 DIAGNOSIS — R293 Abnormal posture: Secondary | ICD-10-CM | POA: Insufficient documentation

## 2024-10-08 NOTE — Therapy (Signed)
 OUTPATIENT PHYSICAL THERAPY NEURO TREATMENT   Patient Name: Kerri Harris MRN: [id number] DOB:02-14-1991, 33 y.o., female Today's Date: 10/08/2024   PCP: Joshua Debby CROME, MD REFERRING PROVIDER: Emeline Joesph BROCKS, DO  END OF SESSION:  PT End of Session - 10/08/24 1233     Visit Number 3    Number of Visits 11   10 visits plus Eval   Date for Recertification  11/14/24   For scheduling delays   Authorization Type [employer]    Authorization Time Period 09/19/24-12/18/24    Authorization - Visit Number 3    Authorization - Number of Visits 12    PT Start Time 1228    PT Stop Time 1323    PT Time Calculation (min) 55 min    Equipment Utilized During Treatment --   posterior walker   Activity Tolerance Patient tolerated treatment well    Behavior During Therapy --   Flat affect intermittently          Past Medical History:  Diagnosis Date   Cerebral palsy (HCC)    legs stiff do not bend   Eczema    comes and goes per mother  on neck at times uses cocoa butter prn right neck drying out now per mother   Seizures (HCC)    Childhood seizures - not since age 51   Uses walker    in home for ambulation, uses wheelchair for distance   Past Surgical History:  Procedure Laterality Date   feet surgery Bilateral    HEMORRHOID SURGERY N/A 01/22/2018   Procedure: HEMORRHOIDECTOMY SINGLE COLUMN;  Surgeon: Debby Hila, MD;  Location: [facility];  Service: General;  Laterality: N/A;   HIP SURGERY Bilateral yrs ago   KNEE SURGERY Bilateral    REMOVAL OF DRUG DELIVERY IMPLANT Left 03/08/2021   Procedure: REMOVAL OF DRUG DELIVERY IMPLANT (NEXPLANON ) FROM  LEFT UPPER ARM;  Surgeon: Corene Coy, MD;  Location: [facility];  Service: Gynecology;  Laterality: Left;   right wrist surgery     cannot bend at wrist   ROBOTIC ASSISTED TOTAL HYSTERECTOMY Bilateral 03/08/2021   Procedure: XI ROBOTIC ASSISTED TOTAL HYSTERECTOMY WITH SALPINGECTOMY, CYSTOSCOPY;  Surgeon: Corene Coy, MD;  Location: [facility];  Service: Gynecology;  Laterality: Bilateral;   Patient Active Problem List   Diagnosis Date Noted   Muscle weakness (generalized) 06/20/2023   Other abnormalities of gait and mobility 06/20/2023   Right foot drop 06/20/2023   Spasticity 03/06/2023   Impaired mobility and ADLs 03/06/2023   Implanon  in place 11/17/2020   Rocker bottom foot deformity 09/08/2019   Foot pain, bilateral 09/02/2019   Atopic dermatitis 11/14/2017   Encounter for general adult medical examination with abnormal findings 08/07/2017   Cerebral palsy (HCC) 07/20/2016   Needs assistance while at home 06/11/2015    ONSET DATE: 09/08/2024 (Date of referral)  REFERRING DIAG: G80.1 (ICD-10-CM) - Spastic diplegic cerebral palsy (HCC) R25.2 (ICD-10-CM) - Spasticity Z74.09,Z78.9 (ICD-10-CM) - Impaired mobility and ADLs  THERAPY DIAG:  Muscle weakness (generalized)  Other abnormalities of gait and mobility  Cerebral palsy, unspecified type (HCC)  Abnormal posture  Right foot drop  Rationale for Evaluation and Treatment: Rehabilitation  SUBJECTIVE:  SUBJECTIVE STATEMENT:  Pt's grandmother reports she told pt's mom to call company regarding walker (d/t walker handles slide down) but not sure if she called.  Pt has been staying with grandma since Sunday d/t needing help to get to appointments.  Pt received botox  on Monday (takes 1-2 weeks to work).  Pt reports brace is too heavy (and also the shoes she wears with it) but otherwise R AFO is comfortable.  Pt's grandmother reports pt walks better with brace on.  Looking at getting Billy shoes (has zipper and goes over brace).  Pt has only worn R AFO 3-4 x's since getting it.  No recent falls reported.  Pt accompanied by: self and  grandma Stasia)  PERTINENT HISTORY: Pt is a 34 y.o. female with PMH of spastic diplegic CP and uses walker.  Per PT referral: Evaluate and treat. Seen prior; now with increased difficulty using walker, forgetting safety strategies / limb advancement with turns.  PMH includes: spastic diplegia CP, seizures, B feet sx, B hip and knee sx, R wrist sx (cannot bend at wrist).  PAIN:  Are you having pain? No  PRECAUTIONS: Fall  RED FLAGS: None   WEIGHT BEARING RESTRICTIONS: No  FALLS: Has patient fallen in last 6 months? No  LIVING ENVIRONMENT: Lives with: lives with their family (Mom and Dad) Lives in: House/apartment Bedroom main level Stairs: Yes: External: 1 plus 1 steps; none; 1 assist in/out of home (assist to lift walker) Has following equipment at home: Wheelchair (manual), Grab bars, and posterior walker ([employer])  PLOF: Uses walker in home; w/c used when going out.  Modified independent with walking, showering, dressing, cleaning room.  PATIENT GOALS: Pt states she wants to get stronger with legs and be able to bend legs better.  Pt's mom wants pt to be able to bend legs better to get in/out of car.  OBJECTIVE:  Note: Objective measures were completed at Evaluation unless otherwise noted.  COGNITION: Overall cognitive status: History of cognitive impairments - at baseline   SENSATION: Light touch: WFL  COORDINATION: Unable to assess d/t significant LE extensor tone  MUSCLE TONE: Significant B LE extensor tone (L>R)  POSTURE: Mild R lean; posterior trunk lean with B LE knees almost straight in extension in sitting (d/t increased LE tone)  LOWER EXTREMITY ROM:     Passive  Right Eval Left Eval  Hip flexion  90 degrees 75 degrees  Hip extension    Hip abduction    Hip adduction    Hip internal rotation    Hip external rotation    Knee flexion 85 degrees 70 degrees  Knee extension Neutral Neutral  Ankle dorsiflexion Positioned in PF Positioned in PF   Ankle plantarflexion    Ankle inversion    Ankle eversion     (Blank rows = not tested)  LOWER EXTREMITY MMT:  Difficult to assess d/t increased B LE tone  MMT Right Eval Left Eval  Hip flexion    Hip extension    Hip abduction    Hip adduction    Hip internal rotation    Hip external rotation    Knee flexion    Knee extension    Ankle dorsiflexion 2/5 2/5  Ankle plantarflexion    Ankle inversion    Ankle eversion    (Blank rows = not tested)  BED MOBILITY:  Modified independent (uses walker) at baseline per pt and pt's mom report  TRANSFERS: SBA with walker (increased effort and time to  perform on own with adaptive strategies; vc's required to improve technique)  GAIT: Findings: flexed trunk; B LE's appearing stiff (d/t tone); decreased foot clearance initially noted more on R but then was more pronounced on the L (pt intermittently catching feet on floor); B LE ER with wider BOS; ataxic gait; using posterior walker; ambulated clinic distances; SBA for safety  FUNCTIONAL TESTS:  10 meter walk test: 0.337 m/sec (29.66 seconds; used posterior walker)  PATIENT SURVEYS:  TBA                                                                                                                        TREATMENT DATE: 10/08/24  Therapeutic Exercise: ROM measurements (s/p botox  10/06/24): L hip flexion 78 degrees; L knee flexion 105 degrees R hip flexion 75 degrees: R knee flexion 105 degrees Supine hip flexion stretching: x30 seconds x2 sets B  Supine knee flexion stretching: x30 seconds x2 sets B Seated AROM knee extension followed by AROM to AAROM knee flexion x5 reps B LE's Issued HEP and performed caregiver training (to pt's grandmother) regarding HEP  Gait: Gait pattern: decreased step length- Right, decreased hip/knee flexion- Right, decreased ankle dorsiflexion- Right, circumduction- Right, trunk flexed, and poor foot clearance- Right Distance walked: various clinic  distances Assistive device utilized: posterior walker; R AFO Level of assistance: SBA and CGA Comments: pt tending to lean forward with trunk and rest her bottom on back part of walker during gait; brief improvements (unable to sustain) in gait mechanics noted with cueing for upright posture, bringing hips forwards, increasing R knee flexion during R LE advancement, and increasing R LE step length.   PATIENT EDUCATION: Education details: Pt's grandma to follow up with pt's mom to see if she called company regarding walker; wear R AFO to improve gait pattern and monitor skin for any skin breakdown or redness concerns.  HEP (with caregiver training). Person educated: Patient and Grandma Agricultural Engineer) Education method: Explanation, Demonstration, and Verbal cues Education comprehension: verbalized understanding and needs further education  HOME EXERCISE PROGRAM: 09/19/24 Access Code: [email or web address] URL: [email or web address] Date: 10/08/2024 Prepared by: Damien Caulk  Exercises - Supine Hip and Knee Flexion PROM with Caregiver  - 1 x daily - 7 x weekly - 1-2 sets - 5 reps - Seated Knee Flexion Extension AROM   - 1 x daily - 7 x weekly - 1-2 sets - 5 reps  GOALS: Goals reviewed with patient? Yes  SHORT TERM GOALS: Target date: 10/17/2024  Pt will be supervision with initial HEP in order to improve strength, ROM, and balance in order to decrease fall risk and improve function at home for ADL's. Baseline: TBA Goal status: INITIAL  2.  Assess B hip PROM flexion after pt receives Botox  and adjust long term goal PROM if needed (to improve transfers and getting in/out car). Baseline: 75 degrees L hip (Eval) Goal status: INITIAL  3.  Assess B knee flexion PROM after pt receives Botox  and adjust long term goal  PROM if needed (to improve transfers and getting in/out of car). Baseline: R knee 85 degrees and L knee 70 degrees (Eval) Goal status: INITIAL   LONG TERM GOALS: Target date:  10/31/2024  Pt will be supervision with final HEP in order to improve strength, ROM, and balance in order to decrease fall risk and improve function at home for ADL's. Baseline: TBA Goal status: INITIAL  2.  Pt will increase by at least 0.13 m/s in order to demonstrate clinically significant improvement in community ambulation. Baseline: 0.337 m/sec (Eval) Goal status: INITIAL  3.  Pt will increase B hip PROM flexion to at least 100 degrees to improve transfers and getting in/out car. Baseline: 75 degrees L; 90 degrees R (Eval) Goal status: INITIAL  4.  Pt will increase B knee flexion PROM to 100 degrees to improve transfers and getting in/out of car. Baseline: R knee 85 degrees and L knee 70 degrees (Eval) Goal status: INITIAL  5.  Pt will demonstrate improved safety strategies and LE limb advancement with turns using walker. Baseline: SBA and vc's required to improve technique Goal status: INITIAL   ASSESSMENT:  CLINICAL IMPRESSION: Patient was seen today for physical therapy treatment to address LE ROM and strength.  Focused session on measuring hip ROM s/p botox  (although pt's grandmother reports it takes 1-2 weeks to work), improving hip and knee ROM via stretching, and issuing HEP.  Pt's grandmother told pt's mother about needing to call company regarding walker concerns (not sure if pt's mother called; plan for pt's grandmother to follow-up with pt's mom).  Pt wearing R AFO during session today; pt reports it is comfortable in general but doesn't like to wear it d/t feeling heavy.  Checked pt's R LE skin (under AFO) during session; very small area of possible mild redness noted ant/sup medial malleoli but did not appear related to brace; pt and pt's grandmother educated to monitor pt's skin for any redness concerns.  Pt appears to walk better with R AFO on (pt's grandmother also reporting improved gait with use of AFO).  They would continue to benefit from skilled PT to  address impairments as noted and progress towards long term goals.  OBJECTIVE IMPAIRMENTS: Abnormal gait, decreased balance, decreased endurance, decreased mobility, difficulty walking, decreased ROM, decreased strength, impaired flexibility, impaired tone, improper body mechanics, and postural dysfunction.   ACTIVITY LIMITATIONS: carrying, lifting, bending, sitting, standing, squatting, stairs, transfers, bed mobility, bathing, toileting, dressing, and locomotion level  PARTICIPATION LIMITATIONS: meal prep, cleaning, laundry, driving, shopping, community activity, occupation, and yard work  PERSONAL FACTORS: Past/current experiences, Time since onset of injury/illness/exacerbation, and 3+ comorbidities: spastic diplegia CP, seizures, B feet sx, B hip and knee sx, R wrist sx are also affecting patient's functional outcome.   REHAB POTENTIAL: Fair d/t spasticity and time since onset of injury  CLINICAL DECISION MAKING: Evolving/moderate complexity  EVALUATION COMPLEXITY: Moderate  PLAN:  PT FREQUENCY: 1-2x/week (1x/week for 2 weeks then 2x/week for 4 weeks after receiving botox )  PT DURATION: 6 weeks  PLANNED INTERVENTIONS: 02835- PT Re-evaluation, 97750- Physical Performance Testing, 97110-Therapeutic exercises, 97530- Therapeutic activity, [id number]- Neuromuscular re-education, 97535- Self Care, 02859- Manual therapy, [id number]- Gait training, [phone]- Orthotic Initial, [phone]- Orthotic/Prosthetic subsequent, [phone]- Aquatic Therapy, [phone]- Splinting, [phone]- Electrical stimulation (manual), [phone]- Ultrasound, Patient/Family education, Balance training, Stair training, Taping, Joint mobilization, Spinal mobilization, Cognitive remediation, DME instructions, Wheelchair mobility training, Cryotherapy, Moist heat, and Biofeedback  PLAN FOR NEXT SESSION: Options to fix walker-did her family try tightening screws  or reaching out to [employer] again?; LE stretching/ROM; functional strengthening; balance; gait  training (improve foot clearance); work on turning technique with walker, family to bring R AFO to a future session, look at R knee flexion and hip flexion to address extensor tone; // bars activity to improve foot clearance  Damien Caulk, PT 10/08/2024, 9:11 PM

## 2024-10-13 ENCOUNTER — Ambulatory Visit: Admitting: Internal Medicine

## 2024-10-13 ENCOUNTER — Telehealth: Payer: Self-pay

## 2024-10-13 ENCOUNTER — Encounter: Payer: Self-pay | Admitting: Internal Medicine

## 2024-10-13 VITALS — BP 130/76 | HR 89 | Temp 98.6°F | Ht 59.0 in

## 2024-10-13 DIAGNOSIS — E876 Hypokalemia: Secondary | ICD-10-CM | POA: Diagnosis not present

## 2024-10-13 DIAGNOSIS — K529 Noninfective gastroenteritis and colitis, unspecified: Secondary | ICD-10-CM

## 2024-10-13 DIAGNOSIS — G800 Spastic quadriplegic cerebral palsy: Secondary | ICD-10-CM | POA: Diagnosis not present

## 2024-10-13 DIAGNOSIS — Z0000 Encounter for general adult medical examination without abnormal findings: Secondary | ICD-10-CM

## 2024-10-13 DIAGNOSIS — K638219 Small intestinal bacterial overgrowth, unspecified: Secondary | ICD-10-CM

## 2024-10-13 DIAGNOSIS — Z23 Encounter for immunization: Secondary | ICD-10-CM | POA: Diagnosis not present

## 2024-10-13 DIAGNOSIS — Z0001 Encounter for general adult medical examination with abnormal findings: Secondary | ICD-10-CM

## 2024-10-13 LAB — CBC WITH DIFFERENTIAL/PLATELET
Basophils Absolute: 0 K/uL (ref 0.0–0.1)
Basophils Relative: 0.3 % (ref 0.0–3.0)
Eosinophils Absolute: 0.3 K/uL (ref 0.0–0.7)
Eosinophils Relative: 4.6 % (ref 0.0–5.0)
HCT: 37 % (ref 36.0–46.0)
Hemoglobin: 12.1 g/dL (ref 12.0–15.0)
Lymphocytes Relative: 36.5 % (ref 12.0–46.0)
Lymphs Abs: 2 K/uL (ref 0.7–4.0)
MCHC: 32.7 g/dL (ref 30.0–36.0)
MCV: 84.3 fl (ref 78.0–100.0)
Monocytes Absolute: 0.7 K/uL (ref 0.1–1.0)
Monocytes Relative: 11.8 % (ref 3.0–12.0)
Neutro Abs: 2.6 K/uL (ref 1.4–7.7)
Neutrophils Relative %: 46.8 % (ref 43.0–77.0)
Platelets: 280 K/uL (ref 150.0–400.0)
RBC: 4.39 Mil/uL (ref 3.87–5.11)
RDW: 12.7 % (ref 11.5–15.5)
WBC: 5.6 K/uL (ref 4.0–10.5)

## 2024-10-13 LAB — BASIC METABOLIC PANEL WITH GFR
BUN: 7 mg/dL (ref 6–23)
CO2: 24 meq/L (ref 19–32)
Calcium: 9 mg/dL (ref 8.4–10.5)
Chloride: 105 meq/L (ref 96–112)
Creatinine, Ser: 0.51 mg/dL (ref 0.40–1.20)
GFR: 121.78 mL/min (ref >60.00)
Glucose, Bld: 79 mg/dL (ref 70–99)
Potassium: 3.3 meq/L — ABNORMAL LOW (ref 3.5–5.1)
Sodium: 139 meq/L (ref 135–145)

## 2024-10-13 LAB — VITAMIN B12: Vitamin B-12: 236 pg/mL (ref 211–911)

## 2024-10-13 LAB — FOLATE: Folate: 18.7 ng/mL (ref >5.9)

## 2024-10-13 LAB — TSH: TSH: 2.24 u[IU]/mL (ref 0.35–5.50)

## 2024-10-13 LAB — C-REACTIVE PROTEIN: CRP: <0.5 mg/dL (ref 0.5–20.0)

## 2024-10-13 NOTE — Telephone Encounter (Signed)
 I will send in appeal for the botox  decision. Can you type a letter of medical necessity? I will send the last to notes along with the letter.

## 2024-10-13 NOTE — Patient Instructions (Signed)
 Chronic Diarrhea Chronic diarrhea is when a person passes frequent loose and sometimes watery stools for 4 weeks or longer. Non-chronic diarrhea usually lasts for only 2-3 days. Diarrhea can cause a person to feel weak and become dehydrated. Dehydration is a condition in which there is not enough water or other fluids in the body. Dehydration can make the person tired and thirsty. It can also cause a dry mouth, decreased urination, and dark yellow urine. Diarrhea is a sign of an underlying problem, such as: Infection. Side effects of medicines. Problems digesting something in your diet, such as milk products if you have lactose intolerance. Conditions such as celiac disease, irritable bowel syndrome (IBS), or inflammatory bowel disease (IBD). If you have chronic diarrhea, make sure you treat it as told by your health care provider. Follow these instructions at home: Medicines Take over-the-counter and prescription medicines only as told by your health care provider. If you were prescribed antibiotics, take them as told by your health care provider. Do not stop taking the antibiotic even if you start to feel better. Eating and drinking  Follow instructions from your health care provider about what to eat and drink. You may have to: Avoid foods that trigger diarrhea for you. Take an oral rehydration solution (ORS). This is a drink that keeps you hydrated. It can be found at pharmacies and retail stores. Drink clear fluids, such as water, diluted fruit juice, and low-calorie sports drinks. You can also get fluids by sucking on ice chips. Drink enough fluid to keep your urine pale yellow. This will help you avoid dehydration. Eat small amounts of bland foods that are easy to digest as you are able. These foods include bananas, applesauce, rice, lean meats, toast, and crackers. Avoid spicy or fatty foods. Avoid foods and drinks that contain a lot of sugar or caffeine. Do not drink alcohol if: Your  health care provider tells you not to drink. You are pregnant, may be pregnant, or are planning to become pregnant. If you drink alcohol: Limit how much you have to: 0-1 drink a day for women. 0-2 drinks a day for men. Know how much alcohol is in your drink. In the [place] one drink equals one 12 oz bottle of beer (355 mL), one 5 oz glass of wine (148 mL), or one 1 oz glass of hard liquor (44 mL). General instructions  Wash your hands often and after each diarrhea episode for at least 20 seconds. Use soap and water. If soap and water are not available, use hand sanitizer. Make sure that all people in your household wash their hands well and often. Rest as told by your health care provider. Take a warm bath to relieve any burning or pain from frequent diarrhea episodes. Watch your condition for any changes. Contact a health care provider if: You have a fever. Your diarrhea gets worse or does not get better. You have new symptoms. You vomit every time you eat or drink. You feel light-headed or dizzy. You have muscle cramps. You have severe pain in the rectum. You have signs of dehydration, such as: Dark urine, very little urine, or no urine. Cracked lips. Dry mouth. Sunken eyes. Sleepiness. Weakness. You have bloody or black stools, or stools that look like tar. You have severe pain, cramping, or bloating in your abdomen, or pain that stays in one place. Your skin feels cold and clammy. You feel confused. You have a severe headache. Get help right away if: You have chest pain  or your heart is beating very quickly. You have trouble breathing or you are breathing very quickly. You feel extremely weak or you faint. These symptoms may be an emergency. Get help right away. Call 911. Do not wait to see if the symptoms will go away. Do not drive yourself to the hospital. This information is not intended to replace advice given to you by your health care provider. Make sure you discuss  any questions you have with your health care provider. Document Revised: 05/09/2022 Document Reviewed: 05/09/2022 Elsevier Patient Education  2024 [employer].

## 2024-10-13 NOTE — Progress Notes (Signed)
 "  Subjective:  Patient ID: Kerri Harris, female    DOB: 31-Dec-1990  Age: 33 y.o. MRN: [id number]  CC: Annual Exam (Patient has had diarrhea for the past 2 days.  )   HPI Kerri Harris presents for a CPX and f/up --  Discussed the use of AI scribe software for clinical note transcription with the patient, who gave verbal consent to proceed.  History of Present Illness Kerri Harris is a 33 year old female who presents with diarrhea. She is accompanied by her grandmother, Hessie.  She has been experiencing diarrhea for the past two days, with frequent trips to the bathroom and loose stools. No nausea, vomiting, abdominal pain, or blood in the stool. No fever or chills.  Her grandmother mentions that she has episodes of diarrhea approximately every two weeks, sometimes severe enough that she cannot hold it. She has not taken any medication for diarrhea except for Pepto Bismol, which did not alleviate her symptoms. She has not tried Imodium.  She uses a brace to assist with walking due to difficulty using her walker, and her grandmother ensures she uses it regularly.   Outpatient Medications Prior to Visit  Medication Sig Dispense Refill   Emollient (COCOA BUTTER EX) Apply topically. Prn to eczema     psyllium (METAMUCIL) 58.6 % powder Take 1 packet by mouth every evening.     tiZANidine  (ZANAFLEX ) 4 MG tablet Take 0.5-1 tablets (2-4 mg total) by mouth at bedtime as needed for muscle spasms. 30 tablet 5   baclofen  15 MG TABS Take 15 mg by mouth 3 (three) times daily. (Patient not taking: Reported on 09/01/2024) 90 tablet 11   No facility-administered medications prior to visit.    ROS Review of Systems  Constitutional: Negative.  Negative for appetite change, chills, diaphoresis, fatigue and fever.  HENT: Negative.  Negative for sore throat and trouble swallowing.   Eyes: Negative.   Respiratory:  Negative for cough, chest tightness, shortness of breath and wheezing.    Cardiovascular:  Negative for chest pain, palpitations and leg swelling.  Gastrointestinal:  Positive for diarrhea. Negative for abdominal pain, blood in stool, constipation, nausea and vomiting.  Endocrine: Negative.   Genitourinary: Negative.  Negative for difficulty urinating.  Musculoskeletal:  Positive for gait problem.  Skin: Negative.   Neurological:  Positive for weakness and numbness. Negative for dizziness and light-headedness.  Hematological:  Negative for adenopathy. Does not bruise/bleed easily.  Psychiatric/Behavioral: Negative.      Objective:  BP 130/76 (BP Location: Left Arm, Patient Position: Sitting, Cuff Size: Normal)   Pulse 89   Temp 98.6 F (37 C) (Oral)   Ht 4' 11 (1.499 m)   LMP 02/15/2021   SpO2 98%   BMI 24.04 kg/m   BP Readings from Last 3 Encounters:  10/15/24 126/69  10/13/24 130/76  10/06/24 122/69    Wt Readings from Last 3 Encounters:  10/06/24 119 lb (54 kg)  09/01/24 119 lb (54 kg)  06/02/24 118 lb (53.5 kg)    Physical Exam Vitals reviewed.  HENT:     Nose: Nose normal.     Mouth/Throat:     Mouth: Mucous membranes are moist.  Eyes:     General: No scleral icterus.    Conjunctiva/sclera: Conjunctivae normal.  Cardiovascular:     Rate and Rhythm: Normal rate and regular rhythm.     Heart sounds: No murmur heard.    No friction rub. No gallop.  Pulmonary:     Effort: Pulmonary  effort is normal.     Breath sounds: No stridor. No wheezing, rhonchi or rales.  Abdominal:     General: Abdomen is scaphoid. Bowel sounds are normal. There is no distension.     Palpations: Abdomen is soft. There is no hepatomegaly, splenomegaly or mass.     Tenderness: There is no abdominal tenderness.  Musculoskeletal:        General: Normal range of motion.     Cervical back: Neck supple.     Right lower leg: No edema.     Left lower leg: No edema.  Lymphadenopathy:     Cervical: No cervical adenopathy.  Skin:    General: Skin is warm and  dry.  Neurological:     Mental Status: She is alert. Mental status is at baseline.  Psychiatric:        Mood and Affect: Mood normal.        Behavior: Behavior normal.     Lab Results  Component Value Date   WBC 5.6 10/13/2024   HGB 12.1 10/13/2024   HCT 37.0 10/13/2024   PLT 280.0 10/13/2024   GLUCOSE 79 10/13/2024   CHOL 139 08/07/2017   TRIG 148.0 08/07/2017   HDL 49.00 08/07/2017   LDLCALC 61 08/07/2017   ALT 12 06/02/2024   AST 15 06/02/2024   NA 139 10/13/2024   K 3.3 (L) 10/13/2024   CL 105 10/13/2024   CREATININE 0.51 10/13/2024   BUN 7 10/13/2024   CO2 24 10/13/2024   TSH 2.24 10/13/2024   HGBA1C 5.3 11/14/2017    No results found.  Assessment & Plan:   Chronic diarrhea- Will treat for SIBO. -     Pancreatic elastase, fecal; Future -     Clostridium difficile EIA; Future -     Fecal lactoferrin, quant; Future -     Basic metabolic panel with GFR; Future -     Vitamin B12; Future -     CBC with Differential/Platelet; Future -     Folate; Future -     C-reactive protein; Future -     TSH; Future -     Tissue Transglutaminase Abs,IgG,IgA; Future  Need for immunization against influenza -     Flu vaccine trivalent PF, 6mos and older(Flulaval,Afluria,Fluarix,Fluzone)  Hypokalemia due to loss of potassium -     Potassium Chloride  ER; Take 1 tablet (10 mEq total) by mouth 2 (two) times daily.  Dispense: 180 tablet; Refill: 0  Encounter for general adult medical examination with abnormal findings- Exam completed, labs reviewed, vaccines reviewed and updated, no cancer screenings indicated, pt ed material was given.  -     COVID-19 mRNA Vac-TriS(Pfizer); Inject 0.3 mLs into the muscle once for 1 dose.  Dispense: 0.3 mL; Refill: 0  Spastic quadriplegic cerebral palsy (HCC)- Stable.  Small intestinal bacterial overgrowth (SIBO) -     rifAXIMin ; Take 1 tablet (550 mg total) by mouth 3 (three) times daily for 14 days.  Dispense: 42 tablet; Refill:  1     Follow-up: Return in about 3 months (around 01/13/2025).  Debby Molt, MD "

## 2024-10-14 ENCOUNTER — Encounter: Payer: Self-pay | Admitting: Physical Medicine and Rehabilitation

## 2024-10-14 DIAGNOSIS — E876 Hypokalemia: Secondary | ICD-10-CM | POA: Insufficient documentation

## 2024-10-14 DIAGNOSIS — Z23 Encounter for immunization: Secondary | ICD-10-CM | POA: Insufficient documentation

## 2024-10-14 LAB — TISSUE TRANSGLUTAMINASE ABS,IGG,IGA
(tTG) Ab, IgA: <1 U/mL
(tTG) Ab, IgG: <1 U/mL

## 2024-10-14 MED ORDER — POTASSIUM CHLORIDE ER 10 MEQ PO TBCR: 10.0000 meq | EXTENDED_RELEASE_TABLET | Freq: Two times a day (BID) | ORAL | 0 refills | Status: AC

## 2024-10-14 MED ORDER — COVID-19 MRNA VAC-TRIS(PFIZER) 30 MCG/0.3ML IM SUSY: 0.3000 mL | PREFILLED_SYRINGE | Freq: Once | INTRAMUSCULAR | 0 refills | Status: AC

## 2024-10-14 NOTE — Telephone Encounter (Signed)
 Appeal letter sent to your inbox; thank you!

## 2024-10-15 ENCOUNTER — Encounter: Payer: Self-pay | Admitting: Physical Therapy

## 2024-10-15 ENCOUNTER — Ambulatory Visit: Admitting: Physical Therapy

## 2024-10-15 VITALS — BP 126/69 | HR 105

## 2024-10-15 DIAGNOSIS — R293 Abnormal posture: Secondary | ICD-10-CM

## 2024-10-15 DIAGNOSIS — R2689 Other abnormalities of gait and mobility: Secondary | ICD-10-CM

## 2024-10-15 DIAGNOSIS — M6281 Muscle weakness (generalized): Secondary | ICD-10-CM | POA: Diagnosis not present

## 2024-10-15 DIAGNOSIS — G809 Cerebral palsy, unspecified: Secondary | ICD-10-CM

## 2024-10-15 DIAGNOSIS — M21371 Foot drop, right foot: Secondary | ICD-10-CM

## 2024-10-15 DIAGNOSIS — K638219 Small intestinal bacterial overgrowth, unspecified: Secondary | ICD-10-CM | POA: Insufficient documentation

## 2024-10-15 MED ORDER — RIFAXIMIN 550 MG PO TABS: 550.0000 mg | ORAL_TABLET | Freq: Three times a day (TID) | ORAL | 1 refills | Status: AC

## 2024-10-15 NOTE — Therapy (Signed)
 OUTPATIENT PHYSICAL THERAPY NEURO TREATMENT   Patient Name: Kerri Harris MRN: [id number] DOB:1991-06-21, 33 y.o., female Today's Date: 10/15/2024   PCP: Joshua Debby CROME, MD REFERRING PROVIDER: Emeline Joesph BROCKS, DO  END OF SESSION:  PT End of Session - 10/15/24 1235     Visit Number 4    Number of Visits 11   10 visits plus Eval   Date for Recertification  11/14/24   For scheduling delays   Authorization Type [employer]    Authorization Time Period 09/19/24-12/18/24    Authorization - Visit Number 4    Authorization - Number of Visits 12    PT Start Time 1231    PT Stop Time 1330    PT Time Calculation (min) 59 min    Equipment Utilized During Treatment Gait belt   posterior walker   Activity Tolerance Patient limited by fatigue    Behavior During Therapy --   Flat affect intermittently          Past Medical History:  Diagnosis Date   Cerebral palsy (HCC)    legs stiff do not bend   Eczema    comes and goes per mother  on neck at times uses cocoa butter prn right neck drying out now per mother   Seizures (HCC)    Childhood seizures - not since age 64   Uses walker    in home for ambulation, uses wheelchair for distance   Past Surgical History:  Procedure Laterality Date   feet surgery Bilateral    HEMORRHOID SURGERY N/A 01/22/2018   Procedure: HEMORRHOIDECTOMY SINGLE COLUMN;  Surgeon: Debby Hila, MD;  Location: [facility];  Service: General;  Laterality: N/A;   HIP SURGERY Bilateral yrs ago   KNEE SURGERY Bilateral    REMOVAL OF DRUG DELIVERY IMPLANT Left 03/08/2021   Procedure: REMOVAL OF DRUG DELIVERY IMPLANT (NEXPLANON ) FROM  LEFT UPPER ARM;  Surgeon: Corene Coy, MD;  Location:  [facility];  Service: Gynecology;  Laterality: Left;   right wrist surgery     cannot bend at wrist   ROBOTIC ASSISTED TOTAL HYSTERECTOMY Bilateral 03/08/2021   Procedure: XI ROBOTIC ASSISTED TOTAL HYSTERECTOMY WITH SALPINGECTOMY, CYSTOSCOPY;  Surgeon:  Corene Coy, MD;  Location: [facility] ;  Service: Gynecology;  Laterality: Bilateral;   Patient Active Problem List   Diagnosis Date Noted   Small intestinal bacterial overgrowth (SIBO) 10/15/2024   Need for immunization against influenza 10/14/2024   Hypokalemia due to loss of potassium 10/14/2024   Chronic diarrhea 10/13/2024   Muscle weakness (generalized) 06/20/2023   Other abnormalities of gait and mobility 06/20/2023   Right foot drop 06/20/2023   Spasticity 03/06/2023   Impaired mobility and ADLs 03/06/2023   Rocker bottom foot deformity 09/08/2019   Foot pain, bilateral 09/02/2019   Atopic dermatitis 11/14/2017   Encounter for general adult medical examination with abnormal findings 08/07/2017   Cerebral palsy (HCC) 07/20/2016   Needs assistance while at home 06/11/2015    ONSET DATE: 09/08/2024 (Date of referral)  REFERRING DIAG: G80.1 (ICD-10-CM) - Spastic diplegic cerebral palsy (HCC) R25.2 (ICD-10-CM) - Spasticity Z74.09,Z78.9 (ICD-10-CM) - Impaired mobility and ADLs  THERAPY DIAG:  Muscle weakness (generalized)  Other abnormalities of gait and mobility  Cerebral palsy, unspecified type (HCC)  Abnormal posture  Right foot drop  Rationale for Evaluation and Treatment: Rehabilitation  SUBJECTIVE:  SUBJECTIVE STATEMENT:  Pt's grandmother reports pt having soft stools since Saturday; went to MD on 10/13/24; now taking K+ medication.  Pt and pt's grandmother report no other changes.  Pt reports she feels good and able to participate in therapy.  Pt's grandmother reports pt's mother has not called company regarding walker.  Pt reports wearing R AFO is heavy when she puts shoe on it.  Been checking skin (under AFO) and looks good.  No recent falls.  Didn't  start HEP yet because her parents have been busy.   Pt accompanied by: self and grandma Stasia)  PERTINENT HISTORY: Pt is a 33 y.o. female with PMH of spastic diplegic CP and uses walker.  Per PT referral: Evaluate and treat. Seen prior; now with increased difficulty using walker, forgetting safety strategies / limb advancement with turns.  PMH includes: spastic diplegia CP, seizures, B feet sx, B hip and knee sx, R wrist sx (cannot bend at wrist).  PAIN:  Are you having pain? No  PRECAUTIONS: Fall  RED FLAGS: None   WEIGHT BEARING RESTRICTIONS: No  FALLS: Has patient fallen in last 6 months? No  LIVING ENVIRONMENT: Lives with: lives with their family (Mom and Dad) Lives in: House/apartment Bedroom main level Stairs: Yes: External: 1 plus 1 steps; none; 1 assist in/out of home (assist to lift walker) Has following equipment at home: Wheelchair (manual), Grab bars, and posterior walker ([employer])  PLOF: Uses walker in home; w/c used when going out.  Modified independent with walking, showering, dressing, cleaning room.  PATIENT GOALS: Pt states she wants to get stronger with legs and be able to bend legs better.  Pt's mom wants pt to be able to bend legs better to get in/out of car.  OBJECTIVE:  Note: Objective measures were completed at Evaluation unless otherwise noted.  COGNITION: Overall cognitive status: History of cognitive impairments - at baseline   SENSATION: Light touch: WFL  COORDINATION: Unable to assess d/t significant LE extensor tone  MUSCLE TONE: Significant B LE extensor tone (L>R)  POSTURE: Mild R lean; posterior trunk lean with B LE knees almost straight in extension in sitting (d/t increased LE tone)  LOWER EXTREMITY ROM:     Passive  Right Eval Left Eval  Hip flexion  90 degrees 75 degrees  Hip extension    Hip abduction    Hip adduction    Hip internal rotation    Hip external rotation    Knee flexion 85 degrees 70 degrees  Knee  extension Neutral Neutral  Ankle dorsiflexion Positioned in PF Positioned in PF  Ankle plantarflexion    Ankle inversion    Ankle eversion     (Blank rows = not tested)  LOWER EXTREMITY MMT:  Difficult to assess d/t increased B LE tone  MMT Right Eval Left Eval  Hip flexion    Hip extension    Hip abduction    Hip adduction    Hip internal rotation    Hip external rotation    Knee flexion    Knee extension    Ankle dorsiflexion 2/5 2/5  Ankle plantarflexion    Ankle inversion    Ankle eversion    (Blank rows = not tested)  BED MOBILITY:  Modified independent (uses walker) at baseline per pt and pt's mom report  TRANSFERS: SBA with walker (increased effort and time to perform on own with adaptive strategies; vc's required to improve technique)  GAIT: Findings: flexed trunk; B LE's appearing stiff (  d/t tone); decreased foot clearance initially noted more on R but then was more pronounced on the L (pt intermittently catching feet on floor); B LE ER with wider BOS; ataxic gait; using posterior walker; ambulated clinic distances; SBA for safety  FUNCTIONAL TESTS:  10 meter walk test: 0.337 m/sec (29.66 seconds; used posterior walker)  PATIENT SURVEYS:  TBA                                                                                                                        TREATMENT DATE: 10/15/24  Self Care: BP and HR taken in sitting at rest beginning of session (see below for details). Vitals:   10/15/24 1250  BP: 126/69  Pulse: (!) 105  End of session BP 140/68 with HR 91 bpm Pt Education (see below for details)  Therapeutic Exercise: Sitting knee extension/flexion AROM to AAROM: x5 reps B LE's; reviewed HEP so pt able to perform some on own; initial vc's for technique Supine B hip and knee flexion PROM/stretching to improve flexibility: x20 reps B LE's  Therapeutic Activity: Standing in // bars with B UE support: Step forward and then back (trialed stepping  over bar on ground but after 2 reps switched to stepping over ruler that was flat on ground): x10 reps R LE; x10 reps L LE.  Notes: R LE more difficult than L LE (decreased foot clearance); vc's and tactile cues for posture, LE technique, and positioning. Ambulation in // bars: x8 feet x2 trials.  B UE support on // bars.  Notes: vc's for upright midline posture; visual feedback on 2nd trial via mirror. Standing posture in // bars: x1 minute x2 sets (visual feedback via mirror; vc's and tactile feed back--pt tending to lean forward with hips to L and shoulders to R; able to correct with cueing but unable to maintain without consistent cueing; B UE support).  Gait: Gait pattern: decreased step length- Right, decreased hip/knee flexion- Right, decreased ankle dorsiflexion- Right, circumduction- Right, trunk flexed, and poor foot clearance- Right Distance walked: various clinic distances Assistive device utilized: posterior walker; R AFO Level of assistance: CGA Comments: pt tending to lean forward with trunk and rest her bottom on back part of walker during gait; brief improvements (unable to sustain) in gait mechanics noted with cueing for upright posture, bringing hips forwards, increasing R knee flexion during R LE advancement, and increasing R LE step length.   PATIENT EDUCATION: Education details: Pt's grandmother plans to try to tighten screws to help fix walker.  Continue to monitor skin with AFO use.  Reviewed sitting LE HEP that pt able to perform on own.  Stay hydrated (pt's grandmother reports pt only drinks liquids with meals).  Educated to obtain medical attention if medical concerns noted.  Person educated: Patient and Grandma Agricultural Engineer) Education method: Explanation, Demonstration, and Verbal cues Education comprehension: verbalized understanding and needs further education  HOME EXERCISE PROGRAM: 09/19/24 Access Code: [email or web address] URL: [email or web address] Date:  10/08/2024 Prepared by: Damien  Alando Colleran  Exercises - Supine Hip and Knee Flexion PROM with Caregiver  - 1 x daily - 7 x weekly - 1-2 sets - 5 reps - Seated Knee Flexion Extension AROM   - 1 x daily - 7 x weekly - 1-2 sets - 5 reps  GOALS: Goals reviewed with patient? Yes  SHORT TERM GOALS: Target date: 10/17/2024  Pt will be supervision with initial HEP in order to improve strength, ROM, and balance in order to decrease fall risk and improve function at home for ADL's. Baseline: TBA Goal status: INITIAL  2.  Assess B hip PROM flexion after pt receives Botox  and adjust long term goal PROM if needed (to improve transfers and getting in/out car). Baseline: 75 degrees L hip (Eval) Goal status: INITIAL  3.  Assess B knee flexion PROM after pt receives Botox  and adjust long term goal PROM if needed (to improve transfers and getting in/out of car). Baseline: R knee 85 degrees and L knee 70 degrees (Eval) Goal status: INITIAL   LONG TERM GOALS: Target date: 10/31/2024  Pt will be supervision with final HEP in order to improve strength, ROM, and balance in order to decrease fall risk and improve function at home for ADL's. Baseline: TBA Goal status: INITIAL  2.  Pt will increase by at least 0.13 m/s in order to demonstrate clinically significant improvement in community ambulation. Baseline: 0.337 m/sec (Eval) Goal status: INITIAL  3.  Pt will increase B hip PROM flexion to at least 100 degrees to improve transfers and getting in/out car. Baseline: 75 degrees L; 90 degrees R (Eval) Goal status: INITIAL  4.  Pt will increase B knee flexion PROM to 100 degrees to improve transfers and getting in/out of car. Baseline: R knee 85 degrees and L knee 70 degrees (Eval) Goal status: INITIAL  5.  Pt will demonstrate improved safety strategies and LE limb advancement with turns using walker. Baseline: SBA and vc's required to improve technique Goal status:  INITIAL   ASSESSMENT:  CLINICAL IMPRESSION: Patient was seen today for physical therapy treatment to address ROM, strength, posture, and ambulation.  Focused session on reviewing part of HEP, increasing hip and knee flexion ROM, increasing foot clearance and upright posture with ambulation, and improving standing posture.  Pt appearing tired at end of session (vitals checked: BP 140/68 with HR 91 bpm; pt given water d/t possible dehydration concerns).  In order to conserve energy, therapist provided pt with clinic w/c to transport pt from therapy gym to pt's grandmother's car.  Pt's grandmother reports having w/c that pt can use once home if needed.  Educated pt and pt's grandmother to seek medical attention if medical concerns noted.  They would continue to benefit from skilled PT to address impairments as noted and progress towards long term goals.  OBJECTIVE IMPAIRMENTS: Abnormal gait, decreased balance, decreased endurance, decreased mobility, difficulty walking, decreased ROM, decreased strength, impaired flexibility, impaired tone, improper body mechanics, and postural dysfunction.   ACTIVITY LIMITATIONS: carrying, lifting, bending, sitting, standing, squatting, stairs, transfers, bed mobility, bathing, toileting, dressing, and locomotion level  PARTICIPATION LIMITATIONS: meal prep, cleaning, laundry, driving, shopping, community activity, occupation, and yard work  PERSONAL FACTORS: Past/current experiences, Time since onset of injury/illness/exacerbation, and 3+ comorbidities: spastic diplegia CP, seizures, B feet sx, B hip and knee sx, R wrist sx are also affecting patient's functional outcome.   REHAB POTENTIAL: Fair d/t spasticity and time since onset of injury  CLINICAL DECISION MAKING: Evolving/moderate complexity  EVALUATION  COMPLEXITY: Moderate  PLAN:  PT FREQUENCY: 1-2x/week (1x/week for 2 weeks then 2x/week for 4 weeks after receiving botox )  PT DURATION: 6 weeks  PLANNED  INTERVENTIONS: 97164- PT Re-evaluation, 97750- Physical Performance Testing, 97110-Therapeutic exercises, 97530- Therapeutic activity, [id number]- Neuromuscular re-education, 97535- Self Care, [id number]- Manual therapy, [id number]- Gait training, [phone]- Orthotic Initial, [phone]- Orthotic/Prosthetic subsequent, [phone]- Aquatic Therapy, [phone]- Splinting, [phone]- Electrical stimulation (manual), [phone]- Ultrasound, Patient/Family education, Balance training, Stair training, Taping, Joint mobilization, Spinal mobilization, Cognitive remediation, DME instructions, Wheelchair mobility training, Cryotherapy, Moist heat, and Biofeedback  PLAN FOR NEXT SESSION: STG's due.  Options to fix walker-did her family try tightening screws or reaching out to [employer] again?; LE stretching/ROM; functional strengthening; balance; gait training (improve foot clearance); work on turning technique with walker, family to bring R AFO; look at R knee flexion and hip flexion to address extensor tone; // bars activity to improve foot clearance; posture; try RW?  Damien Caulk, PT 10/15/2024, 8:05 PM

## 2024-10-16 ENCOUNTER — Ambulatory Visit: Payer: Self-pay | Admitting: Internal Medicine

## 2024-10-17 ENCOUNTER — Ambulatory Visit: Admitting: Physical Therapy

## 2024-10-19 LAB — CLOSTRIDIUM DIFFICILE EIA: C difficile Toxins A+B, EIA: NEGATIVE

## 2024-10-22 ENCOUNTER — Ambulatory Visit: Admitting: Physical Therapy

## 2024-10-22 ENCOUNTER — Encounter: Payer: Self-pay | Admitting: Physical Therapy

## 2024-10-22 DIAGNOSIS — R2689 Other abnormalities of gait and mobility: Secondary | ICD-10-CM

## 2024-10-22 DIAGNOSIS — M6281 Muscle weakness (generalized): Secondary | ICD-10-CM

## 2024-10-22 DIAGNOSIS — M21371 Foot drop, right foot: Secondary | ICD-10-CM

## 2024-10-22 DIAGNOSIS — R293 Abnormal posture: Secondary | ICD-10-CM

## 2024-10-22 DIAGNOSIS — G809 Cerebral palsy, unspecified: Secondary | ICD-10-CM

## 2024-10-22 NOTE — Therapy (Signed)
 OUTPATIENT PHYSICAL THERAPY NEURO TREATMENT   Patient Name: Kerri Harris MRN: [id number] DOB:[birth year], 33 y.o., female Today's Date: 10/22/2024   PCP: Joshua Debby CROME, MD REFERRING PROVIDER: Emeline Joesph BROCKS, DO  END OF SESSION:  PT End of Session - 10/22/24 1233     Visit Number 5    Number of Visits 11   10 visits plus Eval   Date for Recertification  11/14/24   For scheduling delays   Authorization Type [employer]    Authorization Time Period 09/19/24-12/18/24    Authorization - Visit Number 5    Authorization - Number of Visits 12    PT Start Time 1229    PT Stop Time 1315    PT Time Calculation (min) 46 min    Equipment Utilized During Treatment Gait belt   posterior walker   Activity Tolerance Patient tolerated treatment well    Behavior During Therapy --   Flat affect intermittently          Past Medical History:  Diagnosis Date   Cerebral palsy (HCC)    legs stiff do not bend   Eczema    comes and goes per mother  on neck at times uses cocoa butter prn right neck drying out now per mother   Seizures (HCC)    Childhood seizures - not since age 89   Uses walker    in home for ambulation, uses wheelchair for distance   Past Surgical History:  Procedure Laterality Date   feet surgery Bilateral    HEMORRHOID SURGERY N/A 01/22/2018   Procedure: HEMORRHOIDECTOMY SINGLE COLUMN;  Surgeon: Debby Hila, MD;  Location: [facility];  Service: General;  Laterality: N/A;   HIP SURGERY Bilateral yrs ago   KNEE SURGERY Bilateral    REMOVAL OF DRUG DELIVERY IMPLANT Left 03/08/2021   Procedure: REMOVAL OF DRUG DELIVERY IMPLANT (NEXPLANON ) FROM  LEFT UPPER ARM;  Surgeon: Corene Coy, MD;  Location: [facility];  Service: Gynecology;  Laterality: Left;   right wrist surgery     cannot bend at wrist   ROBOTIC ASSISTED TOTAL HYSTERECTOMY Bilateral 03/08/2021   Procedure: XI ROBOTIC ASSISTED TOTAL HYSTERECTOMY WITH SALPINGECTOMY, CYSTOSCOPY;  Surgeon:  Corene Coy, MD;  Location: [facility];  Service: Gynecology;  Laterality: Bilateral;   Patient Active Problem List   Diagnosis Date Noted   Small intestinal bacterial overgrowth (SIBO) 10/15/2024   Need for immunization against influenza 10/14/2024   Hypokalemia due to loss of potassium 10/14/2024   Chronic diarrhea 10/13/2024   Muscle weakness (generalized) 06/20/2023   Other abnormalities of gait and mobility 06/20/2023   Right foot drop 06/20/2023   Spasticity 03/06/2023   Impaired mobility and ADLs 03/06/2023   Rocker bottom foot deformity 09/08/2019   Foot pain, bilateral 09/02/2019   Atopic dermatitis 11/14/2017   Encounter for general adult medical examination with abnormal findings 08/07/2017   Cerebral palsy (HCC) 07/20/2016   Needs assistance while at home 06/11/2015    ONSET DATE: 09/08/2024 (Date of referral)  REFERRING DIAG: G80.1 (ICD-10-CM) - Spastic diplegic cerebral palsy (HCC) R25.2 (ICD-10-CM) - Spasticity Z74.09,Z78.9 (ICD-10-CM) - Impaired mobility and ADLs  THERAPY DIAG:  Muscle weakness (generalized)  Other abnormalities of gait and mobility  Cerebral palsy, unspecified type (HCC)  Abnormal posture  Right foot drop  Rationale for Evaluation and Treatment: Rehabilitation  SUBJECTIVE:  SUBJECTIVE STATEMENT:   Pt's grandmother reports pt did ok after last therapy session; took pt to eat after and got pt to drink more fluids.  Feeling better in general.  No more diarrhea; negative stool sample.  Pt's mom did not call company regarding walker.  No recent falls.  Wearing R AFO (no skin concerns reported).  Still taking antibiotic.  Pt's grandmother tightened walker handle screws prior to coming to appointment. Pt accompanied by: self and grandma  Stasia)  PERTINENT HISTORY: Pt is a 33 y.o. female with PMH of spastic diplegic CP and uses walker.  Per PT referral: Evaluate and treat. Seen prior; now with increased difficulty using walker, forgetting safety strategies / limb advancement with turns.  PMH includes: spastic diplegia CP, seizures, B feet sx, B hip and knee sx, R wrist sx (cannot bend at wrist).  PAIN:  Are you having pain? No  PRECAUTIONS: Fall  RED FLAGS: None   WEIGHT BEARING RESTRICTIONS: No  FALLS: Has patient fallen in last 6 months? No  LIVING ENVIRONMENT: Lives with: lives with their family (Mom and Dad) Lives in: House/apartment Bedroom main level Stairs: Yes: External: 1 plus 1 steps; none; 1 assist in/out of home (assist to lift walker) Has following equipment at home: Wheelchair (manual), Grab bars, and posterior walker ([employer])  PLOF: Uses walker in home; w/c used when going out.  Modified independent with walking, showering, dressing, cleaning room.  PATIENT GOALS: Pt states she wants to get stronger with legs and be able to bend legs better.  Pt's mom wants pt to be able to bend legs better to get in/out of car.  OBJECTIVE:  Note: Objective measures were completed at Evaluation unless otherwise noted.  COGNITION: Overall cognitive status: History of cognitive impairments - at baseline   SENSATION: Light touch: WFL  COORDINATION: Unable to assess d/t significant LE extensor tone  MUSCLE TONE: Significant B LE extensor tone (L>R)  POSTURE: Mild R lean; posterior trunk lean with B LE knees almost straight in extension in sitting (d/t increased LE tone)  LOWER EXTREMITY ROM:     Passive  Right Eval Left Eval  Hip flexion  90 degrees 75 degrees  Hip extension    Hip abduction    Hip adduction    Hip internal rotation    Hip external rotation    Knee flexion 85 degrees 70 degrees  Knee extension Neutral Neutral  Ankle dorsiflexion Positioned in PF Positioned in PF  Ankle  plantarflexion    Ankle inversion    Ankle eversion     (Blank rows = not tested)  LOWER EXTREMITY MMT:  Difficult to assess d/t increased B LE tone  MMT Right Eval Left Eval  Hip flexion    Hip extension    Hip abduction    Hip adduction    Hip internal rotation    Hip external rotation    Knee flexion    Knee extension    Ankle dorsiflexion 2/5 2/5  Ankle plantarflexion    Ankle inversion    Ankle eversion    (Blank rows = not tested)  BED MOBILITY:  Modified independent (uses walker) at baseline per pt and pt's mom report  TRANSFERS: SBA with walker (increased effort and time to perform on own with adaptive strategies; vc's required to improve technique)  GAIT: Findings: flexed trunk; B LE's appearing stiff (d/t tone); decreased foot clearance initially noted more on R but then was more pronounced on the L (pt  intermittently catching feet on floor); B LE ER with wider BOS; ataxic gait; using posterior walker; ambulated clinic distances; SBA for safety  FUNCTIONAL TESTS:  10 meter walk test: 0.337 m/sec (29.66 seconds; used posterior walker)  PATIENT SURVEYS:  TBA                                                                                                                        TREATMENT DATE: 10/22/24  Therapeutic Exercise: B hip and knee ROM measurements in sitting for STG's (see STG's below for details) SciFit level 2 for 2 minutes using BUE/BLEs for neural priming for reciprocal movement, dynamic cardiovascular warmup and increased amplitude of stepping. Therapist assisting pt with keeping knees apart (increased B hip internal rotation and B hip aDduction noted during activity limiting activity). Sitting B hip aBduction/external rotation exercise with yellow theraband around distal thighs (for tactile feedback): x10 reps x2 sets with small AROM. Notes: therapist assisting with LE positioning (maintaining appropriate B knee flexion and keeping feet position on  floor)  Gait: Gait pattern: decreased step length- Right, decreased hip/knee flexion- Right, decreased ankle dorsiflexion- Right, circumduction- Right, trunk flexed, and poor foot clearance- Right Distance walked: various clinic distances Assistive device utilized: posterior walker; R AFO Level of assistance: CGA Comments: pt tending to lean forward with trunk and rest her bottom on back part of walker during gait; brief improvements (unable to sustain) in gait mechanics noted with cueing for upright posture, bringing hips forwards, increasing R knee flexion during R LE advancement, and increasing R LE step length.  Pt seemed to do mildly better when advancing R LE and not advancing walker at same time.   PATIENT EDUCATION: Education details: Continue HEP.  Continue to monitor skin with AFO use. Person educated: Patient and Grandma Agricultural Engineer) Education method: Explanation, Demonstration, and Verbal cues Education comprehension: verbalized understanding and needs further education  HOME EXERCISE PROGRAM: 09/19/24 Access Code: [email or web address] URL: [email or web address] Date: 10/08/2024 Prepared by: Damien Caulk  Exercises - Supine Hip and Knee Flexion PROM with Caregiver  - 1 x daily - 7 x weekly - 1-2 sets - 5 reps - Seated Knee Flexion Extension AROM   - 1 x daily - 7 x weekly - 1-2 sets - 5 reps  GOALS: Goals reviewed with patient? Yes  SHORT TERM GOALS: Target date: 10/17/2024  Pt will be supervision with initial HEP in order to improve strength, ROM, and balance in order to decrease fall risk and improve function at home for ADL's. Baseline: HEP issued Goal status: MET (pt's grandmother helping pt with ex's; pt downloaded app on phone so able to do some on own)  2.  Assess B hip PROM flexion after pt receives Botox  and adjust long term goal PROM if needed (to improve transfers and getting in/out car). Baseline: 75 degrees L hip and 90 degrees R hip (Eval); 112 degrees L  hip and 100 degrees R hip 10/22/24 Goal status: MET  3.  Assess B knee  flexion PROM after pt receives Botox  and adjust long term goal PROM if needed (to improve transfers and getting in/out of car). Baseline: R knee 85 degrees and L knee 70 degrees (Eval); L knee 100 degrees and 103 degrees R knee 10/22/24 Goal status: MET   LONG TERM GOALS: Target date: 10/31/2024  Pt will be supervision with final HEP in order to improve strength, ROM, and balance in order to decrease fall risk and improve function at home for ADL's. Baseline: TBA Goal status: INITIAL  2.  Pt will increase by at least 0.13 m/s in order to demonstrate clinically significant improvement in community ambulation. Baseline: 0.337 m/sec (Eval) Goal status: INITIAL  3.  Pt will increase B hip PROM flexion to at least 115 degrees to improve transfers and getting in/out car. Baseline: 75 degrees L and 90 degrees R (Eval); 112 degrees L hip and 100 degrees R hip 10/22/24 Goal status: INITIAL  4.  Pt will increase B knee flexion PROM to at least 110 degrees to improve transfers and getting in/out of car. Baseline: R knee 85 degrees and L knee 70 degrees (Eval); L knee 100 degrees and 103 degrees R knee 10/22/24 Goal status: INITIAL  5.  Pt will demonstrate improved safety strategies and LE limb advancement with turns using walker. Baseline: SBA and vc's required to improve technique Goal status: INITIAL   ASSESSMENT:  CLINICAL IMPRESSION: Patient was seen today for physical therapy treatment to address STG's and strengthening.  Focused session on addressing STG's and increasing LE strength.  Pt met 3/3 STG's and significant improvement in B hip and knee flexion ROM noted (see STG's above for ROM details).  Pt asking to use bike (Sci-Fit) during session d/t pt reporting previously using it with another therapist.  Increased time required to set up activity for positioning and limited to 2 minutes d/t increased B hip  internal rotation and aDduction with activity.  Patient continues to be limited by strength and LE spasticity.  They demonstrate significant improvement in B hip and knee ROM today compared to eval.  They would continue to benefit from skilled PT to address impairments as noted and progress towards long term goals.  OBJECTIVE IMPAIRMENTS: Abnormal gait, decreased balance, decreased endurance, decreased mobility, difficulty walking, decreased ROM, decreased strength, impaired flexibility, impaired tone, improper body mechanics, and postural dysfunction.   ACTIVITY LIMITATIONS: carrying, lifting, bending, sitting, standing, squatting, stairs, transfers, bed mobility, bathing, toileting, dressing, and locomotion level  PARTICIPATION LIMITATIONS: meal prep, cleaning, laundry, driving, shopping, community activity, occupation, and yard work  PERSONAL FACTORS: Past/current experiences, Time since onset of injury/illness/exacerbation, and 3+ comorbidities: spastic diplegia CP, seizures, B feet sx, B hip and knee sx, R wrist sx are also affecting patient's functional outcome.   REHAB POTENTIAL: Fair d/t spasticity and time since onset of injury  CLINICAL DECISION MAKING: Evolving/moderate complexity  EVALUATION COMPLEXITY: Moderate  PLAN:  PT FREQUENCY: 1-2x/week (1x/week for 2 weeks then 2x/week for 4 weeks after receiving botox )  PT DURATION: 6 weeks  PLANNED INTERVENTIONS: 02835- PT Re-evaluation, 97750- Physical Performance Testing, 97110-Therapeutic exercises, 97530- Therapeutic activity, [id number]- Neuromuscular re-education, 97535- Self Care, 02859- Manual therapy, [id number]- Gait training, [phone]- Orthotic Initial, [phone]- Orthotic/Prosthetic subsequent, [phone]- Aquatic Therapy, [phone]- Splinting, [phone]- Electrical stimulation (manual), [phone]- Ultrasound, Patient/Family education, Balance training, Stair training, Taping, Joint mobilization, Spinal mobilization, Cognitive remediation, DME instructions,  Wheelchair mobility training, Cryotherapy, Moist heat, and Biofeedback  PLAN FOR NEXT SESSION: Options to fix walker-did her family try reaching  out to [employer] again?/how did things go with screws tightened?; LE stretching/ROM; functional strengthening; balance; gait training (improve foot clearance); work on turning technique with walker, look at R knee flexion and hip flexion to address extensor tone; // bars activity to improve foot clearance; posture; try RW?  Damien Caulk, PT 10/22/2024, 7:48 PM

## 2024-10-24 ENCOUNTER — Encounter: Payer: Self-pay | Admitting: Physical Therapy

## 2024-10-24 ENCOUNTER — Ambulatory Visit: Admitting: Physical Therapy

## 2024-10-24 DIAGNOSIS — M21371 Foot drop, right foot: Secondary | ICD-10-CM

## 2024-10-24 DIAGNOSIS — M6281 Muscle weakness (generalized): Secondary | ICD-10-CM | POA: Diagnosis not present

## 2024-10-24 DIAGNOSIS — R2689 Other abnormalities of gait and mobility: Secondary | ICD-10-CM

## 2024-10-24 DIAGNOSIS — G809 Cerebral palsy, unspecified: Secondary | ICD-10-CM

## 2024-10-24 DIAGNOSIS — R293 Abnormal posture: Secondary | ICD-10-CM

## 2024-10-24 NOTE — Therapy (Signed)
 OUTPATIENT PHYSICAL THERAPY NEURO TREATMENT   Patient Name: Kerri Harris MRN: [id number] DOB:1991-03-31, 33 y.o., female Today's Date: 10/24/2024   PCP: Joshua Debby CROME, MD REFERRING PROVIDER: Emeline Joesph BROCKS, DO  END OF SESSION:  PT End of Session - 10/24/24 1322     Visit Number 6    Number of Visits 11   10 visits plus Eval   Date for Recertification  11/14/24   For scheduling delays   Authorization Type [employer]    Authorization Time Period 09/19/24-12/18/24    Authorization - Visit Number 6    Authorization - Number of Visits 12    PT Start Time 1317    PT Stop Time 1400    PT Time Calculation (min) 43 min    Equipment Utilized During Treatment Gait belt    Activity Tolerance Patient tolerated treatment well    Behavior During Therapy --   Flat affect intermittently          Past Medical History:  Diagnosis Date   Cerebral palsy (HCC)    legs stiff do not bend   Eczema    comes and goes per mother  on neck at times uses cocoa butter prn right neck drying out now per mother   Seizures (HCC)    Childhood seizures - not since age 63   Uses walker    in home for ambulation, uses wheelchair for distance   Past Surgical History:  Procedure Laterality Date   feet surgery Bilateral    HEMORRHOID SURGERY N/A 01/22/2018   Procedure: HEMORRHOIDECTOMY SINGLE COLUMN;  Surgeon: Debby Hila, MD;  Location: [facility];  Service: General;  Laterality: N/A;   HIP SURGERY Bilateral yrs ago   KNEE SURGERY Bilateral    REMOVAL OF DRUG DELIVERY IMPLANT Left 03/08/2021   Procedure: REMOVAL OF DRUG DELIVERY IMPLANT (NEXPLANON ) FROM  LEFT UPPER ARM;  Surgeon: Corene Coy, MD;  Location: [facility];  Service: Gynecology;  Laterality: Left;   right wrist surgery     cannot bend at wrist   ROBOTIC ASSISTED TOTAL HYSTERECTOMY Bilateral 03/08/2021   Procedure: XI ROBOTIC ASSISTED TOTAL HYSTERECTOMY WITH SALPINGECTOMY, CYSTOSCOPY;  Surgeon: Corene Coy,  MD;  Location: [facility];  Service: Gynecology;  Laterality: Bilateral;   Patient Active Problem List   Diagnosis Date Noted   Small intestinal bacterial overgrowth (SIBO) 10/15/2024   Need for immunization against influenza 10/14/2024   Hypokalemia due to loss of potassium 10/14/2024   Chronic diarrhea 10/13/2024   Muscle weakness (generalized) 06/20/2023   Other abnormalities of gait and mobility 06/20/2023   Right foot drop 06/20/2023   Spasticity 03/06/2023   Impaired mobility and ADLs 03/06/2023   Rocker bottom foot deformity 09/08/2019   Foot pain, bilateral 09/02/2019   Atopic dermatitis 11/14/2017   Encounter for general adult medical examination with abnormal findings 08/07/2017   Cerebral palsy (HCC) 07/20/2016   Needs assistance while at home 06/11/2015    ONSET DATE: 09/08/2024 (Date of referral)  REFERRING DIAG: G80.1 (ICD-10-CM) - Spastic diplegic cerebral palsy (HCC) R25.2 (ICD-10-CM) - Spasticity Z74.09,Z78.9 (ICD-10-CM) - Impaired mobility and ADLs  THERAPY DIAG:  Muscle weakness (generalized)  Other abnormalities of gait and mobility  Cerebral palsy, unspecified type (HCC)  Abnormal posture  Right foot drop  Rationale for Evaluation and Treatment: Rehabilitation  SUBJECTIVE:  SUBJECTIVE STATEMENT:   Almost fell getting out of shower with grandma (grandmother able to keep pt from falling).  [employer] coming to check out walker on Monday.  Pt's mother in urgent care so pt's grandmother brought pt today.  Pt wearing R AFO and ambulating with posterior walker.  Pt's grandmother reports tightening screws on walker did not help (handles still going down). Pt accompanied by: self and grandma Stasia)  PERTINENT HISTORY: Pt is a 33 y.o. female with PMH of  spastic diplegic CP and uses walker.  Per PT referral: Evaluate and treat. Seen prior; now with increased difficulty using walker, forgetting safety strategies / limb advancement with turns.  PMH includes: spastic diplegia CP, seizures, B feet sx, B hip and knee sx, R wrist sx (cannot bend at wrist).  PAIN:  Are you having pain? No  PRECAUTIONS: Fall  RED FLAGS: None   WEIGHT BEARING RESTRICTIONS: No  FALLS: Has patient fallen in last 6 months? No  LIVING ENVIRONMENT: Lives with: lives with their family (Mom and Dad) Lives in: House/apartment Bedroom main level Stairs: Yes: External: 1 plus 1 steps; none; 1 assist in/out of home (assist to lift walker) Has following equipment at home: Wheelchair (manual), Grab bars, and posterior walker ([employer])  PLOF: Uses walker in home; w/c used when going out.  Modified independent with walking, showering, dressing, cleaning room.  PATIENT GOALS: Pt states she wants to get stronger with legs and be able to bend legs better.  Pt's mom wants pt to be able to bend legs better to get in/out of car.  OBJECTIVE:  Note: Objective measures were completed at Evaluation unless otherwise noted.  COGNITION: Overall cognitive status: History of cognitive impairments - at baseline   SENSATION: Light touch: WFL  COORDINATION: Unable to assess d/t significant LE extensor tone  MUSCLE TONE: Significant B LE extensor tone (L>R)  POSTURE: Mild R lean; posterior trunk lean with B LE knees almost straight in extension in sitting (d/t increased LE tone)  LOWER EXTREMITY ROM:     Passive  Right Eval Left Eval  Hip flexion  90 degrees 75 degrees  Hip extension    Hip abduction    Hip adduction    Hip internal rotation    Hip external rotation    Knee flexion 85 degrees 70 degrees  Knee extension Neutral Neutral  Ankle dorsiflexion Positioned in PF Positioned in PF  Ankle plantarflexion    Ankle inversion    Ankle eversion     (Blank  rows = not tested)  LOWER EXTREMITY MMT:  Difficult to assess d/t increased B LE tone  MMT Right Eval Left Eval  Hip flexion    Hip extension    Hip abduction    Hip adduction    Hip internal rotation    Hip external rotation    Knee flexion    Knee extension    Ankle dorsiflexion 2/5 2/5  Ankle plantarflexion    Ankle inversion    Ankle eversion    (Blank rows = not tested)  BED MOBILITY:  Modified independent (uses walker) at baseline per pt and pt's mom report  TRANSFERS: SBA with walker (increased effort and time to perform on own with adaptive strategies; vc's required to improve technique)  GAIT: Findings: flexed trunk; B LE's appearing stiff (d/t tone); decreased foot clearance initially noted more on R but then was more pronounced on the L (pt intermittently catching feet on floor); B LE ER with  wider BOS; ataxic gait; using posterior walker; ambulated clinic distances; SBA for safety  FUNCTIONAL TESTS:  10 meter walk test: 0.337 m/sec (29.66 seconds; used posterior walker)  PATIENT SURVEYS:  TBA                                                                                                                        TREATMENT DATE: 10/24/24  Sit to stands from elevated mat table: x5 trials; vc's and tactile cues for B knee flexion and assist for B foot placement; B feet blocked to prevent movement with standing; min assist for balance (pt pushing off mat table with B UE's to stand but no UE support otherwise d/t mirror in front of pt--no room for walker) Standing posture (in front of mirror): x1 minute x3 reps Notes: vc's for shifting/rotating L hip towards R side and externally rotating R LE; vc's for upright posture; min assist for balance (no UE support)  Gait:  Ambulation with RW x31 feet; CGA; vc's to keep feet within walker Notes: improved upright posture and foot clearance noted initially but pt fatigued with increased distance; pt intermittently catching feet  on tennis balls on back of walker d/t wider BOS  Therapeutic exercise (supine on mat table): x20 reps hip/knee flexion R LE PROM/stretching x20 reps hip/knee flexion L LE PROM/stretching 2x30 seconds L LE hip stretch into external rotation followed by 2x30 seconds R LE hip stretch into external rotation Notes: hip and knee flexed with foot on mat table; stabilization of opposite pelvis; L LE external rotation appearing more restricted than R LE  PATIENT EDUCATION: Education details: Continue HEP. Person educated: Patient and Grandma Agricultural Engineer) Education method: Explanation, Demonstration, and Verbal cues Education comprehension: verbalized understanding and needs further education  HOME EXERCISE PROGRAM: 09/19/24 Access Code: [email or web address] URL: [email or web address] Date: 10/08/2024 Prepared by: Damien Caulk  Exercises - Supine Hip and Knee Flexion PROM with Caregiver  - 1 x daily - 7 x weekly - 1-2 sets - 5 reps - Seated Knee Flexion Extension AROM   - 1 x daily - 7 x weekly - 1-2 sets - 5 reps  GOALS: Goals reviewed with patient? Yes  SHORT TERM GOALS: Target date: 10/17/2024  Pt will be supervision with initial HEP in order to improve strength, ROM, and balance in order to decrease fall risk and improve function at home for ADL's. Baseline: HEP issued Goal status: MET (pt's grandmother helping pt with ex's; pt downloaded app on phone so able to do some on own)  2.  Assess B hip PROM flexion after pt receives Botox  and adjust long term goal PROM if needed (to improve transfers and getting in/out car). Baseline: 75 degrees L hip and 90 degrees R hip (Eval); 112 degrees L hip and 100 degrees R hip 10/22/24 Goal status: MET  3.  Assess B knee flexion PROM after pt receives Botox  and adjust long term goal PROM if needed (to improve transfers and getting in/out of car). Baseline: R  knee 85 degrees and L knee 70 degrees (Eval); L knee 100 degrees and 103 degrees R knee  10/22/24 Goal status: MET   LONG TERM GOALS: Target date: 10/31/2024  Pt will be supervision with final HEP in order to improve strength, ROM, and balance in order to decrease fall risk and improve function at home for ADL's. Baseline: TBA Goal status: INITIAL  2.  Pt will increase by at least 0.13 m/s in order to demonstrate clinically significant improvement in community ambulation. Baseline: 0.337 m/sec (Eval) Goal status: INITIAL  3.  Pt will increase B hip PROM flexion to at least 115 degrees to improve transfers and getting in/out car. Baseline: 75 degrees L and 90 degrees R (Eval); 112 degrees L hip and 100 degrees R hip 10/22/24 Goal status: INITIAL  4.  Pt will increase B knee flexion PROM to at least 110 degrees to improve transfers and getting in/out of car. Baseline: R knee 85 degrees and L knee 70 degrees (Eval); L knee 100 degrees and 103 degrees R knee 10/22/24 Goal status: INITIAL  5.  Pt will demonstrate improved safety strategies and LE limb advancement with turns using walker. Baseline: SBA and vc's required to improve technique Goal status: INITIAL   ASSESSMENT:  CLINICAL IMPRESSION: Patient was seen today for physical therapy treatment to address posture, ambulation, and ROM.  Focused session on improving upright standing posture (visual feedback via mirror), ambulation with RW, and B LE stretching to improve ROM.  Patient continues to be limited by strength and LE spasticity.  They demonstrate improvement in upright standing posture with cueing and visual feedback via mirror.  Pt also demonstrates improved upright posture ambulating with RW but pt intermittently catching feet on tennis balls on back posts of walker; pt with decreased quality of posture when using posterior walker but has improved foot clearance d/t walker posterior (instead of anterior).  D/t foot clearance issues and pt fatiguing with upright posture with use of RW, posterior walker appears  to be better option at this time but pt would benefit from working on improving posture and postural endurance in order to improve gait quality with posterior walker.  They would continue to benefit from skilled PT to address impairments as noted and progress towards long term goals.  OBJECTIVE IMPAIRMENTS: Abnormal gait, decreased balance, decreased endurance, decreased mobility, difficulty walking, decreased ROM, decreased strength, impaired flexibility, impaired tone, improper body mechanics, and postural dysfunction.   ACTIVITY LIMITATIONS: carrying, lifting, bending, sitting, standing, squatting, stairs, transfers, bed mobility, bathing, toileting, dressing, and locomotion level  PARTICIPATION LIMITATIONS: meal prep, cleaning, laundry, driving, shopping, community activity, occupation, and yard work  PERSONAL FACTORS: Past/current experiences, Time since onset of injury/illness/exacerbation, and 3+ comorbidities: spastic diplegia CP, seizures, B feet sx, B hip and knee sx, R wrist sx are also affecting patient's functional outcome.   REHAB POTENTIAL: Fair d/t spasticity and time since onset of injury  CLINICAL DECISION MAKING: Evolving/moderate complexity  EVALUATION COMPLEXITY: Moderate  PLAN:  PT FREQUENCY: 1-2x/week (1x/week for 2 weeks then 2x/week for 4 weeks after receiving botox )  PT DURATION: 6 weeks  PLANNED INTERVENTIONS: 02835- PT Re-evaluation, 97750- Physical Performance Testing, 97110-Therapeutic exercises, 97530- Therapeutic activity, 97112- Neuromuscular re-education, 97535- Self Care, 02859- Manual therapy, [id number]- Gait training, [phone]- Orthotic Initial, [phone]- Orthotic/Prosthetic subsequent, [phone]- Aquatic Therapy, [phone]- Splinting, [phone]- Electrical stimulation (manual), [id number]- Ultrasound, Patient/Family education, Balance training, Stair training, Taping, Joint mobilization, Spinal mobilization, Cognitive remediation, DME instructions, Wheelchair mobility training,  Cryotherapy, Moist heat,  and Biofeedback  PLAN FOR NEXT SESSION: Options to fix walker--how did [employer] appointment for walker go?; LE stretching/ROM; functional strengthening; balance; gait training (improve foot clearance); work on turning technique with walker, look at R knee flexion and hip flexion to address extensor tone; // bars activity to improve foot clearance; upright standing posture (and endurance)  Damien Caulk, PT 10/24/2024, 5:08 PM

## 2024-10-26 LAB — FECAL LACTOFERRIN, QUANT: LACTOFERRIN, QL, STOOL: NEGATIVE

## 2024-10-26 LAB — PANCREATIC ELASTASE, FECAL: Pancreatic Elastase-1, Stool: >800 ug/g (ref >200)

## 2024-10-27 ENCOUNTER — Ambulatory Visit: Admitting: Physical Therapy

## 2024-10-29 ENCOUNTER — Ambulatory Visit: Admitting: Physical Therapy

## 2024-11-03 ENCOUNTER — Encounter: Payer: Self-pay | Admitting: Physical Therapy

## 2024-11-03 ENCOUNTER — Ambulatory Visit: Attending: Physical Medicine and Rehabilitation | Admitting: Physical Therapy

## 2024-11-03 DIAGNOSIS — M6281 Muscle weakness (generalized): Secondary | ICD-10-CM | POA: Diagnosis present

## 2024-11-03 DIAGNOSIS — R2689 Other abnormalities of gait and mobility: Secondary | ICD-10-CM | POA: Insufficient documentation

## 2024-11-03 DIAGNOSIS — R293 Abnormal posture: Secondary | ICD-10-CM | POA: Insufficient documentation

## 2024-11-03 DIAGNOSIS — M21371 Foot drop, right foot: Secondary | ICD-10-CM | POA: Diagnosis present

## 2024-11-03 DIAGNOSIS — G809 Cerebral palsy, unspecified: Secondary | ICD-10-CM | POA: Diagnosis present

## 2024-11-03 NOTE — Therapy (Signed)
 OUTPATIENT PHYSICAL THERAPY NEURO TREATMENT   Patient Name: Kerri Harris MRN: [id number] DOB:01/18/1991, 33 y.o., female Today's Date: 11/03/2024   PCP: Joshua Debby CROME, MD REFERRING PROVIDER: Emeline Joesph BROCKS, DO  END OF SESSION:  PT End of Session - 11/03/24 1321     Visit Number 7    Number of Visits 11   10 visits plus Eval   Date for Recertification  11/14/24   For scheduling delays   Authorization Type [employer]    Authorization Time Period 09/19/24-12/18/24    Authorization - Visit Number 7    Authorization - Number of Visits 12    PT Start Time 1318    PT Stop Time 1401    PT Time Calculation (min) 43 min    Equipment Utilized During Treatment Gait belt    Activity Tolerance Patient tolerated treatment well    Behavior During Therapy --   Flat affect intermittently          Past Medical History:  Diagnosis Date   Cerebral palsy (HCC)    legs stiff do not bend   Eczema    comes and goes per mother  on neck at times uses cocoa butter prn right neck drying out now per mother   Seizures (HCC)    Childhood seizures - not since age 81   Uses walker    in home for ambulation, uses wheelchair for distance   Past Surgical History:  Procedure Laterality Date   feet surgery Bilateral    HEMORRHOID SURGERY N/A 01/22/2018   Procedure: HEMORRHOIDECTOMY SINGLE COLUMN;  Surgeon: Debby Hila, MD;  Location: [facility];  Service: General;  Laterality: N/A;   HIP SURGERY Bilateral yrs ago   KNEE SURGERY Bilateral    REMOVAL OF DRUG DELIVERY IMPLANT Left 03/08/2021   Procedure: REMOVAL OF DRUG DELIVERY IMPLANT (NEXPLANON ) FROM  LEFT UPPER ARM;  Surgeon: Corene Coy, MD;  Location: [facility];  Service: Gynecology;  Laterality: Left;   right wrist surgery     cannot bend at wrist   ROBOTIC ASSISTED TOTAL HYSTERECTOMY Bilateral 03/08/2021   Procedure: XI ROBOTIC ASSISTED TOTAL HYSTERECTOMY WITH SALPINGECTOMY, CYSTOSCOPY;  Surgeon: Corene Coy,  MD;  Location: [facility];  Service: Gynecology;  Laterality: Bilateral;   Patient Active Problem List   Diagnosis Date Noted   Small intestinal bacterial overgrowth (SIBO) 10/15/2024   Need for immunization against influenza 10/14/2024   Hypokalemia due to loss of potassium 10/14/2024   Chronic diarrhea 10/13/2024   Muscle weakness (generalized) 06/20/2023   Other abnormalities of gait and mobility 06/20/2023   Right foot drop 06/20/2023   Spasticity 03/06/2023   Impaired mobility and ADLs 03/06/2023   Rocker bottom foot deformity 09/08/2019   Foot pain, bilateral 09/02/2019   Atopic dermatitis 11/14/2017   Encounter for general adult medical examination with abnormal findings 08/07/2017   Cerebral palsy (HCC) 07/20/2016   Needs assistance while at home 06/11/2015    ONSET DATE: 09/08/2024 (Date of referral)  REFERRING DIAG: G80.1 (ICD-10-CM) - Spastic diplegic cerebral palsy (HCC) R25.2 (ICD-10-CM) - Spasticity Z74.09,Z78.9 (ICD-10-CM) - Impaired mobility and ADLs  THERAPY DIAG:  Muscle weakness (generalized)  Other abnormalities of gait and mobility  Cerebral palsy, unspecified type (HCC)  Abnormal posture  Right foot drop  Rationale for Evaluation and Treatment: Rehabilitation  SUBJECTIVE:  SUBJECTIVE STATEMENT:   No recent falls.  Walker adjusted by company last week (piece broke off when adjusted but still safe to use per vendor; working much better).  Has new shower chair at grandmother's home that is working better (so pt able to sit instead of stand). Pt accompanied by: self and grandma Stasia)  PERTINENT HISTORY: Pt is a 33 y.o. female with PMH of spastic diplegic CP and uses walker.  Per PT referral: Evaluate and treat. Seen prior; now with increased difficulty  using walker, forgetting safety strategies / limb advancement with turns.  PMH includes: spastic diplegia CP, seizures, B feet sx, B hip and knee sx, R wrist sx (cannot bend at wrist).  PAIN:  Are you having pain? No  PRECAUTIONS: Fall  RED FLAGS: None   WEIGHT BEARING RESTRICTIONS: No  FALLS: Has patient fallen in last 6 months? No  LIVING ENVIRONMENT: Lives with: lives with their family (Mom and Dad) Lives in: House/apartment Bedroom main level Stairs: Yes: External: 1 plus 1 steps; none; 1 assist in/out of home (assist to lift walker) Has following equipment at home: Wheelchair (manual), Grab bars, and posterior walker ([place])  PLOF: Uses walker in home; w/c used when going out.  Modified independent with walking, showering, dressing, cleaning room.  PATIENT GOALS: Pt states she wants to get stronger with legs and be able to bend legs better.  Pt's mom wants pt to be able to bend legs better to get in/out of car.  OBJECTIVE:  Note: Objective measures were completed at Evaluation unless otherwise noted.  COGNITION: Overall cognitive status: History of cognitive impairments - at baseline   SENSATION: Light touch: WFL  COORDINATION: Unable to assess d/t significant LE extensor tone  MUSCLE TONE: Significant B LE extensor tone (L>R)  POSTURE: Mild R lean; posterior trunk lean with B LE knees almost straight in extension in sitting (d/t increased LE tone)  LOWER EXTREMITY ROM:     Passive  Right Eval Left Eval  Hip flexion  90 degrees 75 degrees  Hip extension    Hip abduction    Hip adduction    Hip internal rotation    Hip external rotation    Knee flexion 85 degrees 70 degrees  Knee extension Neutral Neutral  Ankle dorsiflexion Positioned in PF Positioned in PF  Ankle plantarflexion    Ankle inversion    Ankle eversion     (Blank rows = not tested)  LOWER EXTREMITY MMT:  Difficult to assess d/t increased B LE tone  MMT Right Eval Left Eval   Hip flexion    Hip extension    Hip abduction    Hip adduction    Hip internal rotation    Hip external rotation    Knee flexion    Knee extension    Ankle dorsiflexion 2/5 2/5  Ankle plantarflexion    Ankle inversion    Ankle eversion    (Blank rows = not tested)  BED MOBILITY:  Modified independent (uses walker) at baseline per pt and pt's mom report  TRANSFERS: SBA with walker (increased effort and time to perform on own with adaptive strategies; vc's required to improve technique)  GAIT: Findings: flexed trunk; B LE's appearing stiff (d/t tone); decreased foot clearance initially noted more on R but then was more pronounced on the L (pt intermittently catching feet on floor); B LE ER with wider BOS; ataxic gait; using posterior walker; ambulated clinic distances; SBA for safety  FUNCTIONAL TESTS:  10 meter walk test: 0.337 m/sec (29.66 seconds; used posterior walker).  0.32 m/sec with R AFO and posterior walker (30.38 seconds) on 11/03/24                                                                                                                        TREATMENT DATE: 11/03/24  Therapeutic Exercise: ROM measurements of B hips and knees (in sitting) for LTG's: 113 degrees L hip; 105 degrees R hip L knee 110 degrees; R knee 111 degrees  Therapeutic Activity: : 0.32 m/sec with R AFO and posterior walker (30.38 seconds) Sit to stands from mat table x4 trials: Vc's and min assist for B knee flexion and foot placement prior to standing (assist to keep feet in place); min assist to stand and control descent sitting Standing posture (feedback via mirror, vc's, and tactile cues): x3 minutes x2 sets Placing and removing squigz from mirror for balance challenge (min assist) Notes: consistent vc's to rotate L pelvis towards R, upright posture, and for midline posture   PATIENT EDUCATION: Education details: Continue HEP. Person educated: Patient and Grandma  Agricultural Engineer) Education method: Explanation, Demonstration, and Verbal cues Education comprehension: verbalized understanding and needs further education  HOME EXERCISE PROGRAM: 09/19/24 Access Code: [email or web address] URL: [email or web address] Date: 10/08/2024 Prepared by: Damien Caulk  Exercises - Supine Hip and Knee Flexion PROM with Caregiver  - 1 x daily - 7 x weekly - 1-2 sets - 5 reps - Seated Knee Flexion Extension AROM   - 1 x daily - 7 x weekly - 1-2 sets - 5 reps  GOALS: Goals reviewed with patient? Yes  SHORT TERM GOALS: Target date: 10/17/2024  Pt will be supervision with initial HEP in order to improve strength, ROM, and balance in order to decrease fall risk and improve function at home for ADL's. Baseline: HEP issued Goal status: MET (pt's grandmother helping pt with ex's; pt downloaded app on phone so able to do some on own)  2.  Assess B hip PROM flexion after pt receives Botox  and adjust long term goal PROM if needed (to improve transfers and getting in/out car). Baseline: 75 degrees L hip and 90 degrees R hip (Eval); 112 degrees L hip and 100 degrees R hip 10/22/24 Goal status: MET  3.  Assess B knee flexion PROM after pt receives Botox  and adjust long term goal PROM if needed (to improve transfers and getting in/out of car). Baseline: R knee 85 degrees and L knee 70 degrees (Eval); L knee 100 degrees and 103 degrees R knee 10/22/24 Goal status: MET   LONG TERM GOALS: Target date: 11/14/2024 (extended d/t scheduling delays)  Pt will be supervision with final HEP in order to improve strength, ROM, and balance in order to decrease fall risk and improve function at home for ADL's. Baseline: TBA Goal status: MET (pt's grandmother assists pt with exercises as needed)  2.  Pt will increase by at least 0.13 m/s in order to demonstrate clinically  significant improvement in community ambulation. Baseline: 0.337 m/sec (Eval; no R AFO); 0.32 m/sec with R  AFO Goal status: NOT MET (slightly slower; may be d/t R AFO--pt reports being heavy)  3.  Pt will increase B hip PROM flexion to at least 115 degrees to improve transfers and getting in/out car. Baseline: 75 degrees L and 90 degrees R (Eval); 112 degrees L hip and 100 degrees R hip 10/22/24; 113 degrees L; 105 degrees Goal status: PARTIALLY MET  4.  Pt will increase B knee flexion PROM to at least 110 degrees to improve transfers and getting in/out of car. Baseline: R knee 85 degrees and L knee 70 degrees (Eval); L knee 100 degrees and 103 degrees R knee 10/22/24; L knee 110 degrees and R knee 111 degrees 11/03/24 Goal status: MET  5.  Pt will demonstrate improved safety strategies and LE limb advancement with turns using walker. Baseline: SBA and vc's required to improve technique Goal status: NOT MET (pt still putting foot in air when turning)   ASSESSMENT:  CLINICAL IMPRESSION: Patient was seen today for physical therapy treatment to address LTG's***.  Focused session on ***.  Patient continues to be limited by ***.  They demonstrate improvement in ***.  They would continue to benefit from skilled PT to address impairments as noted and progress towards long term goals. *** Patient was seen today for physical therapy treatment to address posture, ambulation, and ROM.  Focused session on improving upright standing posture (visual feedback via mirror), ambulation with RW, and B LE stretching to improve ROM.  Patient continues to be limited by strength and LE spasticity.  They demonstrate improvement in upright standing posture with cueing and visual feedback via mirror.  Pt also demonstrates improved upright posture ambulating with RW but pt intermittently catching feet on tennis balls on back posts of walker; pt with decreased quality of posture when using posterior walker but has improved foot clearance d/t walker posterior (instead of anterior).  D/t foot clearance issues and pt fatiguing with  upright posture with use of RW, posterior walker appears to be better option at this time but pt would benefit from working on improving posture and postural endurance in order to improve gait quality with posterior walker.  They would continue to benefit from skilled PT to address impairments as noted and progress towards long term goals.  OBJECTIVE IMPAIRMENTS: Abnormal gait, decreased balance, decreased endurance, decreased mobility, difficulty walking, decreased ROM, decreased strength, impaired flexibility, impaired tone, improper body mechanics, and postural dysfunction.   ACTIVITY LIMITATIONS: carrying, lifting, bending, sitting, standing, squatting, stairs, transfers, bed mobility, bathing, toileting, dressing, and locomotion level  PARTICIPATION LIMITATIONS: meal prep, cleaning, laundry, driving, shopping, community activity, occupation, and yard work  PERSONAL FACTORS: Past/current experiences, Time since onset of injury/illness/exacerbation, and 3+ comorbidities: spastic diplegia CP, seizures, B feet sx, B hip and knee sx, R wrist sx are also affecting patient's functional outcome.   REHAB POTENTIAL: Fair d/t spasticity and time since onset of injury  CLINICAL DECISION MAKING: Evolving/moderate complexity  EVALUATION COMPLEXITY: Moderate  PLAN:  PT FREQUENCY: 1-2x/week (1x/week for 2 weeks then 2x/week for 4 weeks after receiving botox )  PT DURATION: 6 weeks  PLANNED INTERVENTIONS: [id number]- PT Re-evaluation, 97750- Physical Performance Testing, 97110-Therapeutic exercises, 97530- Therapeutic activity, [id number]- Neuromuscular re-education, 97535- Self Care, [id number]- Manual therapy, [id number]- Gait training, [id number]- Orthotic Initial, [id number]- Orthotic/Prosthetic subsequent, [id number]- Aquatic Therapy, [phone]- Splinting, [id number]- Electrical stimulation (manual), [id number]- Ultrasound, Patient/Family education, Balance training, Stair training, Taping, Joint  mobilization, Spinal mobilization, Cognitive  remediation, DME instructions, Wheelchair mobility training, Cryotherapy, Moist heat, and Biofeedback  PLAN FOR NEXT SESSION: Options to fix walker--how did [employer] appointment for walker go?; LE stretching/ROM; functional strengthening; balance; gait training (improve foot clearance); work on turning technique with walker, look at R knee flexion and hip flexion to address extensor tone; // bars activity to improve foot clearance; upright standing posture (and endurance)  Damien Caulk, PT 11/03/2024, 8:40 PM

## 2024-11-05 ENCOUNTER — Ambulatory Visit: Admitting: Physical Therapy

## 2024-11-05 DIAGNOSIS — G809 Cerebral palsy, unspecified: Secondary | ICD-10-CM

## 2024-11-05 DIAGNOSIS — R293 Abnormal posture: Secondary | ICD-10-CM

## 2024-11-05 DIAGNOSIS — M6281 Muscle weakness (generalized): Secondary | ICD-10-CM | POA: Diagnosis not present

## 2024-11-05 DIAGNOSIS — M21371 Foot drop, right foot: Secondary | ICD-10-CM

## 2024-11-05 DIAGNOSIS — R2689 Other abnormalities of gait and mobility: Secondary | ICD-10-CM

## 2024-11-05 NOTE — Therapy (Signed)
 OUTPATIENT PHYSICAL THERAPY NEURO TREATMENT   Patient Name: Kerri Harris MRN: [id number] DOB:1990/12/23, 33 y.o., female Today's Date: 11/05/2024   PCP: Joshua Debby CROME, MD REFERRING PROVIDER: Emeline Joesph BROCKS, DO  END OF SESSION:  PT End of Session - 11/05/24 1153     Visit Number 8    Number of Visits 11   10 visits plus Eval   Date for Recertification  11/14/24   For scheduling delays   Authorization Type [employer]    Authorization Time Period 09/19/24-12/18/24    Authorization - Number of Visits 12    PT Start Time 1150   pt arrived late   PT Stop Time 1230    PT Time Calculation (min) 40 min    Equipment Utilized During Treatment Gait belt    Activity Tolerance Patient tolerated treatment well    Behavior During Therapy WFL for tasks assessed/performed   Flat affect intermittently           Past Medical History:  Diagnosis Date   Cerebral palsy (HCC)    legs stiff do not bend   Eczema    comes and goes per mother  on neck at times uses cocoa butter prn right neck drying out now per mother   Seizures (HCC)    Childhood seizures - not since age 21   Uses walker    in home for ambulation, uses wheelchair for distance   Past Surgical History:  Procedure Laterality Date   feet surgery Bilateral    HEMORRHOID SURGERY N/A 01/22/2018   Procedure: HEMORRHOIDECTOMY SINGLE COLUMN;  Surgeon: Debby Hila, MD;  Location: [facility];  Service: General;  Laterality: N/A;   HIP SURGERY Bilateral yrs ago   KNEE SURGERY Bilateral    REMOVAL OF DRUG DELIVERY IMPLANT Left 03/08/2021   Procedure: REMOVAL OF DRUG DELIVERY IMPLANT (NEXPLANON ) FROM  LEFT UPPER ARM;  Surgeon: Corene Coy, MD;  Location: [facility];  Service: Gynecology;  Laterality: Left;   right wrist surgery     cannot bend at wrist   ROBOTIC ASSISTED TOTAL HYSTERECTOMY Bilateral 03/08/2021   Procedure: XI ROBOTIC ASSISTED TOTAL HYSTERECTOMY WITH SALPINGECTOMY, CYSTOSCOPY;  Surgeon:  Corene Coy, MD;  Location: [facility] [place];  Service: Gynecology;  Laterality: Bilateral;   Patient Active Problem List   Diagnosis Date Noted   Small intestinal bacterial overgrowth (SIBO) 10/15/2024   Need for immunization against influenza 10/14/2024   Hypokalemia due to loss of potassium 10/14/2024   Chronic diarrhea 10/13/2024   Muscle weakness (generalized) 06/20/2023   Other abnormalities of gait and mobility 06/20/2023   Right foot drop 06/20/2023   Spasticity 03/06/2023   Impaired mobility and ADLs 03/06/2023   Rocker bottom foot deformity 09/08/2019   Foot pain, bilateral 09/02/2019   Atopic dermatitis 11/14/2017   Encounter for general adult medical examination with abnormal findings 08/07/2017   Cerebral palsy (HCC) 07/20/2016   Needs assistance while at home 06/11/2015    ONSET DATE: 09/08/2024 (Date of referral)  REFERRING DIAG: G80.1 (ICD-10-CM) - Spastic diplegic cerebral palsy (HCC) R25.2 (ICD-10-CM) - Spasticity Z74.09,Z78.9 (ICD-10-CM) - Impaired mobility and ADLs  THERAPY DIAG:  Muscle weakness (generalized)  Other abnormalities of gait and mobility  Cerebral palsy, unspecified type (HCC)  Abnormal posture  Right foot drop  Rationale for Evaluation and Treatment: Rehabilitation  SUBJECTIVE:  SUBJECTIVE STATEMENT:    Pt's grandma did drop her getting her out of the shower yesterday, no injuries. [employer] is going to get the parts to get her walker fixed. Pt wearing her R AFO today, grandma has ordered [employer] to make it easier to get the shoes and AFO on/off.  Pt accompanied by: self and grandma Stasia)  PERTINENT HISTORY: Pt is a 33 y.o. female with PMH of spastic diplegic CP and uses walker.  Per PT referral: Evaluate and treat. Seen  prior; now with increased difficulty using walker, forgetting safety strategies / limb advancement with turns.  PMH includes: spastic diplegia CP, seizures, B feet sx, B hip and knee sx, R wrist sx (cannot bend at wrist).  PAIN:  Are you having pain? No  PRECAUTIONS: Fall  RED FLAGS: None   WEIGHT BEARING RESTRICTIONS: No  FALLS: Has patient fallen in last 6 months? No  LIVING ENVIRONMENT: Lives with: lives with their family (Mom and Dad) Lives in: House/apartment Bedroom main level Stairs: Yes: External: 1 plus 1 steps; none; 1 assist in/out of home (assist to lift walker) Has following equipment at home: Wheelchair (manual), Grab bars, and posterior walker ([place])  PLOF: Uses walker in home; w/c used when going out.  Modified independent with walking, showering, dressing, cleaning room.  PATIENT GOALS: Pt states she wants to get stronger with legs and be able to bend legs better.  Pt's mom wants pt to be able to bend legs better to get in/out of car.  OBJECTIVE:  Note: Objective measures were completed at Evaluation unless otherwise noted.  COGNITION: Overall cognitive status: History of cognitive impairments - at baseline   SENSATION: Light touch: WFL  COORDINATION: Unable to assess d/t significant LE extensor tone  MUSCLE TONE: Significant B LE extensor tone (L>R)  POSTURE: Mild R lean; posterior trunk lean with B LE knees almost straight in extension in sitting (d/t increased LE tone)  LOWER EXTREMITY ROM:     Passive  Right Eval Left Eval  Hip flexion  90 degrees 75 degrees  Hip extension    Hip abduction    Hip adduction    Hip internal rotation    Hip external rotation    Knee flexion 85 degrees 70 degrees  Knee extension Neutral Neutral  Ankle dorsiflexion Positioned in PF Positioned in PF  Ankle plantarflexion    Ankle inversion    Ankle eversion     (Blank rows = not tested)  LOWER EXTREMITY MMT:  Difficult to assess d/t increased B LE  tone  MMT Right Eval Left Eval  Hip flexion    Hip extension    Hip abduction    Hip adduction    Hip internal rotation    Hip external rotation    Knee flexion    Knee extension    Ankle dorsiflexion 2/5 2/5  Ankle plantarflexion    Ankle inversion    Ankle eversion    (Blank rows = not tested)  BED MOBILITY:  Modified independent (uses walker) at baseline per pt and pt's mom report  TRANSFERS: SBA with walker (increased effort and time to perform on own with adaptive strategies; vc's required to improve technique)  GAIT: Findings: flexed trunk; B LE's appearing stiff (d/t tone); decreased foot clearance initially noted more on R but then was more pronounced on the L (pt intermittently catching feet on floor); B LE ER with wider BOS; ataxic gait; using posterior walker; ambulated clinic distances; SBA for  safety  FUNCTIONAL TESTS:  10 meter walk test: 0.337 m/sec (29.66 seconds; used posterior walker).  0.32 m/sec with R AFO and posterior walker (30.38 seconds) on 11/03/24                                                                                                                        TREATMENT DATE: 11/05/24   Therapeutic Activity: In // bars with BUE support: To work on increasing RLE step length and clearance: Forwards trekking pole stepovers 6 x 10 ft Max cueing for LE placement To work on increasing knee flexion in standing Alt L/R 4 eccentric step downs Cues to bend knees like you are getting down to the floor Lateral weight shift all the way to contralateral limb when stepping down Standing alt L/R marches x 5 reps B Pt unable to lift LE up to even 90 degrees Standing alt L/R HS curls x 5 reps B Needs max A to perform due to LE spasticity   PATIENT EDUCATION: Education details: Continue HEP.  Remaining visits discussed. Person educated: Patient and Grandma Agricultural Engineer) Education method: Explanation, Demonstration, and Verbal cues Education comprehension:  verbalized understanding and needs further education  HOME EXERCISE PROGRAM: 09/19/24 Access Code: [email or web address] URL: [email or web address] Date: 10/08/2024 Prepared by: Damien Caulk  Exercises - Supine Hip and Knee Flexion PROM with Caregiver  - 1 x daily - 7 x weekly - 1-2 sets - 5 reps - Seated Knee Flexion Extension AROM   - 1 x daily - 7 x weekly - 1-2 sets - 5 reps  GOALS: Goals reviewed with patient? Yes  SHORT TERM GOALS: Target date: 10/17/2024  Pt will be supervision with initial HEP in order to improve strength, ROM, and balance in order to decrease fall risk and improve function at home for ADL's. Baseline: HEP issued Goal status: MET (pt's grandmother helping pt with ex's; pt downloaded app on phone so able to do some on own)  2.  Assess B hip PROM flexion after pt receives Botox  and adjust long term goal PROM if needed (to improve transfers and getting in/out car). Baseline: 75 degrees L hip and 90 degrees R hip (Eval); 112 degrees L hip and 100 degrees R hip 10/22/24 Goal status: MET  3.  Assess B knee flexion PROM after pt receives Botox  and adjust long term goal PROM if needed (to improve transfers and getting in/out of car). Baseline: R knee 85 degrees and L knee 70 degrees (Eval); L knee 100 degrees and 103 degrees R knee 10/22/24 Goal status: MET   LONG TERM GOALS: Target date: 11/14/2024 (extended d/t scheduling delays)  Pt will be supervision with final HEP in order to improve strength, ROM, and balance in order to decrease fall risk and improve function at home for ADL's. Baseline: HEP issued Goal status: MET (pt's grandmother assists pt with exercises as needed)  2.  Pt will increase by at least 0.13 m/s in order to demonstrate clinically significant improvement in  community ambulation. Baseline: 0.337 m/sec (Eval; no R AFO); 0.32 m/sec with R AFO 11/03/24 Goal status: NOT MET (slightly slower; may be d/t R AFO--pt reports being  heavy)  3.  Pt will increase B hip PROM flexion to at least 115 degrees to improve transfers and getting in/out car. Baseline: 75 degrees L and 90 degrees R (Eval); 112 degrees L hip and 100 degrees R hip 10/22/24; 113 degrees L and 105 degrees R hip 11/03/24 Goal status: PARTIALLY MET/PROGRESSING  4.  Pt will increase B knee flexion PROM to at least 110 degrees to improve transfers and getting in/out of car. Baseline: R knee 85 degrees and L knee 70 degrees (Eval); L knee 100 degrees and 103 degrees R knee 10/22/24; L knee 110 degrees and R knee 111 degrees 11/03/24 Goal status: MET  5.  Pt will demonstrate improved safety strategies and LE limb advancement with turns using walker. Baseline: SBA and vc's required to improve technique Goal status: NOT MET (pt still putting foot in air when turning)   ASSESSMENT:  CLINICAL IMPRESSION: Emphasis of skilled PT session on continuing to work on increasing RLE step length and height with gait and working on increasing B knee flexion with standing tasks. Pt does exhibit improved RLE clearance during gait with her posterior walker, improved upright posture with gait, and improved ability to flex her knees when sitting. She does continue to struggle to perform knee flexion in standing and with gait due to LE spasticity. She continues to benefit from skilled PT services to work towards LTGs. Continue POC.   OBJECTIVE IMPAIRMENTS: Abnormal gait, decreased balance, decreased endurance, decreased mobility, difficulty walking, decreased ROM, decreased strength, impaired flexibility, impaired tone, improper body mechanics, and postural dysfunction.   ACTIVITY LIMITATIONS: carrying, lifting, bending, sitting, standing, squatting, stairs, transfers, bed mobility, bathing, toileting, dressing, and locomotion level  PARTICIPATION LIMITATIONS: meal prep, cleaning, laundry, driving, shopping, community activity, occupation, and yard work  PERSONAL FACTORS:  Past/current experiences, Time since onset of injury/illness/exacerbation, and 3+ comorbidities: spastic diplegia CP, seizures, B feet sx, B hip and knee sx, R wrist sx are also affecting patient's functional outcome.   REHAB POTENTIAL: Fair d/t spasticity and time since onset of injury  CLINICAL DECISION MAKING: Evolving/moderate complexity  EVALUATION COMPLEXITY: Moderate  PLAN:  PT FREQUENCY: 1-2x/week (1x/week for 2 weeks then 2x/week for 4 weeks after receiving botox )  PT DURATION: 6 weeks  PLANNED INTERVENTIONS: 02835- PT Re-evaluation, 97750- Physical Performance Testing, 97110-Therapeutic exercises, 97530- Therapeutic activity, 97112- Neuromuscular re-education, 97535- Self Care, 02859- Manual therapy, [id number]- Gait training, [phone]- Orthotic Initial, [phone]- Orthotic/Prosthetic subsequent, [phone]- Aquatic Therapy, [phone]- Splinting, [phone]- Electrical stimulation (manual), [phone]- Ultrasound, Patient/Family education, Balance training, Stair training, Taping, Joint mobilization, Spinal mobilization, Cognitive remediation, DME instructions, Wheelchair mobility training, Cryotherapy, Moist heat, and Biofeedback  PLAN FOR NEXT SESSION: LE stretching/ROM; functional strengthening; balance; gait training (improve foot clearance); work on turning technique with walker, look at R knee flexion and hip flexion to address extensor tone; // bars activity to improve foot clearance; upright standing posture (also balance and endurance in standing)  Waddell Southgate, PT Waddell Southgate, PT, DPT, CSRS  11/05/2024, 12:33 PM

## 2024-11-10 ENCOUNTER — Ambulatory Visit: Admitting: Physical Therapy

## 2024-11-12 ENCOUNTER — Encounter: Payer: Self-pay | Admitting: Physical Therapy

## 2024-11-12 ENCOUNTER — Ambulatory Visit: Admitting: Physical Therapy

## 2024-11-12 DIAGNOSIS — M6281 Muscle weakness (generalized): Secondary | ICD-10-CM | POA: Diagnosis not present

## 2024-11-12 DIAGNOSIS — R2689 Other abnormalities of gait and mobility: Secondary | ICD-10-CM

## 2024-11-12 DIAGNOSIS — M21371 Foot drop, right foot: Secondary | ICD-10-CM

## 2024-11-12 DIAGNOSIS — R293 Abnormal posture: Secondary | ICD-10-CM

## 2024-11-12 DIAGNOSIS — G809 Cerebral palsy, unspecified: Secondary | ICD-10-CM

## 2024-11-12 NOTE — Therapy (Signed)
 OUTPATIENT PHYSICAL THERAPY NEURO TREATMENT  PHYSICAL THERAPY DISCHARGE SUMMARY  Visits from Start of Care: 9  Current functional level related to goals / functional outcomes: Ambulatory with posterior walker and R AFO.  Significant improvement in B knee flexion and improved B hip flexion ROM since therapy evaluation. Improved RLE clearance and upright posture during gait with her posterior walker.  Improved ability to flex her knees when sitting.   Remaining deficits: Gait speed; hip flexion ROM; turning strategies; LE spasticity.   Education / Equipment: HEP issued.  Continue hip and knee ROM.  Continue to use R AFO and posterior walker.   Patient and pt's grandmother agrees to discharge. Patient goals were partially met. Patient is being discharged due to maximized rehab potential.    Patient Name: Kerri Harris MRN: [id number] DOB:1991-06-20, 33 y.o., female Today's Date: 11/12/2024   PCP: Joshua Debby CROME, MD REFERRING PROVIDER: Emeline Joesph BROCKS, DO  END OF SESSION:  PT End of Session - 11/12/24 1539     Visit Number 9    Number of Visits 11   10 visits plus Eval   Date for Recertification  11/14/24   For scheduling delays   Authorization Type [employer]    Authorization Time Period 09/19/24-12/18/24    Authorization - Visit Number 9    Authorization - Number of Visits 12    PT Start Time 1535   Therapist running late   PT Stop Time 1620    PT Time Calculation (min) 45 min    Equipment Utilized During Treatment Gait belt    Activity Tolerance Patient tolerated treatment well    Behavior During Therapy WFL for tasks assessed/performed   Flat affect intermittently           Past Medical History:  Diagnosis Date   Cerebral palsy (HCC)    legs stiff do not bend   Eczema    comes and goes per mother  on neck at times uses cocoa butter prn right neck drying out now per mother   Seizures (HCC)    Childhood seizures - not since age 33   Uses walker    in home for  ambulation, uses wheelchair for distance   Past Surgical History:  Procedure Laterality Date   feet surgery Bilateral    HEMORRHOID SURGERY N/A 01/22/2018   Procedure: HEMORRHOIDECTOMY SINGLE COLUMN;  Surgeon: Debby Hila, MD;  Location: [facility];  Service: General;  Laterality: N/A;   HIP SURGERY Bilateral yrs ago   KNEE SURGERY Bilateral    REMOVAL OF DRUG DELIVERY IMPLANT Left 03/08/2021   Procedure: REMOVAL OF DRUG DELIVERY IMPLANT (NEXPLANON ) FROM  LEFT UPPER ARM;  Surgeon: Corene Coy, MD;  Location: [facility];  Service: Gynecology;  Laterality: Left;   right wrist surgery     cannot bend at wrist   ROBOTIC ASSISTED TOTAL HYSTERECTOMY Bilateral 03/08/2021   Procedure: XI ROBOTIC ASSISTED TOTAL HYSTERECTOMY WITH SALPINGECTOMY, CYSTOSCOPY;  Surgeon: Corene Coy, MD;  Location: [facility];  Service: Gynecology;  Laterality: Bilateral;   Patient Active Problem List   Diagnosis Date Noted   Small intestinal bacterial overgrowth (SIBO) 10/15/2024   Need for immunization against influenza 10/14/2024   Hypokalemia due to loss of potassium 10/14/2024   Chronic diarrhea 10/13/2024   Muscle weakness (generalized) 06/20/2023   Other abnormalities of gait and mobility 06/20/2023   Right foot drop 06/20/2023   Spasticity 03/06/2023   Impaired mobility and ADLs 03/06/2023   Rocker bottom foot deformity  09/08/2019   Foot pain, bilateral 09/02/2019   Atopic dermatitis 11/14/2017   Encounter for general adult medical examination with abnormal findings 08/07/2017   Cerebral palsy (HCC) 07/20/2016   Needs assistance while at home 06/11/2015    ONSET DATE: 09/08/2024 (Date of referral)  REFERRING DIAG: G80.1 (ICD-10-CM) - Spastic diplegic cerebral palsy (HCC) R25.2 (ICD-10-CM) - Spasticity Z74.09,Z78.9 (ICD-10-CM) - Impaired mobility and ADLs  THERAPY DIAG:  Muscle weakness (generalized)  Other abnormalities of gait and  mobility  Cerebral palsy, unspecified type (HCC)  Abnormal posture  Right foot drop  Rationale for Evaluation and Treatment: Rehabilitation  SUBJECTIVE:                                                                                                                                                                                             SUBJECTIVE STATEMENT:   Pt arrives wearing Billy shoes; pt wearing R AFO more now d/t easier to put on shoes and shoes are lighter.  No falls since last therapy session.  Pt completed antibiotic; still taking potassium medication.  Last scheduled appointment with physical therapy today. Pt accompanied by: self and grandma Stasia)  PERTINENT HISTORY: Pt is a 33 y.o. female with PMH of spastic diplegic CP and uses walker.  Per PT referral: Evaluate and treat. Seen prior; now with increased difficulty using walker, forgetting safety strategies / limb advancement with turns.  PMH includes: spastic diplegia CP, seizures, B feet sx, B hip and knee sx, R wrist sx (cannot bend at wrist).  PAIN:  Are you having pain? No  PRECAUTIONS: Fall  RED FLAGS: None   WEIGHT BEARING RESTRICTIONS: No  FALLS: Has patient fallen in last 6 months? No  LIVING ENVIRONMENT: Lives with: lives with their family (Mom and Dad) Lives in: House/apartment Bedroom main level Stairs: Yes: External: 1 plus 1 steps; none; 1 assist in/out of home (assist to lift walker) Has following equipment at home: Wheelchair (manual), Grab bars, and posterior walker ([place] by [employer])  PLOF: Uses walker in home; w/c used when going out.  Modified independent with walking, showering, dressing, cleaning room.  PATIENT GOALS: Pt states she wants to get stronger with legs and be able to bend legs better.  Pt's mom wants pt to be able to bend legs better to get in/out of car.  OBJECTIVE:  Note: Objective measures were completed at Evaluation unless otherwise noted.  COGNITION: Overall  cognitive status: History of cognitive impairments - at baseline   SENSATION: Light touch: WFL  COORDINATION: Unable to assess d/t significant LE extensor tone  MUSCLE TONE: Significant B LE extensor tone (L>R)  POSTURE: Mild R lean; posterior trunk lean with B LE knees almost straight in extension in sitting (d/t increased LE tone)  LOWER EXTREMITY ROM:     Passive  Right Eval Left Eval  Hip flexion  90 degrees 75 degrees  Hip extension    Hip abduction    Hip adduction    Hip internal rotation    Hip external rotation    Knee flexion 85 degrees 70 degrees  Knee extension Neutral Neutral  Ankle dorsiflexion Positioned in PF Positioned in PF  Ankle plantarflexion    Ankle inversion    Ankle eversion     (Blank rows = not tested)  LOWER EXTREMITY MMT:  Difficult to assess d/t increased B LE tone  MMT Right Eval Left Eval  Hip flexion    Hip extension    Hip abduction    Hip adduction    Hip internal rotation    Hip external rotation    Knee flexion    Knee extension    Ankle dorsiflexion 2/5 2/5  Ankle plantarflexion    Ankle inversion    Ankle eversion    (Blank rows = not tested)  BED MOBILITY:  Modified independent (uses walker) at baseline per pt and pt's mom report  TRANSFERS: SBA with walker (increased effort and time to perform on own with adaptive strategies; vc's required to improve technique)  GAIT: Findings: flexed trunk; B LE's appearing stiff (d/t tone); decreased foot clearance initially noted more on R but then was more pronounced on the L (pt intermittently catching feet on floor); B LE ER with wider BOS; ataxic gait; using posterior walker; ambulated clinic distances; SBA for safety  FUNCTIONAL TESTS:  10 meter walk test: 0.337 m/sec (29.66 seconds; used posterior walker).  0.32 m/sec with R AFO and posterior walker (30.38 seconds) on 11/03/24; 0.32 m/sec 11/12/24 with R AFO and posterior walker                                                                                                                         TREATMENT DATE: 11/12/24  Therapeutic Exercise: x20 reps PROM hip/knee flexion B LE's in supine prior to assessing B hip and knee ROM in supine. B hip and knee ROM measurements (see LTG's below for details)  Therapeutic Activity: : 0.32 m/sec with R AFO and posterior walker Floor transfer: modified independent mat table to/from tall kneeling on mat on floor Standing posture and balance (visual feedback via mirror) x1.5 minutes with squigz activity (placing/removing squigz on mirror) per pt request; CGA to min assist for balance; vc's for upright posture and hip/pelvic positioning to midline   PATIENT EDUCATION: Education details: Continue HEP with family support.  LTG results.  POC options. Discharge from PT today. Person educated: Patient and Grandma Agricultural Engineer) Education method: Explanation, Demonstration, and Verbal cues Education comprehension: verbalized understanding and needs further education  HOME EXERCISE PROGRAM: 09/19/24 Access Code: [email or web address] URL: [email or web address] Date: 10/08/2024 Prepared by: Damien Caulk  Exercises -  Supine Hip and Knee Flexion PROM with Caregiver  - 1 x daily - 7 x weekly - 1-2 sets - 5 reps - Seated Knee Flexion Extension AROM   - 1 x daily - 7 x weekly - 1-2 sets - 5 reps  GOALS: Goals reviewed with patient? Yes  SHORT TERM GOALS: Target date: 10/17/2024  Pt will be supervision with initial HEP in order to improve strength, ROM, and balance in order to decrease fall risk and improve function at home for ADL's. Baseline: HEP issued Goal status: MET (pt's grandmother helping pt with ex's; pt downloaded app on phone so able to do some on own)  2.  Assess B hip PROM flexion after pt receives Botox  and adjust long term goal PROM if needed (to improve transfers and getting in/out car). Baseline: 75 degrees L hip and 90 degrees R hip (Eval); 112 degrees L  hip and 100 degrees R hip 10/22/24 Goal status: MET  3.  Assess B knee flexion PROM after pt receives Botox  and adjust long term goal PROM if needed (to improve transfers and getting in/out of car). Baseline: R knee 85 degrees and L knee 70 degrees (Eval); L knee 100 degrees and 103 degrees R knee 10/22/24 Goal status: MET   LONG TERM GOALS: Target date: 11/14/2024 (extended d/t scheduling delays)  Pt will be supervision with final HEP in order to improve strength, ROM, and balance in order to decrease fall risk and improve function at home for ADL's. Baseline: HEP issued Goal status: MET (pt's grandmother and family assists pt with exercises as needed)  2.  Pt will increase by at least 0.13 m/s in order to demonstrate clinically significant improvement in community ambulation. Baseline: 0.337 m/sec (Eval; no R AFO); 0.32 m/sec with R AFO 11/03/24; 0.32 m/sec (30.69 seconds) with R AFO 11/12/24 Goal status: NOT MET  3.  Pt will increase B hip PROM flexion to at least 115 degrees to improve transfers and getting in/out car. Baseline: 75 degrees L and 90 degrees R (Eval); 112 degrees L hip and 100 degrees R hip 10/22/24; 113 degrees L and 105 degrees R hip 11/03/24; 110 degrees L hip flexion and 103 degrees R hip flexion 11/12/24 Goal status: PARTIALLY MET/PROGRESSING  4.  Pt will increase B knee flexion PROM to at least 110 degrees to improve transfers and getting in/out of car. Baseline: R knee 85 degrees and L knee 70 degrees (Eval); L knee 100 degrees and 103 degrees R knee 10/22/24; L knee 110 degrees and R knee 111 degrees 11/03/24; 130 degrees L and 124 degrees R 11/12/24 Goal status: MET  5.  Pt will demonstrate improved safety strategies and LE limb advancement with turns using walker. Baseline: SBA and vc's required to improve technique Goal status: NOT MET (pt still putting foot in air when turning)   ASSESSMENT:  CLINICAL IMPRESSION: Patient was seen today for physical  therapy treatment to address LTG's and POC.  Focused session on addressing LTG's and discussing POC.  Pt met 2/5 LTG's, progressing with 1/5 LTG's, and did not meet 2/5 LTG's.  Pt's grandmother and pt's family assist pt with HEP (goal met).  Pt met B knee flexion ROM goals (L knee 130 degrees and R knee 124 degrees).  Pt has been progressing with B hip flexion ROM goal but no significant ROM gain noted since last measurement.  Pt did not meet goal (pt staying consistent with gait speed since last assessment) and safety strategies  goal with transfers.  Discussed with pt and pt's grandmother requesting 3 more visits to work towards above goals (and pt's rehab potential) or discharging from therapy (with pt continuing HEP with assist of family).  Pt's grandmother choosing discharge from therapy at this time and family will continue HEP with pt (and grandmother also works with pt in pool when able).  Plan to discharge from PT today.   OBJECTIVE IMPAIRMENTS: Abnormal gait, decreased balance, decreased endurance, decreased mobility, difficulty walking, decreased ROM, decreased strength, impaired flexibility, impaired tone, improper body mechanics, and postural dysfunction.   ACTIVITY LIMITATIONS: carrying, lifting, bending, sitting, standing, squatting, stairs, transfers, bed mobility, bathing, toileting, dressing, and locomotion level  PARTICIPATION LIMITATIONS: meal prep, cleaning, laundry, driving, shopping, community activity, occupation, and yard work  PERSONAL FACTORS: Past/current experiences, Time since onset of injury/illness/exacerbation, and 3+ comorbidities: spastic diplegia CP, seizures, B feet sx, B hip and knee sx, R wrist sx are also affecting patient's functional outcome.   REHAB POTENTIAL: Fair d/t spasticity and time since onset of injury  CLINICAL DECISION MAKING: Evolving/moderate complexity  EVALUATION COMPLEXITY: Moderate  PLAN:  PT FREQUENCY: 1-2x/week (1x/week for 2 weeks  then 2x/week for 4 weeks after receiving botox )  PT DURATION: 6 weeks  PLANNED INTERVENTIONS: 02835- PT Re-evaluation, 97750- Physical Performance Testing, 97110-Therapeutic exercises, 97530- Therapeutic activity, 97112- Neuromuscular re-education, 97535- Self Care, 02859- Manual therapy, [id number]- Gait training, [phone]- Orthotic Initial, [phone]- Orthotic/Prosthetic subsequent, [phone]- Aquatic Therapy, [phone]- Splinting, [phone]- Electrical stimulation (manual), [phone]- Ultrasound, Patient/Family education, Balance training, Stair training, Taping, Joint mobilization, Spinal mobilization, Cognitive remediation, DME instructions, Wheelchair mobility training, Cryotherapy, Moist heat, and Biofeedback  PLAN FOR NEXT SESSION: Discharge from PT today (11/12/24)  Damien Caulk, PT 11/12/2024, 7:55 PM

## 2024-11-17 ENCOUNTER — Encounter: Payer: Self-pay | Admitting: Physical Medicine and Rehabilitation

## 2024-11-17 ENCOUNTER — Encounter: Attending: Physical Medicine and Rehabilitation | Admitting: Physical Medicine and Rehabilitation

## 2024-11-17 VITALS — BP 125/77 | HR 100 | Ht 59.0 in | Wt 119.0 lb

## 2024-11-17 DIAGNOSIS — Z789 Other specified health status: Secondary | ICD-10-CM | POA: Diagnosis present

## 2024-11-17 DIAGNOSIS — R252 Cramp and spasm: Secondary | ICD-10-CM

## 2024-11-17 DIAGNOSIS — Z7409 Other reduced mobility: Secondary | ICD-10-CM | POA: Insufficient documentation

## 2024-11-17 DIAGNOSIS — G801 Spastic diplegic cerebral palsy: Secondary | ICD-10-CM | POA: Diagnosis present

## 2024-11-17 MED ORDER — DANTROLENE SODIUM 25 MG PO CAPS: ORAL_CAPSULE | ORAL | 0 refills | Status: AC

## 2024-11-17 MED ORDER — TIZANIDINE HCL 4 MG PO TABS: 2.0000 mg | ORAL_TABLET | Freq: Every evening | ORAL | 5 refills | Status: AC | PRN

## 2024-11-17 NOTE — Patient Instructions (Addendum)
°  VISIT SUMMARY: You had a follow-up visit to discuss your progress after completing physical therapy. You have been walking better with your adjusted walker and new shoes, and you are using your braces more often without skin issues. We discussed your ongoing leg spasms and the effectiveness of your current medications.  YOUR PLAN: SPASTIC DIPLEGIC CEREBRAL PALSY: You have chronic spasticity affecting your lower extremities. Botox  has improved your right leg mobility, but your left leg remains stiff. -Continue your current Botox  dosage while we appeal the insurance decision. -Continue taking tizanidine  at night. -Start taking dantrolene  25 mg once daily for one week, then we will perform liver function tests. -If your liver function tests are normal, we will increase the dantrolene  to three times daily. -We will perform liver function tests again after one month of the increased dantrolene  dosage. -Monitor your mobility and we will adjust your treatment as needed.  SPASTICITY: Your spasticity affects your lower extremities, with the left leg being more affected than the right. -Continue taking tizanidine  at night. -Start taking dantrolene  25 mg once daily for one week, then we will perform liver function tests. -If your liver function tests are normal, we will increase the dantrolene  to three times daily. -We will perform liver function tests again after one month of the increased dantrolene  dosage.  IMPAIRED MOBILITY AND ACTIVITIES OF DAILY LIVING (ADLS): Your mobility has improved with physical therapy and the use of a walker. However, spasticity in your left leg still affects your walking and daily activities. -Continue doing your physical therapy exercises at home. -Use your walker and braces as needed. -Monitor your mobility and we will adjust your therapy as needed.    Contains text generated by [employer].

## 2024-11-17 NOTE — Progress Notes (Signed)
 Subjective:    Patient ID: Kerri Harris, female    DOB: Sep 17, 1991, 33 y.o.   MRN: [id number]  HPI   Kerri Harris is a 33 y.o. year old female  who  has a past medical history of Cerebral palsy (HCC), Eczema, Seizures (HCC), and Uses walker.   They are presenting to PM&R clinic for follow up related to spastic diplegic CP .  Plan from last visit:  Spastic diplegic cerebral palsy (HCC) Spasticity   Stop baclofen  due to gastrointestinal side effects.  Start tizanidine  at nighttime for spasms; start with one half tab, then progressed up to 1 tab if it does not make her too sedated.   Come back at the end of October for repeat Botox  for reducing spasms, positioning, and preventing skin breakdown and reducing caregiver burden.   We did briefly discuss neurolysis to the bilateral lower extremities versus baclofen  pump, but given that patient remains ambulatory most of the time we want to maintain some of her tone.  Therefore, we will continue current management.   Impaired mobility and ADLs I am sending you back to PT with the neurorehab to work on gait with your walker, especially with turns as grandma describes patient forgetting to place down her left foot during these.     Also, they should be able to troubleshoot problems with the walker and talk to [facility] for any adjustments with the handles. Has restarted PT, feeling good. Kerri Harris is trying to encourage use of her new braces and use of her walker. Has only been taking 1/2 tabs tizanidine , no s/e, ok with increasing to a whole pill.    800 U total  Iliopsoas - 50 U R, 25 U L Adductor long R 50  ,L 50  Adductor mag R 50  ,L 50  Vastus intermedius R  50/50 -> 75 R ,L 50 L Vastus medialis R 50   ,L 50 Rectus Femoris R  50/50 -> 100 x2 spots ,L 100 x2 spots Gastrocnemius R 50, L 50   Interval Hx:  Discussed the use of AI scribe software for clinical note transcription with the patient, who gave verbal consent to  proceed.  History of Present Illness   Kerri Harris is a 33 year old female with mobility issues who presents for follow-up after completing physical therapy. She is accompanied by her caregiver.  She feels she has made good progress after PT. She is walking better with her adjusted walker and new shoes that fit over her braces.  She is using her braces more often without skin breakdown and continues home exercises focused on leg separation and mobility.  She has intermittent leg spasms that cause her to jump. She is walking more inside the house with the walker and can get in and out of the car with minimal help, though the right leg is more difficult.  She received Botox  injections in early November. Tone in the right leg has improved, but the left leg remains stiff. She takes tizanidine  one tablet at night, which helps sleep and spasms without daytime sleepiness.  She previously had stomach issues with baclofen  and avoids it. Her caregiver manages her nighttime tizanidine  to ensure adherence.  She plans to travel to [place] in August for her sister's wedding. She denies leg pain, fatigue, or dizziness on her current regimen, but still has spasms.         Pain Inventory Average Pain 0 Pain Right Now 0 My pain is N/A  In the last 24 hours, has pain interfered with the following? General activity 0 Relation with others 0 Enjoyment of life 0 What TIME of day is your pain at its worst? varies Sleep (in general) Good  Pain is worse with: No pain Pain improves with: No pain Relief from Meds: No pain  Family History  Problem Relation Age of Onset   Arthritis Mother    Hypertension Maternal Grandmother    Diabetes Maternal Grandmother    Social History   Socioeconomic History   Marital status: Single    Spouse name: Not on file   Number of children: 0   Years of education: 12   Highest education level: 12th grade  Occupational History   Not on file  Tobacco Use    Smoking status: Never   Smokeless tobacco: Never  Vaping Use   Vaping status: Never Used  Substance and Sexual Activity   Alcohol use: No    Alcohol/week: 0.0 standard drinks of alcohol   Drug use: No   Sexual activity: Never  Other Topics Concern   Not on file  Social History Narrative   Fun: Play and exercise, read   Denies religious beliefs effecting health care.    Social Drivers of Health   Tobacco Use: Low Risk (11/17/2024)   Patient History    Smoking Tobacco Use: Never    Smokeless Tobacco Use: Never    Passive Exposure: Not on file  Financial Resource Strain: Low Risk (10/09/2024)   Overall Financial Resource Strain (CARDIA)    Difficulty of Paying Living Expenses: Not hard at all  Food Insecurity: No Food Insecurity (10/09/2024)   Epic    Worried About Programme Researcher, Broadcasting/film/video in the Last Year: Never true    Ran Out of Food in the Last Year: Never true  Transportation Needs: No Transportation Needs (10/09/2024)   Epic    Lack of Transportation (Medical): No    Lack of Transportation (Non-Medical): No  Physical Activity: Unknown (09/19/2023)   Exercise Vital Sign    Days of Exercise per Week: 7 days    Minutes of Exercise per Session: Patient declined  Stress: No Stress Concern Present (09/19/2023)   Kerri [facility] [employer] Occupational Health - Occupational Stress Questionnaire    Feeling of Stress : Not at all  Social Connections: Moderately Isolated (10/09/2024)   Social Connection and Isolation Panel    Frequency of Communication with Friends and Family: More than three times a week    Frequency of Social Gatherings with Friends and Family: Twice a week    Attends Religious Services: 1 to 4 times per year    Active Member of [employer] or Organizations: No    Attends Engineer, Structural: Not on file    Marital Status: Never married  Depression (PHQ2-9): Low Risk (07/02/2024)   Depression (PHQ2-9)    PHQ-2 Score: 0  Alcohol Screen: Not on file  Housing: Unknown  (10/09/2024)   Epic    Unable to Pay for Housing in the Last Year: No    Number of Times Moved in the Last Year: Not on file    Homeless in the Last Year: No  Utilities: Not on file  Health Literacy: Not on file   Past Surgical History:  Procedure Laterality Date   feet surgery Bilateral    HEMORRHOID SURGERY N/A 01/22/2018   Procedure: HEMORRHOIDECTOMY SINGLE COLUMN;  Surgeon: Debby Hila, MD;  Location: [facility];  Service: General;  Laterality: N/A;  HIP SURGERY Bilateral yrs ago   KNEE SURGERY Bilateral    REMOVAL OF DRUG DELIVERY IMPLANT Left 03/08/2021   Procedure: REMOVAL OF DRUG DELIVERY IMPLANT (NEXPLANON ) FROM  LEFT UPPER ARM;  Surgeon: Corene Coy, MD;  Location: [facility];  Service: Gynecology;  Laterality: Left;   right wrist surgery     cannot bend at wrist   ROBOTIC ASSISTED TOTAL HYSTERECTOMY Bilateral 03/08/2021   Procedure: XI ROBOTIC ASSISTED TOTAL HYSTERECTOMY WITH SALPINGECTOMY, CYSTOSCOPY;  Surgeon: Corene Coy, MD;  Location: [facility];  Service: Gynecology;  Laterality: Bilateral;   Past Surgical History:  Procedure Laterality Date   feet surgery Bilateral    HEMORRHOID SURGERY N/A 01/22/2018   Procedure: HEMORRHOIDECTOMY SINGLE COLUMN;  Surgeon: Debby Hila, MD;  Location: [facility];  Service: General;  Laterality: N/A;   HIP SURGERY Bilateral yrs ago   KNEE SURGERY Bilateral    REMOVAL OF DRUG DELIVERY IMPLANT Left 03/08/2021   Procedure: REMOVAL OF DRUG DELIVERY IMPLANT (NEXPLANON ) FROM  LEFT UPPER ARM;  Surgeon: Corene Coy, MD;  Location: [facility];  Service: Gynecology;  Laterality: Left;   right wrist surgery     cannot bend at wrist   ROBOTIC ASSISTED TOTAL HYSTERECTOMY Bilateral 03/08/2021   Procedure: XI ROBOTIC ASSISTED TOTAL HYSTERECTOMY WITH SALPINGECTOMY, CYSTOSCOPY;  Surgeon: Corene Coy, MD;  Location: [facility];  Service: Gynecology;   Laterality: Bilateral;   Past Medical History:  Diagnosis Date   Cerebral palsy (HCC)    legs stiff do not bend   Eczema    comes and goes per mother  on neck at times uses cocoa butter prn right neck drying out now per mother   Seizures (HCC)    Childhood seizures - not since age 68   Uses walker    in home for ambulation, uses wheelchair for distance   BP 125/77 (BP Location: Left Arm, Patient Position: Sitting, Cuff Size: Normal)   Pulse 100   Ht 4' 11 (1.499 m)   Wt 119 lb (54 kg)   LMP 02/15/2021   SpO2 95%   BMI 24.04 kg/m   Opioid Risk Score:   Fall Risk Score:  `1  Depression screen [facility] 2/9     07/02/2024    9:53 AM 06/02/2024   10:46 AM 10/01/2023    9:03 AM 05/23/2023   11:06 AM 02/28/2023    1:02 PM 07/03/2022    1:43 PM 11/17/2020    5:12 PM  Depression screen PHQ 2/9  Decreased Interest 0 0 0 0 1 0 0  Down, Depressed, Hopeless 0 0 0 0 0 0 0  PHQ - 2 Score 0 0 0 0 1 0 0  Altered sleeping     2  0  Tired, decreased energy     0  0  Change in appetite     0  0  Feeling bad or failure about yourself      0  0  Trouble concentrating     0  0  Moving slowly or fidgety/restless     0  0  Suicidal thoughts     0  0  PHQ-9 Score     3   0      Data saved with a previous flowsheet row definition      Review of Systems     Objective:   Physical Exam   PE: Constitution: Appropriate appearance for age. No apparent distress  Resp: No  respiratory distress. No accessory muscle usage. on RA and CTAB Cardio: Well perfused appearance. No peripheral edema. Abdomen: Nondistended. Nontender.   Psych: Appropriate mood and affect. Neuro: AAOx4. delayed responses; appropriate content.    Neurologic Exam:   + Bilateral rocker bottom feet Sensory exam: revealed  with reduced sensation to light touch in bilateral lower extremities  Motor exam: strength 5/5 throughout bilateral upper extremities and with exception of 2+/5 strength RLE hip flexion, knee extension,  plantarflexion; 2-/5 LLE hip flexion, knee extension, plantarflexion; 1/5 active dorsiflexion b/l --unchanged 12/15 Coordination: Fine motor coordination in BL UE WNL  Gait: Wheelchair ; watched video of gait 11/3 and 12/3  -gait 11/3 prior to injections: Stiff, forward leaning on walker, quad weakness with stiff knees and circumduction BL LE  - Gait 12/3 improved knee flexion and RLE; continues to drag LLE forward with overflexed hip, unable to bend L knee Tone: MAS 3 R 3-4 L knee extensors, MAS 3 R adductor, MAS 3 L adductor; MAS 1-2 bilateral plantarflexors        Assessment & Plan:   Iveliz Garay is a 33 y.o. year old female  who  has a past medical history of Cerebral palsy (HCC), Eczema, Seizures (HCC), and Uses walker.   They are presenting to PM&R clinic for spastic diplegic CP  Assessment and Plan    Spastic diplegic cerebral palsy Chronic spasticity affecting lower extremities. Botox  improved right leg mobility; left leg remains stiff. Insurance denied current Botox  dosage, appeal needed. Tizanidine  effective at night without drowsiness. Dantrolene  considered for spasticity, requires liver monitoring. - Continue current Botox  dosage pending appeal. - Continue tizanidine  at night - helps with sleep but does cause lethargy  - Failed baclofen  due to GI side effects - Initiate dantrolene  25 mg once daily for one week, then perform liver function tests. - If liver function tests normal, increase dantrolene  to three times daily. - Perform liver function tests after one month of increased dantrolene  dosage. - Monitor mobility and adjust treatment as needed.  Spasticity Spasticity affects lower extremities, left more than right. Managed with Botox  and tizanidine . Dantrolene  introduced, requires liver monitoring. - Continue tizanidine  at night. - Initiated dantrolene  25 mg once daily for one week, then performed liver function tests. - If liver function tests normal, increased  dantrolene  to three times daily. - Performed liver function tests after one month of increased dantrolene  dosage.  - Will adjust injections next time to increase L iliopsoas and knee extensors - Will target proximal muscles is dose adjustment needed   Impaired mobility and activities of daily living (ADLs) Mobility improved with physical therapy and walker. Left leg spasticity affects walking and ADLs. New shoes and braces beneficial. Home therapy exercises ongoing. - Continue physical therapy exercises at home. - Use walker and braces as needed. - Monitor mobility and adjust therapy as needed.

## 2024-11-18 ENCOUNTER — Telehealth: Payer: Self-pay

## 2024-11-18 NOTE — Telephone Encounter (Signed)
 Noted. Once the order is received I will make sure it gets signed.

## 2024-11-18 NOTE — Telephone Encounter (Signed)
 FYI - I had appealed insurance denial for botox  800 u; family is now telling me she is going to be on just Medicaid in January, so we need to see if that will effect her coverage. Thank you!

## 2024-11-18 NOTE — Telephone Encounter (Signed)
 Copied from CRM #[id number]. Topic: Clinical - Medical Advice >> Nov 18, 2024 10:44 AM Charolett L wrote: Reason for CRM: Selinda from [employer] stated that they are refaxing the order for a gate trainer

## 2024-12-01 ENCOUNTER — Other Ambulatory Visit (HOSPITAL_COMMUNITY)
Admission: RE | Admit: 2024-12-01 | Discharge: 2024-12-01 | Disposition: A | Discharge status: Home / Self Care | Source: Ambulatory Visit | Attending: Physical Medicine and Rehabilitation | Admitting: Physical Medicine and Rehabilitation

## 2024-12-01 DIAGNOSIS — R252 Cramp and spasm: Secondary | ICD-10-CM | POA: Diagnosis present

## 2024-12-01 LAB — COMPREHENSIVE METABOLIC PANEL WITH GFR
ALT: 12 U/L (ref 0–44)
AST: 16 U/L (ref 15–41)
Albumin: 4.8 g/dL (ref 3.5–5.0)
Alkaline Phosphatase: 48 U/L (ref 38–126)
Anion gap: 13 (ref 5–15)
BUN: 16 mg/dL (ref 6–20)
CO2: 20 mmol/L — ABNORMAL LOW (ref 22–32)
Calcium: 9.5 mg/dL (ref 8.9–10.3)
Chloride: 107 mmol/L (ref 98–111)
Creatinine, Ser: 0.53 mg/dL (ref 0.44–1.00)
GFR, Estimated: >60 mL/min
Glucose, Bld: 100 mg/dL — ABNORMAL HIGH (ref 70–99)
Potassium: 3.8 mmol/L (ref 3.5–5.1)
Sodium: 140 mmol/L (ref 135–145)
Total Bilirubin: 0.5 mg/dL (ref 0.0–1.2)
Total Protein: 7.7 g/dL (ref 6.5–8.1)

## 2024-12-03 ENCOUNTER — Telehealth: Payer: Self-pay

## 2024-12-03 NOTE — Telephone Encounter (Signed)
 Copied from CRM #[id number]. Topic: Clinical - Medical Advice >> Nov 18, 2024 10:44 AM Charolett L wrote: Reason for CRM: Selinda from [employer] [facility] [employer] stated that they are refaxing the order for a gate trainer >> Dec 03, 2024 10:36 AM Revonda D wrote: [employer] is calling to confirm if the office received and signed the forms that were re faxed on 12/16. He would also like a callback with an update today if possible. CB [phone].

## 2024-12-05 ENCOUNTER — Ambulatory Visit: Payer: Self-pay | Admitting: Physical Medicine and Rehabilitation

## 2024-12-05 NOTE — Telephone Encounter (Signed)
 Unable to reach Kerri Harris. LMTRC

## 2024-12-10 NOTE — Telephone Encounter (Signed)
 Unable to reach Marshall. LMTRC

## 2024-12-11 NOTE — Telephone Encounter (Signed)
 Copied from CRM #[id number]. Topic: Clinical - Medical Advice >> Nov 18, 2024 10:44 AM Charolett L wrote: Reason for CRM: Selinda from [facility] [employer] stated that they are refaxing the order for a gate trainer  >> Dec 11, 2024 12:07 PM China J wrote: Reyes calling from [employer] in regards to the forms they had sent over. I let him know that our CMA jazunique was trying to get a hold of a gentleman named Selinda but she could not get through. He would like if she could call him on his direct line instead at: [phone]  >> Dec 03, 2024 10:36 AM Revonda D wrote: [employer] is calling to confirm if the office received and signed the forms that were re faxed on 12/16. He would also like a callback with an update today if possible. CB [phone].

## 2024-12-11 NOTE — Telephone Encounter (Signed)
 Spoke with Kerri Harris and he advised me that he will refax the information to have her gait belt repaired. I advised him that I probably wasn't received the paperwork because he was putting attention to a NP that doesn't work at our office. So he is going to revise the paperwork and fax it.

## 2024-12-15 NOTE — Telephone Encounter (Signed)
 Paperwork has been completed and faxed

## 2024-12-29 ENCOUNTER — Encounter: Admitting: Physical Medicine and Rehabilitation

## 2025-01-26 ENCOUNTER — Encounter: Payer: MEDICAID | Admitting: Physical Medicine and Rehabilitation
